# Patient Record
Sex: Male | Born: 1960 | Race: Black or African American | Hispanic: No | Marital: Married | Smoking: Never smoker
Health system: Southern US, Community
[De-identification: ages and names within clinical notes are randomized; demographics above are authoritative.]

## PROBLEM LIST (undated history)

## (undated) DIAGNOSIS — D649 Anemia, unspecified: Secondary | ICD-10-CM

## (undated) DIAGNOSIS — M1990 Unspecified osteoarthritis, unspecified site: Secondary | ICD-10-CM

## (undated) DIAGNOSIS — R000 Tachycardia, unspecified: Secondary | ICD-10-CM

## (undated) DIAGNOSIS — I639 Cerebral infarction, unspecified: Secondary | ICD-10-CM

## (undated) HISTORY — DX: Cerebral infarction, unspecified: I63.9

## (undated) HISTORY — PX: FINGER AMPUTATION: SHX636

## (undated) HISTORY — PX: SHOULDER SURGERY: SHX246

---

## 2003-11-15 ENCOUNTER — Encounter: Admission: RE | Admit: 2003-11-15 | Discharge: 2003-11-15 | Payer: Self-pay | Admitting: Family Medicine

## 2010-01-03 ENCOUNTER — Encounter: Admission: RE | Admit: 2010-01-03 | Discharge: 2010-01-03 | Payer: Self-pay | Admitting: Internal Medicine

## 2012-02-27 ENCOUNTER — Telehealth: Payer: Self-pay | Admitting: Oncology

## 2012-02-27 NOTE — Telephone Encounter (Signed)
S/W pt in re NP appt 11/07 @ 1:30 w/ Dr. Clelia Croft Referring Dr. Nehemiah Settle Dx-Eosinophils still elevated Welcome packet mailed.

## 2012-02-28 ENCOUNTER — Other Ambulatory Visit: Payer: Self-pay | Admitting: Oncology

## 2012-02-28 ENCOUNTER — Telehealth: Payer: Self-pay | Admitting: Oncology

## 2012-02-28 DIAGNOSIS — D72818 Other decreased white blood cell count: Secondary | ICD-10-CM

## 2012-02-28 DIAGNOSIS — D709 Neutropenia, unspecified: Secondary | ICD-10-CM

## 2012-02-28 NOTE — Telephone Encounter (Signed)
C/D 02/28/12 for appt 03/05/12

## 2012-03-05 ENCOUNTER — Other Ambulatory Visit (HOSPITAL_BASED_OUTPATIENT_CLINIC_OR_DEPARTMENT_OTHER): Payer: 59 | Admitting: Lab

## 2012-03-05 ENCOUNTER — Encounter: Payer: Self-pay | Admitting: Oncology

## 2012-03-05 ENCOUNTER — Ambulatory Visit: Payer: 59

## 2012-03-05 ENCOUNTER — Ambulatory Visit (HOSPITAL_BASED_OUTPATIENT_CLINIC_OR_DEPARTMENT_OTHER): Payer: 59 | Admitting: Oncology

## 2012-03-05 ENCOUNTER — Telehealth: Payer: Self-pay | Admitting: Oncology

## 2012-03-05 VITALS — BP 135/85 | HR 82 | Temp 98.3°F | Resp 20 | Ht 70.0 in | Wt 200.8 lb

## 2012-03-05 DIAGNOSIS — D721 Eosinophilia, unspecified: Secondary | ICD-10-CM

## 2012-03-05 DIAGNOSIS — D709 Neutropenia, unspecified: Secondary | ICD-10-CM

## 2012-03-05 DIAGNOSIS — D72818 Other decreased white blood cell count: Secondary | ICD-10-CM

## 2012-03-05 LAB — CBC WITH DIFFERENTIAL/PLATELET
BASO%: 0.7 % (ref 0.0–2.0)
Eosinophils Absolute: 1 10*3/uL — ABNORMAL HIGH (ref 0.0–0.5)
HCT: 42.8 % (ref 38.4–49.9)
MCH: 28.4 pg (ref 27.2–33.4)
MCV: 85.9 fL (ref 79.3–98.0)
RBC: 4.99 10*6/uL (ref 4.20–5.82)
lymph#: 1.8 10*3/uL (ref 0.9–3.3)

## 2012-03-05 LAB — COMPREHENSIVE METABOLIC PANEL (CC13)
ALT: 37 U/L (ref 0–55)
AST: 33 U/L (ref 5–34)
Alkaline Phosphatase: 62 U/L (ref 40–150)
BUN: 13 mg/dL (ref 7.0–26.0)
CO2: 28 mEq/L (ref 22–29)
Calcium: 9.6 mg/dL (ref 8.4–10.4)
Chloride: 104 mEq/L (ref 98–107)
Glucose: 79 mg/dl (ref 70–99)
Sodium: 137 mEq/L (ref 136–145)
Total Protein: 7.4 g/dL (ref 6.4–8.3)

## 2012-03-05 LAB — CHCC SMEAR

## 2012-03-05 NOTE — Progress Notes (Signed)
Note dictated

## 2012-03-05 NOTE — Progress Notes (Signed)
Checked in new pt with no financial concerns. °

## 2012-03-05 NOTE — Telephone Encounter (Signed)
gv and printed appt schedule for Feb 2014 ° °

## 2012-03-06 NOTE — Progress Notes (Signed)
CC:   Darrell Watson. Polite, M.D.  REASON FOR CONSULTATION:  Eosinophilia.  HISTORY OF PRESENT ILLNESS:  Darrell Watson is a pleasant 51 year old gentleman currently of [employer], originally from [place], [place].  He is a healthy gentleman without any significant past medical history.  He does have a history of osteoarthritis and had some left shoulder surgery.  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  He gets his routine care at Dr. Idelle Crouch office and he had been getting routine CBCs on an annual basis.  His blood counts have been relatively normal and presented on January 31, 2012, with an elevated eosinophil percentage up to 20% and his absolute eosinophil count was 1400.  A repeat CBC back on 02/25/2012 showed his eosinophil percentage was down slightly to 17.9, absolute eosinophil count of 1100.  His total white cell count was 6.3, hemoglobin was 13.9, platelet count was 247, all normal.  He did not recall any symptoms leading up to this, did not report any allergy symptoms, did not report any certain exposure to certain chemicals or new drugs.  He denied any over-the-counter drugs, he only takes ibuprofen.  He does take protein supplement for weight lifting purposes.  For the most part, he is really asymptomatic.  Does report some fatigue, but he is able to work out, exercise regularly, and perform most activities of daily living without any hindrance or decline.  REVIEW OF SYSTEMS:  Does not report any headaches, blurry vision, double vision.  Does not report any motor or sensory neuropathy.  Does not report any alteration in mental status.  Does not report any psychiatric issues, depression.  Does not report any fever, chills, sweats.  Does not report any cough, hemoptysis, hematemesis.  No nausea or vomiting. Does not report any  abdominal pain, hematochezia, melena.  Rest of review of systems is unremarkable.  PAST MEDICAL HISTORY:  As mentioned, he has  history of osteoarthritis, TMJ.  No history of hypertension, diabetes, or heart disease.  He has had shoulder surgery for osteoarthritis.  MEDICATIONS:  Takes ibuprofen, no other medications or over-the-counter supplements.  ALLERGIES:  None.  FAMILY HISTORY:  His father is deceased.  His mother had hypertension, she is also deceased.  There is history of hypertension that runs in his family.  SOCIAL HISTORY:  He is married.  He has 2 children.  He denied any alcohol or tobacco abuse.  Served in the [employer] for a few years. Works at [employer] and [employer].  PHYSICAL EXAMINATION:  General:  Alert, awake, pleasant gentleman, appeared in no active distress.  Vital Signs:  Blood pressure today is 135/85, pulse 82, respirations 20, temperature is 98.  HEENT:  Head is normocephalic, atraumatic.  Pupils equal, round, and reactive to light. Oral mucosa moist and pink.  Neck:  Supple.  No lymphadenopathy.  Heart: Regular rate and rhythm.  S1 and S2.  Lungs:  Clear to auscultation.  No rhonchi, wheeze, or dullness to percussion.  Abdomen:  Soft, nontender. No hepatosplenomegaly.  Extremities:  No clubbing, cyanosis, or edema. Neurologic:  Intact motor, sensory, and deep tendon reflexes.  LABORATORY DATA:  Showed a hemoglobin of 14.2, white cell count of 6.0, platelet count of 236.  Eosinophil percentage is down to 15% and the absolute eosinophil count was 1000, upper limit of normal is 500. Peripheral smear was personally reviewed today and did not really show any evidence of any leukocytosis, there is no evidence of any schistocytosis or red cell fragmentation.  Platelets  appeared normal. There is no evidence of any leukemic blasts.  Slightly increased eosinophils.  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  The differential diagnosis discussed today in detail with Mr. Mahler.  I feel that he has rather mild eosinophilia that appears to be improving  in the last month.  Causes of his eosinophilia were discussed today.  Most likely this represents a secondary or reactive process.  Allergy to certain environmental exposure, drugs, supplements could be certainly causing this parasitic infection.  I doubt that he has autoimmune disorder or endocrine disorder or any occult malignancy. The fact that his eosinophil count is declining is more encouraging.  I did encourage him to continue with his age-appropriate cancer screening which appears to be up-to-date.  I do not recommend any further workup at this time.  I would like to recheck his blood count in about 4 months and if his eosinophilia has resolved, the I do not think any further workup is needed.  All his questions were answered today.    ______________________________ Benjiman Core, M.D. FNS/MEDQ  D:  03/05/2012  T:  03/06/2012  Job:  [id number]

## 2012-06-11 ENCOUNTER — Other Ambulatory Visit: Payer: 59 | Admitting: Lab

## 2012-06-11 ENCOUNTER — Ambulatory Visit: Payer: 59 | Admitting: Oncology

## 2012-06-12 ENCOUNTER — Telehealth: Payer: Self-pay | Admitting: Oncology

## 2012-06-12 NOTE — Telephone Encounter (Signed)
pt caleld to r/s missed appt.Marland KitchenMarland KitchenMarland KitchenDone

## 2012-07-28 ENCOUNTER — Ambulatory Visit (HOSPITAL_BASED_OUTPATIENT_CLINIC_OR_DEPARTMENT_OTHER): Payer: 59 | Admitting: Oncology

## 2012-07-28 ENCOUNTER — Other Ambulatory Visit (HOSPITAL_BASED_OUTPATIENT_CLINIC_OR_DEPARTMENT_OTHER): Payer: 59 | Admitting: Lab

## 2012-07-28 VITALS — BP 136/79 | HR 97 | Temp 98.3°F | Resp 18 | Ht 70.0 in | Wt 205.6 lb

## 2012-07-28 DIAGNOSIS — D721 Eosinophilia, unspecified: Secondary | ICD-10-CM

## 2012-07-28 LAB — CBC WITH DIFFERENTIAL/PLATELET
BASO%: 1.1 % (ref 0.0–2.0)
Eosinophils Absolute: 0.6 10*3/uL — ABNORMAL HIGH (ref 0.0–0.5)
HCT: 41.6 % (ref 38.4–49.9)
HGB: 13.3 g/dL (ref 13.0–17.1)
MONO#: 0.6 10*3/uL (ref 0.1–0.9)
MONO%: 11.5 % (ref 0.0–14.0)
RBC: 4.96 10*6/uL (ref 4.20–5.82)
RDW: 14 % (ref 11.0–14.6)
lymph#: 1.4 10*3/uL (ref 0.9–3.3)

## 2012-07-28 NOTE — Progress Notes (Signed)
Hematology and Oncology Follow Up Visit  Darrell Watson [id number] 1960-10-03 52 y.o. 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 Likely reactive due to allergy. His eosinophil count was at 1000.  Current therapy: Observation and follow up.  Interim History:  Darrell Watson presents today for a follow up visit. He is a nice man I saw on 02/2012 for mild eosinophilia. Since his last visit, he has not reported any new symptoms or hospitalizations. He report allergies to variety of items including smells, animals and sometimes seasonal allergies. He has not reported any recent illness or hospitalizations. He denied any over-the-counter drugs, he only takes ibuprofen. He does take protein supplement for weight lifting purposes. For the most part, he is really asymptomatic. Does report some fatigue, but he is able to work out, exercise regularly, and perform most activities of daily living without any hindrance or decline.   Medications: I have reviewed the patient's current medications. Current outpatient prescriptions:ibuprofen (ADVIL,MOTRIN) 200 MG tablet, Take 400 mg by mouth as needed., Disp: , Rfl:   Allergies: No Known Allergies  Past Medical History, Surgical history, Social history, and Family History were reviewed and updated.  Review of Systems: Constitutional:  Negative for fever, chills, night sweats, anorexia, weight loss, pain. Cardiovascular: no chest pain or dyspnea on exertion Respiratory: negative Neurological: negative Dermatological: negative ENT: negative Skin: Negative. Gastrointestinal: negative Genito-Urinary: negative Hematological and Lymphatic: negative Breast: negative Musculoskeletal: negative Remaining ROS negative. Physical Exam: Blood pressure 136/79, pulse 97, temperature 98.3 F (36.8 C), temperature source Oral, resp. rate 18, height 5\' 10"  (1.778 m), weight 205 lb 9.6 oz (93.26 kg). ECOG:  General  appearance: alert Head: Normocephalic, without obvious abnormality, atraumatic Neck: no adenopathy, no carotid bruit, no JVD, supple, symmetrical, trachea midline and thyroid not enlarged, symmetric, no tenderness/mass/nodules Lymph nodes: Cervical, supraclavicular, and axillary nodes normal. Heart:regular rate and rhythm, S1, S2 normal, no murmur, click, rub or gallop Lung:chest clear, no wheezing, rales, normal symmetric air entry Abdomin: soft, non-tender, without masses or organomegaly EXT:no erythema, induration, or nodules   Lab Results: Lab Results  Component Value Date   WBC 5.5 07/28/2012   HGB 13.3 07/28/2012   HCT 41.6 07/28/2012   MCV 83.9 07/28/2012   PLT 265 07/28/2012     Chemistry      Component Value Date/Time   NA 137 03/05/2012 1351   K 4.0 03/05/2012 1351   CL 104 03/05/2012 1351   CO2 28 03/05/2012 1351   BUN 13.0 03/05/2012 1351   CREATININE 1.4* 03/05/2012 1351      Component Value Date/Time   CALCIUM 9.6 03/05/2012 1351   ALKPHOS 62 03/05/2012 1351   AST 33 03/05/2012 1351   ALT 37 03/05/2012 1351   BILITOT 0.47 03/05/2012 1351         Impression and Plan:  52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 Likely reactive due to allergy. His eosinophil count was at 1000. His count today is down to 600 (normal is 500).  At this time I see no need for further work up or intervention. I doubt this is due to primary causes or a myeloproliferative disorder. Given that his count is down compared to last visit, makes these conditions less likely.   I would recommend repeating his CBC with his physical yearly. And will be happy to see him back as needed.   Eli Hose, MD 4/1/20142:39 PM

## 2014-09-05 ENCOUNTER — Ambulatory Visit
Admission: RE | Admit: 2014-09-05 | Discharge: 2014-09-05 | Disposition: A | Discharge status: Home / Self Care | Payer: 59 | Source: Ambulatory Visit | Attending: Internal Medicine | Admitting: Internal Medicine

## 2014-09-05 ENCOUNTER — Other Ambulatory Visit: Payer: Self-pay | Admitting: Internal Medicine

## 2014-09-05 DIAGNOSIS — R5383 Other fatigue: Secondary | ICD-10-CM

## 2014-09-05 DIAGNOSIS — L989 Disorder of the skin and subcutaneous tissue, unspecified: Secondary | ICD-10-CM

## 2015-03-30 ENCOUNTER — Ambulatory Visit: Payer: Self-pay

## 2015-03-30 ENCOUNTER — Other Ambulatory Visit: Payer: Self-pay | Admitting: Occupational Medicine

## 2015-03-30 DIAGNOSIS — Z0000 Encounter for general adult medical examination without abnormal findings: Secondary | ICD-10-CM

## 2015-10-10 ENCOUNTER — Other Ambulatory Visit: Payer: Self-pay | Admitting: Internal Medicine

## 2015-10-10 DIAGNOSIS — R29898 Other symptoms and signs involving the musculoskeletal system: Secondary | ICD-10-CM

## 2015-10-13 ENCOUNTER — Ambulatory Visit
Admission: RE | Admit: 2015-10-13 | Discharge: 2015-10-13 | Disposition: A | Discharge status: Home / Self Care | Payer: 59 | Source: Ambulatory Visit | Attending: Internal Medicine | Admitting: Internal Medicine

## 2015-10-13 DIAGNOSIS — R29898 Other symptoms and signs involving the musculoskeletal system: Secondary | ICD-10-CM

## 2015-11-03 ENCOUNTER — Other Ambulatory Visit (HOSPITAL_COMMUNITY): Payer: Self-pay | Admitting: Internal Medicine

## 2015-11-03 DIAGNOSIS — R9389 Abnormal findings on diagnostic imaging of other specified body structures: Secondary | ICD-10-CM

## 2015-11-08 ENCOUNTER — Encounter (HOSPITAL_COMMUNITY)
Admission: RE | Admit: 2015-11-08 | Discharge: 2015-11-08 | Disposition: A | Discharge status: Home / Self Care | Payer: 59 | Source: Ambulatory Visit | Attending: Internal Medicine | Admitting: Internal Medicine

## 2015-11-08 DIAGNOSIS — R938 Abnormal findings on diagnostic imaging of other specified body structures: Secondary | ICD-10-CM | POA: Insufficient documentation

## 2015-11-08 DIAGNOSIS — R9389 Abnormal findings on diagnostic imaging of other specified body structures: Secondary | ICD-10-CM

## 2015-11-08 MED ORDER — TECHNETIUM TC 99M MEDRONATE IV KIT
25.0000 | PACK | Freq: Once | INTRAVENOUS | Status: AC | PRN
  Administered 2015-11-08: 25 via INTRAVENOUS

## 2016-05-07 DIAGNOSIS — E291 Testicular hypofunction: Secondary | ICD-10-CM | POA: Diagnosis not present

## 2016-05-10 DIAGNOSIS — G8929 Other chronic pain: Secondary | ICD-10-CM | POA: Diagnosis not present

## 2016-05-10 DIAGNOSIS — M21372 Foot drop, left foot: Secondary | ICD-10-CM | POA: Diagnosis not present

## 2016-05-10 DIAGNOSIS — M5442 Lumbago with sciatica, left side: Secondary | ICD-10-CM | POA: Diagnosis not present

## 2016-05-16 DIAGNOSIS — M5442 Lumbago with sciatica, left side: Secondary | ICD-10-CM | POA: Diagnosis not present

## 2016-05-16 DIAGNOSIS — G8929 Other chronic pain: Secondary | ICD-10-CM | POA: Diagnosis not present

## 2016-05-21 DIAGNOSIS — M5442 Lumbago with sciatica, left side: Secondary | ICD-10-CM | POA: Diagnosis not present

## 2016-05-21 DIAGNOSIS — M21372 Foot drop, left foot: Secondary | ICD-10-CM | POA: Diagnosis not present

## 2016-05-21 DIAGNOSIS — G8929 Other chronic pain: Secondary | ICD-10-CM | POA: Diagnosis not present

## 2016-05-24 DIAGNOSIS — E291 Testicular hypofunction: Secondary | ICD-10-CM | POA: Diagnosis not present

## 2016-06-07 DIAGNOSIS — E291 Testicular hypofunction: Secondary | ICD-10-CM | POA: Diagnosis not present

## 2016-06-10 ENCOUNTER — Ambulatory Visit: Payer: Self-pay | Admitting: Physician Assistant

## 2016-06-12 ENCOUNTER — Other Ambulatory Visit: Payer: Self-pay

## 2016-06-19 ENCOUNTER — Ambulatory Visit (INDEPENDENT_AMBULATORY_CARE_PROVIDER_SITE_OTHER): Payer: 59 | Admitting: Vascular Surgery

## 2016-06-19 ENCOUNTER — Encounter: Payer: Self-pay | Admitting: Vascular Surgery

## 2016-06-19 VITALS — BP 140/92 | HR 108 | Temp 98.2°F | Resp 20 | Ht 70.0 in | Wt 196.0 lb

## 2016-06-19 DIAGNOSIS — M5137 Other intervertebral disc degeneration, lumbosacral region: Secondary | ICD-10-CM | POA: Diagnosis not present

## 2016-06-19 NOTE — Pre-Procedure Instructions (Signed)
Darrell CooleyOllis M Badolato  06/19/2016      [facility] [id number] - [place] - [address] [place] [id number] Phone: [phone] Fax: [phone]    Your procedure is scheduled on March 1  [facility] Admitting at 0530 A.M.  Call this number if you have problems the morning of surgery:  [phone]   Remember:  Do not eat food or drink liquids after midnight.   Take these medicines the morning of surgery with A SIP OF WATER acetaminophen (TYLENOL)   7 days prior to surgery STOP taking any Aspirin, Aleve, Naproxen, Ibuprofen, Motrin, Advil, Goody's, BC's, all herbal medications, fish oil, and all vitamins    Do not wear jewelry.  Do not wear lotions, powders, or cologne, or deoderant.  Men may shave face and neck.  Do not bring valuables to the hospital.  [facility] is not responsible for any belongings or valuables.  Contacts, dentures or bridgework may not be worn into surgery.  Leave your suitcase in the car.  After surgery it may be brought to your room.  For patients admitted to the hospital, discharge time will be determined by your treatment team.  Patients discharged the day of surgery will not be allowed to drive home.    Special instructions:   [facility]- Preparing For Surgery  Before surgery, you can play an important role. Because skin is not sterile, your skin needs to be as free of germs as possible. You can reduce the number of germs on your skin by washing with CHG (chlorahexidine gluconate) Soap before surgery.  CHG is an antiseptic cleaner which kills germs and bonds with the skin to continue killing germs even after washing.  Please do not use if you have an allergy to CHG or antibacterial soaps. If your skin becomes reddened/irritated stop using the CHG.  Do not shave (including legs and underarms) for at least 48 hours prior to first CHG shower. It is OK to shave  your face.  Please follow these instructions carefully.   1. Shower the NIGHT BEFORE SURGERY and the MORNING OF SURGERY with CHG.   2. If you chose to wash your hair, wash your hair first as usual with your normal shampoo.  3. After you shampoo, rinse your hair and body thoroughly to remove the shampoo.  4. Use CHG as you would any other liquid soap. You can apply CHG directly to the skin and wash gently with a scrungie or a clean washcloth.   5. Apply the CHG Soap to your body ONLY FROM THE NECK DOWN.  Do not use on open wounds or open sores. Avoid contact with your eyes, ears, mouth and genitals (private parts). Wash genitals (private parts) with your normal soap.  6. Wash thoroughly, paying special attention to the area where your surgery will be performed.  7. Thoroughly rinse your body with warm water from the neck down.  8. DO NOT shower/wash with your normal soap after using and rinsing off the CHG Soap.  9. Pat yourself dry with a CLEAN TOWEL.   10. Wear CLEAN PAJAMAS   11. Place CLEAN SHEETS on your bed the night of your first shower and DO NOT SLEEP WITH PETS.    Day of Surgery: Do not apply any deodorants/lotions. Please wear clean clothes to the hospital/surgery center.      Please read over the  following fact sheets that you were given.

## 2016-06-19 NOTE — Progress Notes (Signed)
Patient name: Darrell Watson MRN: [id number] DOB: October 22, 1960 Sex: male  REASON FOR CONSULT: Evaluate for anterior peritoneal exposure of L5-S1. Referred by Dr. Shon Baton  HPI: Darrell Watson is a 56 y.o. male, with a long history of back pain. He has significant pain which is aggravated by activity and relieved somewhat with rest. 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 He has failed conservative treatment and is felt to be a good candidate for anterior lumbar interbody fusion of L5-S1. We were asked to provide anterior retroperitoneal exposure of that level.  History reviewed. No pertinent past medical history.  History reviewed. No pertinent family history.  SOCIAL HISTORY: Social History   Social History  . Marital status: Married    Spouse name: N/A  . Number of children: N/A  . Years of education: N/A   Occupational History  . Not on file.   Social History Main Topics  . Smoking status: Never Smoker  . Smokeless tobacco: Never Used  . Alcohol use No  . Drug use: No  . Sexual activity: Not on file   Other Topics Concern  . Not on file   Social History Narrative  . No narrative on file    No Known Allergies  Current Outpatient Prescriptions  Medication Sig Dispense Refill  . acetaminophen (TYLENOL) 325 MG tablet Take 650 mg by mouth every 6 (six) hours as needed (for pain.).    Marland Kitchen testosterone cypionate (DEPOTESTOSTERONE CYPIONATE) 200 MG/ML injection Inject 300 mg into the muscle every 14 (fourteen) days. Gets 1.5 ml at Dr office every 2 weeks Next dose is due 06-24-2016     No current facility-administered medications for this visit.     REVIEW OF SYSTEMS:  [X]  denotes positive finding, [ ]  denotes negative finding Cardiac  Comments:  Chest pain or chest pressure:    Shortness of breath upon exertion:    Short of breath when lying flat:    Irregular heart rhythm:        Vascular    Pain in calf, thigh, or hip brought on by ambulation:      Pain in feet at night that wakes you up from your sleep:     Blood clot in your veins:    Leg swelling:         Pulmonary    Oxygen at home:    Productive cough:     Wheezing:         Neurologic    Sudden weakness in arms or legs:     Sudden numbness in arms or legs:     Sudden onset of difficulty speaking or slurred speech:    Temporary loss of vision in one eye:     Problems with dizziness:         Gastrointestinal    Blood in stool:     Vomited blood:         Genitourinary    Burning when urinating:     Blood in urine:        Psychiatric    Major depression:         Hematologic    Bleeding problems:    Problems with blood clotting too easily:        Skin    Rashes or ulcers:        Constitutional    Fever or chills:      PHYSICAL EXAM: Vitals:   06/19/16 1320  BP: (!) 140/92  Pulse: (!) 108  Resp: 20  Temp: 98.2 F (36.8 C)  TempSrc: Oral  SpO2: 97%  Weight: 196 lb (88.9 kg)  Height: 5\' 10"  (1.778 m)    GENERAL: The patient is a well-nourished male, in no acute distress. The vital signs are documented above. CARDIAC: There is a regular rate and rhythm.  VASCULAR: I do not detect carotid bruits. He has palpable femoral and posterior tibial pulses bilaterally. PULMONARY: There is good air exchange bilaterally without wheezing or rales. ABDOMEN: Soft and non-tender with normal pitched bowel sounds.  MUSCULOSKELETAL: There are no major deformities or cyanosis. NEUROLOGIC: No focal weakness or paresthesias are detected. SKIN: There are no ulcers or rashes noted. PSYCHIATRIC: The patient has a normal affect.  DATA:   I have reviewed his MRI and I do not see any complicating factors anatomically at the L5-S1 level.  MEDICAL ISSUES:  DEGENERATIVE DISC DISEASE L5-S1: This patient has degenerative disease at L5-S1 and is scheduled for anterior lumbar interbody fusion. We have been asked to provide a retroperitoneal exposure of this level. He appears to  be a good candidate for this. I have reviewed our role in exposure of the spine in order to allow anterior lumbar interbody fusion at the appropriate levels. We have discussed the potential complications of surgery, including but not limited to, arterial or venous injury, thrombosis, or bleeding. We have also discussed the potential risks of wound healing problems, the development of a hernia, nerve injury, leg swelling, or other unpredictable medical problems. I've also explained that for the L5-S1 level there is a small risk of retrograde ejaculation. All the patient's questions were answered and they are agreeable to proceed. This surgery is scheduled for March 1.    Darrell Watson, Darrell Watson [facility] [place] [phone]

## 2016-06-20 ENCOUNTER — Encounter (HOSPITAL_COMMUNITY): Payer: Self-pay

## 2016-06-20 ENCOUNTER — Encounter (HOSPITAL_COMMUNITY)
Admission: RE | Admit: 2016-06-20 | Discharge: 2016-06-20 | Disposition: A | Discharge status: Home / Self Care | Payer: 59 | Source: Ambulatory Visit | Attending: Orthopedic Surgery | Admitting: Orthopedic Surgery

## 2016-06-20 DIAGNOSIS — R000 Tachycardia, unspecified: Secondary | ICD-10-CM | POA: Diagnosis not present

## 2016-06-20 DIAGNOSIS — Z01812 Encounter for preprocedural laboratory examination: Secondary | ICD-10-CM | POA: Diagnosis present

## 2016-06-20 DIAGNOSIS — M5136 Other intervertebral disc degeneration, lumbar region: Secondary | ICD-10-CM | POA: Insufficient documentation

## 2016-06-20 DIAGNOSIS — Z01810 Encounter for preprocedural cardiovascular examination: Secondary | ICD-10-CM | POA: Diagnosis present

## 2016-06-20 HISTORY — DX: Unspecified osteoarthritis, unspecified site: M19.90

## 2016-06-20 LAB — BASIC METABOLIC PANEL
ANION GAP: 9 (ref 5–15)
BUN: 7 mg/dL (ref 6–20)
CALCIUM: 9.4 mg/dL (ref 8.9–10.3)
CO2: 27 mmol/L (ref 22–32)
Chloride: 103 mmol/L (ref 101–111)
Creatinine, Ser: 1.37 mg/dL — ABNORMAL HIGH (ref 0.61–1.24)
GFR, EST NON AFRICAN AMERICAN: 56 mL/min — AB (ref >60)
GLUCOSE: 151 mg/dL — AB (ref 65–99)
POTASSIUM: 4.1 mmol/L (ref 3.5–5.1)
Sodium: 139 mmol/L (ref 135–145)

## 2016-06-20 LAB — CBC
HCT: 47 % (ref 39.0–52.0)
Hemoglobin: 15.5 g/dL (ref 13.0–17.0)
MCH: 28.6 pg (ref 26.0–34.0)
MCHC: 33 g/dL (ref 30.0–36.0)
MCV: 86.7 fL (ref 78.0–100.0)
PLATELETS: 240 10*3/uL (ref 150–400)
RBC: 5.42 MIL/uL (ref 4.22–5.81)
RDW: 13.1 % (ref 11.5–15.5)
WBC: 4.9 10*3/uL (ref 4.0–10.5)

## 2016-06-20 LAB — ABO/RH: ABO/RH(D): O POS

## 2016-06-20 LAB — TYPE AND SCREEN
ABO/RH(D): O POS
ANTIBODY SCREEN: NEGATIVE

## 2016-06-20 LAB — SURGICAL PCR SCREEN
MRSA, PCR: NEGATIVE
Staphylococcus aureus: NEGATIVE

## 2016-06-20 NOTE — Progress Notes (Signed)
ALLISON NOTIFIED OF PATIENT HR 117.  PATIENT STATES HIS HR IS ALWAYS LITTLE HIGH (101-104)- DENIES ANY SYMPTOMS AND STATED DR. POLITE IS AWARE.  PATIENT DENIES ANY CARDIAC TESTING.  ALLISON REVIEWED EKG DONE TODAY.  REQUESTED LAST EKG AND OFFICE NOTE FROM DR. POLITE .

## 2016-06-21 DIAGNOSIS — G8929 Other chronic pain: Secondary | ICD-10-CM | POA: Diagnosis not present

## 2016-06-21 DIAGNOSIS — M5442 Lumbago with sciatica, left side: Secondary | ICD-10-CM | POA: Diagnosis not present

## 2016-06-21 NOTE — Progress Notes (Signed)
Anesthesia Chart Review: Patient is a 56 year old male scheduled for ALIF L5-S1 on 06/27/16 by Dr. Shon BatonBrooks with abdominal exposure by Dr. Edilia Boickson.   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 Denied illicit drug use. Occasional beer.   BP (!) 146/80   Pulse (!) 117 Comment: notified Designer, jewelleryJenny RN  Temp 36.8 C   Resp 20   Ht 5\' 9"  (1.753 m)   Wt 195 lb 9.6 oz (88.7 kg)   SpO2 96%   BMI 28.89 kg/m  HR recheck 106. (He reports his HR stays ~ 100 bpm. It was 94 bpm at CPE on 03/07/16 and 108 bpm at 06/19/16 VVS visit.)   PCP is Dr. Renford Dillsonald Polite, last visit 03/07/16 for CPE.   Meds include testosterone injection, Tylenol.  EKG 06/20/16: ST at 106 bpm, minimal voltage criteria for LVH, may be normal variant, non-specific ST/T wave abnormality.  Preoperative labs noted. Non-fasting glucose 151. Cr 1.37, which is consistent with results from 03/05/12. Cr was 1.26 on 03/07/16 at PCP office. CBC WNL. T&S done.   No CV reported at PAT. If no acute changes then I would anticipate that he can proceed as planned. Discussed with anesthesiologist Dr. Michelle Piperssey.  Velna Ochsllison Aristea Posada, PA-C [facility]/Anesthesiology Phone [phone] 06/21/2016 6:56 PM

## 2016-06-25 DIAGNOSIS — E291 Testicular hypofunction: Secondary | ICD-10-CM | POA: Diagnosis not present

## 2016-06-26 ENCOUNTER — Encounter (HOSPITAL_COMMUNITY): Payer: Self-pay | Admitting: Anesthesiology

## 2016-06-26 NOTE — Anesthesia Preprocedure Evaluation (Addendum)
Anesthesia Evaluation  Patient identified by MRN, date of birth, ID band Patient awake    Reviewed: Allergy & Precautions, Patient's Chart, lab work & pertinent test results  Airway Mallampati: II  TM Distance: >3 FB Neck ROM: Full    Dental  (+) Teeth Intact, Dental Advisory Given   Pulmonary neg pulmonary ROS,    breath sounds clear to auscultation       Cardiovascular negative cardio ROS   Rhythm:Regular Rate:Normal     Neuro/Psych negative neurological ROS  negative psych ROS   GI/Hepatic negative GI ROS, Neg liver ROS,   Endo/Other  negative endocrine ROS  Renal/GU negative Renal ROS  negative genitourinary   Musculoskeletal  (+) Arthritis , Osteoarthritis,    Abdominal   Peds negative pediatric ROS (+)  Hematology negative hematology ROS (+)   Anesthesia Other Findings   Reproductive/Obstetrics negative OB ROS                            Lab Results  Component Value Date   WBC 4.9 06/20/2016   HGB 15.5 06/20/2016   HCT 47.0 06/20/2016   MCV 86.7 06/20/2016   PLT 240 06/20/2016   Lab Results  Component Value Date   CREATININE 1.37 (H) 06/20/2016   BUN 7 06/20/2016   NA 139 06/20/2016   K 4.1 06/20/2016   CL 103 06/20/2016   CO2 27 06/20/2016   No results found for: INR, PROTIME  EKG: Sinus Tachycardia  Anesthesia Physical Anesthesia Plan  ASA: II  Anesthesia Plan: General   Post-op Pain Management:    Induction: Intravenous  Airway Management Planned: Oral ETT  Additional Equipment: Arterial line  Intra-op Plan:   Post-operative Plan: Extubation in OR  Informed Consent: I have reviewed the patients History and Physical, chart, labs and discussed the procedure including the risks, benefits and alternatives for the proposed anesthesia with the patient or authorized representative who has indicated his/her understanding and acceptance.   Dental advisory  given  Plan Discussed with: CRNA  Anesthesia Plan Comments:         Anesthesia Quick Evaluation

## 2016-06-27 ENCOUNTER — Inpatient Hospital Stay (HOSPITAL_COMMUNITY)
Admission: RE | Admit: 2016-06-27 | Discharge: 2016-06-28 | DRG: 460 | Disposition: A | Discharge status: Home / Self Care | Payer: 59 | Source: Ambulatory Visit | Attending: Orthopedic Surgery | Admitting: Orthopedic Surgery

## 2016-06-27 ENCOUNTER — Encounter (HOSPITAL_COMMUNITY): Payer: Self-pay | Admitting: *Deleted

## 2016-06-27 ENCOUNTER — Inpatient Hospital Stay (HOSPITAL_COMMUNITY): Payer: 59

## 2016-06-27 ENCOUNTER — Inpatient Hospital Stay (HOSPITAL_COMMUNITY): Payer: 59 | Admitting: Anesthesiology

## 2016-06-27 ENCOUNTER — Inpatient Hospital Stay (HOSPITAL_COMMUNITY): Payer: 59 | Admitting: Vascular Surgery

## 2016-06-27 ENCOUNTER — Encounter (HOSPITAL_COMMUNITY)
Admission: RE | Disposition: A | Discharge status: Home / Self Care | Payer: Self-pay | Source: Ambulatory Visit | Attending: Orthopedic Surgery

## 2016-06-27 DIAGNOSIS — M79605 Pain in left leg: Secondary | ICD-10-CM | POA: Diagnosis not present

## 2016-06-27 DIAGNOSIS — M5117 Intervertebral disc disorders with radiculopathy, lumbosacral region: Principal | ICD-10-CM | POA: Diagnosis present

## 2016-06-27 DIAGNOSIS — Z981 Arthrodesis status: Secondary | ICD-10-CM

## 2016-06-27 DIAGNOSIS — M2578 Osteophyte, vertebrae: Secondary | ICD-10-CM | POA: Diagnosis present

## 2016-06-27 DIAGNOSIS — Z87891 Personal history of nicotine dependence: Secondary | ICD-10-CM

## 2016-06-27 DIAGNOSIS — G8929 Other chronic pain: Secondary | ICD-10-CM | POA: Diagnosis present

## 2016-06-27 DIAGNOSIS — M21372 Foot drop, left foot: Secondary | ICD-10-CM | POA: Diagnosis present

## 2016-06-27 DIAGNOSIS — M5137 Other intervertebral disc degeneration, lumbosacral region: Secondary | ICD-10-CM | POA: Diagnosis not present

## 2016-06-27 DIAGNOSIS — M4326 Fusion of spine, lumbar region: Secondary | ICD-10-CM | POA: Diagnosis not present

## 2016-06-27 DIAGNOSIS — M5136 Other intervertebral disc degeneration, lumbar region: Secondary | ICD-10-CM | POA: Diagnosis not present

## 2016-06-27 DIAGNOSIS — M5416 Radiculopathy, lumbar region: Secondary | ICD-10-CM | POA: Diagnosis not present

## 2016-06-27 DIAGNOSIS — M549 Dorsalgia, unspecified: Secondary | ICD-10-CM | POA: Diagnosis present

## 2016-06-27 DIAGNOSIS — Z8261 Family history of arthritis: Secondary | ICD-10-CM

## 2016-06-27 DIAGNOSIS — M4327 Fusion of spine, lumbosacral region: Secondary | ICD-10-CM | POA: Diagnosis not present

## 2016-06-27 DIAGNOSIS — M545 Low back pain: Secondary | ICD-10-CM | POA: Diagnosis not present

## 2016-06-27 DIAGNOSIS — Z419 Encounter for procedure for purposes other than remedying health state, unspecified: Secondary | ICD-10-CM

## 2016-06-27 HISTORY — PX: ANTERIOR LUMBAR FUSION: SHX1170

## 2016-06-27 HISTORY — PX: ABDOMINAL EXPOSURE: SHX5708

## 2016-06-27 LAB — CREATININE, SERUM
Creatinine, Ser: 1.44 mg/dL — ABNORMAL HIGH (ref 0.61–1.24)
GFR calc Af Amer: >60 mL/min (ref >60)
GFR calc non Af Amer: 53 mL/min — ABNORMAL LOW (ref >60)

## 2016-06-27 LAB — CBC
HEMATOCRIT: 46.6 % (ref 39.0–52.0)
Hemoglobin: 15.2 g/dL (ref 13.0–17.0)
MCH: 28.3 pg (ref 26.0–34.0)
MCHC: 32.6 g/dL (ref 30.0–36.0)
MCV: 86.6 fL (ref 78.0–100.0)
Platelets: 244 10*3/uL (ref 150–400)
RBC: 5.38 MIL/uL (ref 4.22–5.81)
RDW: 12.8 % (ref 11.5–15.5)
WBC: 12.4 10*3/uL — ABNORMAL HIGH (ref 4.0–10.5)

## 2016-06-27 SURGERY — ANTERIOR LUMBAR FUSION 1 LEVEL
Anesthesia: General

## 2016-06-27 MED ORDER — ROCURONIUM BROMIDE 100 MG/10ML IV SOLN
INTRAVENOUS | Status: DC | PRN
  Administered 2016-06-27: 20 mg via INTRAVENOUS
  Administered 2016-06-27: 50 mg via INTRAVENOUS
  Administered 2016-06-27: 30 mg via INTRAVENOUS
  Administered 2016-06-27: 20 mg via INTRAVENOUS

## 2016-06-27 MED ORDER — SODIUM CHLORIDE 0.9 % IV SOLN: 250.0000 mL | INTRAVENOUS | Status: DC

## 2016-06-27 MED ORDER — CHLORHEXIDINE GLUCONATE 4 % EX LIQD: 60.0000 mL | Freq: Once | CUTANEOUS | Status: DC

## 2016-06-27 MED ORDER — ACETAMINOPHEN 10 MG/ML IV SOLN
1000.0000 mg | Freq: Four times a day (QID) | INTRAVENOUS | Status: AC
  Administered 2016-06-27 – 2016-06-28 (×3): 1000 mg via INTRAVENOUS
  Filled 2016-06-27 (×3): qty 100

## 2016-06-27 MED ORDER — ENOXAPARIN SODIUM 30 MG/0.3ML ~~LOC~~ SOLN: 30.0000 mg | SUBCUTANEOUS | Status: DC

## 2016-06-27 MED ORDER — FENTANYL CITRATE (PF) 100 MCG/2ML IJ SOLN
INTRAMUSCULAR | Status: AC
  Filled 2016-06-27: qty 4

## 2016-06-27 MED ORDER — MIDAZOLAM HCL 5 MG/5ML IJ SOLN
INTRAMUSCULAR | Status: DC | PRN
  Administered 2016-06-27: 2 mg via INTRAVENOUS

## 2016-06-27 MED ORDER — METHOCARBAMOL 500 MG PO TABS
500.0000 mg | ORAL_TABLET | Freq: Four times a day (QID) | ORAL | Status: DC | PRN
  Administered 2016-06-27: 500 mg via ORAL
  Filled 2016-06-27: qty 1

## 2016-06-27 MED ORDER — THROMBIN 20000 UNITS EX SOLR
CUTANEOUS | Status: AC
  Filled 2016-06-27: qty 20000

## 2016-06-27 MED ORDER — MORPHINE SULFATE (PF) 2 MG/ML IV SOLN: 2.0000 mg | INTRAVENOUS | Status: DC | PRN

## 2016-06-27 MED ORDER — MORPHINE SULFATE (PF) 4 MG/ML IV SOLN: 2.0000 mg | INTRAVENOUS | Status: DC | PRN

## 2016-06-27 MED ORDER — DEXAMETHASONE SODIUM PHOSPHATE 4 MG/ML IJ SOLN: 4.0000 mg | Freq: Four times a day (QID) | INTRAMUSCULAR | Status: DC

## 2016-06-27 MED ORDER — ENOXAPARIN SODIUM 40 MG/0.4ML ~~LOC~~ SOLN: 40.0000 mg | SUBCUTANEOUS | 0 refills | Status: DC

## 2016-06-27 MED ORDER — LIDOCAINE HCL (CARDIAC) 20 MG/ML IV SOLN
INTRAVENOUS | Status: DC | PRN
  Administered 2016-06-27: 80 mg via INTRAVENOUS

## 2016-06-27 MED ORDER — FENTANYL CITRATE (PF) 100 MCG/2ML IJ SOLN
INTRAMUSCULAR | Status: AC
  Filled 2016-06-27: qty 2

## 2016-06-27 MED ORDER — ONDANSETRON HCL 4 MG/2ML IJ SOLN
INTRAMUSCULAR | Status: DC | PRN
  Administered 2016-06-27: 4 mg via INTRAVENOUS

## 2016-06-27 MED ORDER — PROPOFOL 10 MG/ML IV BOLUS
INTRAVENOUS | Status: AC
  Filled 2016-06-27: qty 20

## 2016-06-27 MED ORDER — BUPIVACAINE HCL (PF) 0.25 % IJ SOLN
INTRAMUSCULAR | Status: AC
  Filled 2016-06-27: qty 30

## 2016-06-27 MED ORDER — PHENYLEPHRINE HCL 10 MG/ML IJ SOLN
INTRAMUSCULAR | Status: DC | PRN
  Administered 2016-06-27 (×3): 80 ug via INTRAVENOUS

## 2016-06-27 MED ORDER — FENTANYL CITRATE (PF) 100 MCG/2ML IJ SOLN
INTRAMUSCULAR | Status: DC | PRN
  Administered 2016-06-27: 50 ug via INTRAVENOUS
  Administered 2016-06-27 (×2): 100 ug via INTRAVENOUS
  Administered 2016-06-27: 50 ug via INTRAVENOUS
  Administered 2016-06-27: 100 ug via INTRAVENOUS

## 2016-06-27 MED ORDER — HYDROMORPHONE HCL 1 MG/ML IJ SOLN: 0.2500 mg | INTRAMUSCULAR | Status: DC | PRN

## 2016-06-27 MED ORDER — SODIUM CHLORIDE 0.9% FLUSH: 3.0000 mL | Freq: Two times a day (BID) | INTRAVENOUS | Status: DC

## 2016-06-27 MED ORDER — SUGAMMADEX SODIUM 200 MG/2ML IV SOLN
INTRAVENOUS | Status: DC | PRN
  Administered 2016-06-27: 200 mg via INTRAVENOUS

## 2016-06-27 MED ORDER — LACTATED RINGERS IV SOLN: INTRAVENOUS | Status: DC

## 2016-06-27 MED ORDER — OXYCODONE-ACETAMINOPHEN 10-325 MG PO TABS: 1.0000 | ORAL_TABLET | ORAL | 0 refills | Status: DC | PRN

## 2016-06-27 MED ORDER — MEPERIDINE HCL 25 MG/ML IJ SOLN
6.2500 mg | INTRAMUSCULAR | Status: DC | PRN
Start: 2016-06-27 — End: 2016-06-27

## 2016-06-27 MED ORDER — LACTATED RINGERS IV SOLN
INTRAVENOUS | Status: DC
  Administered 2016-06-27: 13:00:00 via INTRAVENOUS

## 2016-06-27 MED ORDER — 0.9 % SODIUM CHLORIDE (POUR BTL) OPTIME
TOPICAL | Status: DC | PRN
  Administered 2016-06-27 (×2): 1000 mL

## 2016-06-27 MED ORDER — PHENYLEPHRINE HCL 10 MG/ML IJ SOLN
INTRAVENOUS | Status: DC | PRN
  Administered 2016-06-27: 10 ug/min via INTRAVENOUS

## 2016-06-27 MED ORDER — DEXAMETHASONE SODIUM PHOSPHATE 4 MG/ML IJ SOLN
INTRAMUSCULAR | Status: DC | PRN
  Administered 2016-06-27: 10 mg via INTRAVENOUS

## 2016-06-27 MED ORDER — METHOCARBAMOL 500 MG PO TABS: 500.0000 mg | ORAL_TABLET | Freq: Three times a day (TID) | ORAL | 0 refills | Status: DC | PRN

## 2016-06-27 MED ORDER — ACETAMINOPHEN 650 MG RE SUPP: 650.0000 mg | RECTAL | Status: DC | PRN

## 2016-06-27 MED ORDER — DEXAMETHASONE 4 MG PO TABS
4.0000 mg | ORAL_TABLET | Freq: Four times a day (QID) | ORAL | Status: DC
  Administered 2016-06-27 – 2016-06-28 (×4): 4 mg via ORAL
  Filled 2016-06-27 (×4): qty 1

## 2016-06-27 MED ORDER — CEFAZOLIN SODIUM-DEXTROSE 2-4 GM/100ML-% IV SOLN
INTRAVENOUS | Status: AC
  Filled 2016-06-27: qty 100

## 2016-06-27 MED ORDER — PROMETHAZINE HCL 25 MG/ML IJ SOLN
INTRAMUSCULAR | Status: AC
  Administered 2016-06-27: 6.25 mg via INTRAVENOUS
  Filled 2016-06-27: qty 1

## 2016-06-27 MED ORDER — EPINEPHRINE PF 1 MG/ML IJ SOLN
INTRAMUSCULAR | Status: AC
  Filled 2016-06-27: qty 1

## 2016-06-27 MED ORDER — MENTHOL 3 MG MT LOZG: 1.0000 | LOZENGE | OROMUCOSAL | Status: DC | PRN

## 2016-06-27 MED ORDER — PROPOFOL 10 MG/ML IV BOLUS
INTRAVENOUS | Status: DC | PRN
Start: 2016-06-27 — End: 2016-06-27
  Administered 2016-06-27: 50 mg via INTRAVENOUS
  Administered 2016-06-27: 150 mg via INTRAVENOUS

## 2016-06-27 MED ORDER — MIDAZOLAM HCL 2 MG/2ML IJ SOLN
INTRAMUSCULAR | Status: AC
  Filled 2016-06-27: qty 2

## 2016-06-27 MED ORDER — LACTATED RINGERS IV SOLN
INTRAVENOUS | Status: DC | PRN
  Administered 2016-06-27 (×3): via INTRAVENOUS

## 2016-06-27 MED ORDER — THROMBIN 5000 UNITS EX SOLR
CUTANEOUS | Status: AC
  Filled 2016-06-27: qty 5000

## 2016-06-27 MED ORDER — ONDANSETRON HCL 4 MG/2ML IJ SOLN: 4.0000 mg | INTRAMUSCULAR | Status: DC | PRN

## 2016-06-27 MED ORDER — POLYETHYLENE GLYCOL 3350 17 G PO PACK: 17.0000 g | PACK | Freq: Every day | ORAL | Status: DC | PRN

## 2016-06-27 MED ORDER — CEFAZOLIN SODIUM-DEXTROSE 2-4 GM/100ML-% IV SOLN
2.0000 g | INTRAVENOUS | Status: AC
  Administered 2016-06-27: 2 g via INTRAVENOUS

## 2016-06-27 MED ORDER — ONDANSETRON HCL 4 MG PO TABS: 4.0000 mg | ORAL_TABLET | Freq: Three times a day (TID) | ORAL | 0 refills | Status: DC | PRN

## 2016-06-27 MED ORDER — PHENOL 1.4 % MT LIQD: 1.0000 | OROMUCOSAL | Status: DC | PRN

## 2016-06-27 MED ORDER — ACETAMINOPHEN 10 MG/ML IV SOLN
INTRAVENOUS | Status: DC | PRN
  Administered 2016-06-27: 1000 mg via INTRAVENOUS

## 2016-06-27 MED ORDER — SODIUM CHLORIDE 0.9% FLUSH: 3.0000 mL | INTRAVENOUS | Status: DC | PRN

## 2016-06-27 MED ORDER — PROMETHAZINE HCL 25 MG/ML IJ SOLN
6.2500 mg | INTRAMUSCULAR | Status: DC | PRN
Start: 2016-06-27 — End: 2016-06-27
  Administered 2016-06-27: 6.25 mg via INTRAVENOUS

## 2016-06-27 MED ORDER — METHOCARBAMOL 1000 MG/10ML IJ SOLN
500.0000 mg | Freq: Four times a day (QID) | INTRAVENOUS | Status: DC | PRN
  Filled 2016-06-27: qty 5

## 2016-06-27 MED ORDER — DOCUSATE SODIUM 100 MG PO CAPS
100.0000 mg | ORAL_CAPSULE | Freq: Two times a day (BID) | ORAL | Status: DC
  Administered 2016-06-27 (×2): 100 mg via ORAL
  Filled 2016-06-27 (×2): qty 1

## 2016-06-27 MED ORDER — CEFAZOLIN SODIUM-DEXTROSE 2-4 GM/100ML-% IV SOLN
2.0000 g | Freq: Three times a day (TID) | INTRAVENOUS | Status: AC
  Administered 2016-06-27 (×2): 2 g via INTRAVENOUS
  Filled 2016-06-27 (×2): qty 100

## 2016-06-27 MED ORDER — ACETAMINOPHEN 325 MG PO TABS: 650.0000 mg | ORAL_TABLET | ORAL | Status: DC | PRN

## 2016-06-27 MED ORDER — OXYCODONE HCL 5 MG PO TABS
10.0000 mg | ORAL_TABLET | ORAL | Status: DC | PRN
  Administered 2016-06-27 – 2016-06-28 (×3): 10 mg via ORAL
  Filled 2016-06-27 (×3): qty 2

## 2016-06-27 MED ORDER — ENOXAPARIN SODIUM 40 MG/0.4ML ~~LOC~~ SOLN
40.0000 mg | SUBCUTANEOUS | Status: DC
  Administered 2016-06-28: 40 mg via SUBCUTANEOUS
  Filled 2016-06-27: qty 0.4

## 2016-06-27 SURGICAL SUPPLY — 103 items
ADH SKN CLS APL DERMABOND .7 (GAUZE/BANDAGES/DRESSINGS) ×2
APPLIER CLIP 11 MED OPEN (CLIP)
APR CLP MED 11 20 MLT OPN (CLIP)
BLADE CLIPPER SURG (BLADE) ×1 IMPLANT
BLADE SURG 10 STRL SS (BLADE) ×2 IMPLANT
BONE VIVIGEN FORMABLE 5.4CC (Bone Implant) ×2 IMPLANT
CLIP APPLIE 11 MED OPEN (CLIP) ×2 IMPLANT
CLSR STERI-STRIP ANTIMIC 1/2X4 (GAUZE/BANDAGES/DRESSINGS) ×1 IMPLANT
CORDS BIPOLAR (ELECTRODE) ×2 IMPLANT
COVER BACK TABLE 60X90IN (DRAPES) ×2 IMPLANT
COVER SURGICAL LIGHT HANDLE (MISCELLANEOUS) ×2 IMPLANT
DERMABOND ADVANCED (GAUZE/BANDAGES/DRESSINGS) ×2
DERMABOND ADVANCED .7 DNX12 (GAUZE/BANDAGES/DRESSINGS) ×2 IMPLANT
DRAPE C-ARM 42X72 X-RAY (DRAPES) ×4 IMPLANT
DRAPE C-ARMOR (DRAPES) ×1 IMPLANT
DRAPE INCISE IOBAN 66X45 STRL (DRAPES) ×2 IMPLANT
DRAPE POUCH INSTRU U-SHP 10X18 (DRAPES) ×2 IMPLANT
DRAPE SURG 17X23 STRL (DRAPES) ×2 IMPLANT
DRAPE U-SHAPE 47X51 STRL (DRAPES) ×2 IMPLANT
DRSG AQUACEL AG ADV 3.5X 6 (GAUZE/BANDAGES/DRESSINGS) ×1 IMPLANT
DRSG MEPILEX BORDER 4X8 (GAUZE/BANDAGES/DRESSINGS) ×2 IMPLANT
DURAPREP 26ML APPLICATOR (WOUND CARE) ×2 IMPLANT
ELECT BLADE 4.0 EZ CLEAN MEGAD (MISCELLANEOUS) ×2
ELECT CAUTERY BLADE 6.4 (BLADE) ×2 IMPLANT
ELECT PENCIL ROCKER SW 15FT (MISCELLANEOUS) ×2 IMPLANT
ELECT REM PT RETURN 9FT ADLT (ELECTROSURGICAL) ×2
ELECTRODE BLDE 4.0 EZ CLN MEGD (MISCELLANEOUS) ×1 IMPLANT
ELECTRODE REM PT RTRN 9FT ADLT (ELECTROSURGICAL) ×1 IMPLANT
GAUZE SPONGE 4X4 16PLY XRAY LF (GAUZE/BANDAGES/DRESSINGS) IMPLANT
GLOVE BIO SURGEON STRL SZ 6.5 (GLOVE) ×2 IMPLANT
GLOVE BIO SURGEON STRL SZ7.5 (GLOVE) IMPLANT
GLOVE BIOGEL PI IND STRL 6.5 (GLOVE) ×1 IMPLANT
GLOVE BIOGEL PI IND STRL 8 (GLOVE) ×1 IMPLANT
GLOVE BIOGEL PI IND STRL 8.5 (GLOVE) ×2 IMPLANT
GLOVE BIOGEL PI INDICATOR 6.5 (GLOVE) ×1
GLOVE BIOGEL PI INDICATOR 8 (GLOVE) ×1
GLOVE BIOGEL PI INDICATOR 8.5 (GLOVE) ×2
GLOVE ECLIPSE 7.5 STRL STRAW (GLOVE) ×2 IMPLANT
GLOVE SS BIOGEL STRL SZ 8.5 (GLOVE) ×2 IMPLANT
GLOVE SUPERSENSE BIOGEL SZ 8.5 (GLOVE) ×2
GOWN STRL REUS W/ TWL LRG LVL3 (GOWN DISPOSABLE) ×4 IMPLANT
GOWN STRL REUS W/TWL 2XL LVL3 (GOWN DISPOSABLE) ×6 IMPLANT
GOWN STRL REUS W/TWL LRG LVL3 (GOWN DISPOSABLE) ×8
GRAFT BNE MATRIX VG FRMBL MD 5 (Bone Implant) IMPLANT
HEMOSTAT SNOW SURGICEL 2X4 (HEMOSTASIS) IMPLANT
INSERT FOGARTY 61MM (MISCELLANEOUS) IMPLANT
INSERT FOGARTY SM (MISCELLANEOUS) IMPLANT
INTERPLATE 39X14X12 (Plate) ×2 IMPLANT
KIT BASIN OR (CUSTOM PROCEDURE TRAY) ×2 IMPLANT
KIT ROOM TURNOVER OR (KITS) ×4 IMPLANT
LOOP VESSEL MAXI BLUE (MISCELLANEOUS) IMPLANT
LOOP VESSEL MINI RED (MISCELLANEOUS) IMPLANT
NDL SPNL 18GX3.5 QUINCKE PK (NEEDLE) ×1 IMPLANT
NEEDLE SPNL 18GX3.5 QUINCKE PK (NEEDLE) ×2 IMPLANT
NS IRRIG 1000ML POUR BTL (IV SOLUTION) ×3 IMPLANT
PACK LAMINECTOMY ORTHO (CUSTOM PROCEDURE TRAY) ×2 IMPLANT
PACK UNIVERSAL I (CUSTOM PROCEDURE TRAY) ×2 IMPLANT
PAD ARMBOARD 7.5X6 YLW CONV (MISCELLANEOUS) ×8 IMPLANT
PEEK SPACER INTERPLAT 35X14X12 (Peek) ×1 IMPLANT
PLATE SPINAL INTERPLT 39X14X12 (Plate) IMPLANT
PUTTY BONE DBX 5CC MIX (Putty) ×1 IMPLANT
SCREW BONE RESCUE (Screw) ×2 IMPLANT
SCREW BONE STANDARD (Screw) ×2 IMPLANT
SCREW BONE STD (Screw) IMPLANT
SCREW SPINAL STD (Orthopedic Implant) ×1 IMPLANT
SPONGE INTESTINAL PEANUT (DISPOSABLE) ×9 IMPLANT
SPONGE LAP 18X18 X RAY DECT (DISPOSABLE) IMPLANT
SPONGE LAP 4X18 X RAY DECT (DISPOSABLE) IMPLANT
SPONGE SURGIFOAM ABS GEL 100 (HEMOSTASIS) IMPLANT
STAPLER VISISTAT (STAPLE) IMPLANT
STRIP CLOSURE SKIN 1/2X4 (GAUZE/BANDAGES/DRESSINGS) IMPLANT
SURGIFLO W/THROMBIN 8M KIT (HEMOSTASIS) ×2 IMPLANT
SUT BONE WAX W31G (SUTURE) ×2 IMPLANT
SUT MON AB 3-0 SH 27 (SUTURE) ×2
SUT MON AB 3-0 SH27 (SUTURE) ×1 IMPLANT
SUT PDS AB 1 CTX 36 (SUTURE) ×3 IMPLANT
SUT PROLENE 4 0 RB 1 (SUTURE)
SUT PROLENE 4-0 RB1 .5 CRCL 36 (SUTURE) IMPLANT
SUT PROLENE 5 0 C 1 24 (SUTURE) IMPLANT
SUT PROLENE 5 0 CC1 (SUTURE) IMPLANT
SUT PROLENE 6 0 C 1 30 (SUTURE) ×2 IMPLANT
SUT PROLENE 6 0 CC (SUTURE) IMPLANT
SUT SILK 0 TIES 10X30 (SUTURE) ×2 IMPLANT
SUT SILK 2 0 TIES 10X30 (SUTURE) ×4 IMPLANT
SUT SILK 2 0SH CR/8 30 (SUTURE) IMPLANT
SUT SILK 3 0 TIES 10X30 (SUTURE) ×4 IMPLANT
SUT SILK 3 0SH CR/8 30 (SUTURE) IMPLANT
SUT VIC AB 0 CT1 27 (SUTURE) ×2
SUT VIC AB 0 CT1 27XBRD ANBCTR (SUTURE) ×1 IMPLANT
SUT VIC AB 1 CT1 18XCR BRD 8 (SUTURE) IMPLANT
SUT VIC AB 1 CT1 27 (SUTURE) ×4
SUT VIC AB 1 CT1 27XBRD ANBCTR (SUTURE) ×2 IMPLANT
SUT VIC AB 1 CT1 8-18 (SUTURE) ×2
SUT VIC AB 2-0 CT1 18 (SUTURE) ×3 IMPLANT
SUT VIC AB 2-0 CTB1 (SUTURE) ×2 IMPLANT
SUT VIC AB 3-0 SH 27 (SUTURE) ×2
SUT VIC AB 3-0 SH 27X BRD (SUTURE) ×1 IMPLANT
SYR BULB IRRIGATION 50ML (SYRINGE) ×2 IMPLANT
TOWEL OR 17X24 6PK STRL BLUE (TOWEL DISPOSABLE) ×2 IMPLANT
TOWEL OR 17X26 10 PK STRL BLUE (TOWEL DISPOSABLE) ×2 IMPLANT
TRAP SPECIMEN MUCOUS 40CC (MISCELLANEOUS) ×2 IMPLANT
TRAY FOLEY CATH 16FR SILVER (SET/KITS/TRAYS/PACK) ×2 IMPLANT
WATER STERILE IRR 1000ML POUR (IV SOLUTION) ×2 IMPLANT

## 2016-06-27 NOTE — Brief Op Note (Signed)
06/27/2016  10:26 AM  PATIENT:  Darrell Watson  56 y.o. male  PRE-OPERATIVE DIAGNOSIS:  Degenerative Disc Disease L5-S1 with radicular left leg pain  POST-OPERATIVE DIAGNOSIS:  Degenerative Disc Disease L5-S1 with radicular left leg pain  PROCEDURE:  Procedure(s) with comments: ANTERIOR LUMBAR FUSION L5-S1 (N/A) - Requests 3 hours ABDOMINAL EXPOSURE (N/A)  SURGEON:  Surgeon(s) and Role: Panel 1:    * Venita Lickahari Jonel Weldon, MD - Primary  Panel 2:    * Chuck Hinthristopher S Dickson, MD - Primary  PHYSICIAN ASSISTANT:   ASSISTANTS: Carmen Mayo   ANESTHESIA:   general  EBL:  Total I/O In: 2500 [I.V.:2500] Out: 430 [Urine:280; Blood:150]  BLOOD ADMINISTERED:none  DRAINS: none   LOCAL MEDICATIONS USED:  NONE  SPECIMEN:  No Specimen  DISPOSITION OF SPECIMEN:  N/A  COUNTS:  YES  TOURNIQUET:  * No tourniquets in log *  DICTATION: .Other Dictation: Dictation Number [id number]  PLAN OF CARE: Admit to inpatient   PATIENT DISPOSITION:  PACU - hemodynamically stable.

## 2016-06-27 NOTE — Progress Notes (Addendum)
Patient arrived to room from PACU. Safety precautions and orders reviewed with orders. VSS. Pt ambulated in room to bed at this time. SCDs applied. ICS encourage.  Sim BoastHavy, RN

## 2016-06-27 NOTE — Anesthesia Procedure Notes (Signed)
Procedure Name: Intubation Date/Time: 06/27/2016 7:50 AM Performed by: Oletta Lamas Pre-anesthesia Checklist: Patient identified, Emergency Drugs available, Suction available and Patient being monitored Patient Re-evaluated:Patient Re-evaluated prior to inductionOxygen Delivery Method: Circle System Utilized Preoxygenation: Pre-oxygenation with 100% oxygen Intubation Type: IV induction Ventilation: Mask ventilation without difficulty Laryngoscope Size: Glidescope Grade View: Grade I Tube type: Oral Number of attempts: 2 Airway Equipment and Method: Stylet Placement Confirmation: ETT inserted through vocal cords under direct vision,  positive ETCO2 and breath sounds checked- equal and bilateral Secured at: 23 cm Tube secured with: Tape Dental Injury: Teeth and Oropharynx as per pre-operative assessment  Comments: 1st attempt with Mac 3.  Epiglottis was seen (grade 3 view). Glidescope successful 1st attempt.

## 2016-06-27 NOTE — H&P (Signed)
History of Present Illness  The patient is a 56 year old male who comes in today for a preoperative History and Physical. The patient is scheduled for a ALIF L5-S1 to be performed by Dr. Debria Garret D. Shon Baton, MD at [facility] on 06/27/2016 . Please see the hospital record for complete dictated history and physical. 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 He does injections. He reports hx of good health.   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)  Problems Reconciled   Allergies  No Known Drug Allergies  Allergies Reconciled   Family History  Cancer  mother Cerebrovascular Accident  mother Hypertension  mother Rheumatoid Arthritis  mother  Social History  Tobacco use  former smoker; smoke(d) less than 1/2 pack(s) per day; uses less than half 1/2 can(s) smokeless per week Tobacco / smoke exposure  no Alcohol use  current drinker; drinks beer; only occasionally per week Children  2 Current work status  working full time Drug/Alcohol Rehab (Currently)  no Drug/Alcohol Rehab (Previously)  no Exercise  Exercises weekly; does gym / weights Illicit drug use  no Living situation  live with partner Marital status  divorced Number of flights of stairs before winded  4-5 Pain Contract  no  Medication History  Tylenol (325MG  Tablet, Oral) Active. Medications Reconciled  Past Surgical History  Arthroscopy of Shoulder  left  Other Problems No pertinent past medical history   Vitals 06/21/2016 9:02 AM Weight: 196 lb Height: 69in Body Surface Area: 2.05 m Body Mass Index: 28.94 kg/m  Temp.: 98.13F(Oral)  Pulse: 107 (Regular)  BP: 138/73 (Sitting, Left Arm, Standard)  General General Appearance-Not in acute distress. Orientation-Oriented X3. Build & Nutrition-Well nourished and Well developed.  Integumentary General  Characteristics Surgical Scars - no surgical scar evidence of previous lumbar surgery. Lumbar Spine-Skin examination of the lumbar spine is without deformity, skin lesions, lacerations or abrasions.  Chest and Lung Exam Auscultation Breath sounds - Normal and Clear.  Cardiovascular Auscultation Rhythm - Regular rate and rhythm.  Abdomen Palpation/Percussion Palpation and Percussion of the abdomen reveal - Soft, Non Tender and No Rebound tenderness.  Peripheral Vascular Lower Extremity Palpation - Posterior tibial pulse - Bilateral - 2+. Dorsalis pedis pulse - Bilateral - 2+.  Neurologic Sensation Lower Extremity - Bilateral - sensation is intact in the lower extremity. Reflexes Patellar Reflex - Bilateral - 2+. Achilles Reflex - Bilateral - 2+. Clonus - Bilateral - clonus not present. Hoffman's Sign - Bilateral - Hoffman's sign not present. Testing Seated Straight Leg Raise - Bilateral - Seated straight leg raise negative.  Musculoskeletal Spine/Ribs/Pelvis  Lumbosacral Spine: Inspection and Palpation - Tenderness - left lumbar paraspinals tender to palpation and right lumbar paraspinals tender to palpation. Strength and Tone: Strength - Hip Flexion - Bilateral - 5/5. Knee Extension - Bilateral - 5/5. Knee Flexion - Bilateral - 5/5. Ankle Dorsiflexion - Left - 4-/5. Right - 5/5. Ankle Plantarflexion - Bilateral - 5/5. Heel walk - Bilateral - able to heel walk with moderate difficulty. Toe Walk - Bilateral - able to walk on toes with moderate difficulty. Heel-Toe Walk - Bilateral - able to heel-toe walk without difficulty. ROM - Flexion - moderately decreased range of motion and painful. Extension - moderately decreased range of motion and painful. Left Lateral Bending - moderately decreased range of motion and painful. Right Lateral Bending - moderately decreased range of motion and painful. Right Rotation - moderately decreased  range of motion and painful. Left Rotation -  moderately decreased range of motion and painful. Pain - neither flexion or extension is more painful than the other. Lumbosacral Spine - Waddell's Signs - no Waddell's signs present. Lower Extremity Range of Motion - No true hip, knee or ankle pain with range of motion. Gait and Station - [employer] - no assistive devices.  MRI demonstrates marked disc space loss at L5-S1 with left lateral recess and foraminal stenosis.  Assessment & Plan  Goal Of Surgery: Discussed that goal of surgery is to reduce pain and improve function and quality of life. Patient is aware that despite all appropriate treatment that there pain and function could be the same, worse, or different.  Anterior lumbar fusion: Risks of surgery include, but are not limited to: Death, stroke, paralysis, nerve root damage/injury, bleeding, blood clots, loss of bowel/bladder control, sexual dysfunction, retrograde ejaculation, hardware failure, or mal-position, spinal fluid leak, adjacent segment disease, non-union, need for further surgery, ongoing or worse pain, injury to bladder, bowel and abdominal contents, infection. Need for supplemental posterior surgery including decompression and need for screw fixation.  At this point in time, the patient has severe debilitating chronic back pain with radicular left leg pain. Attempts at conservative management have failed. He has had injection therapy, physical therapy. He avidly works out. Despite this, the quality of his life continues to deteriorate. At this point, we have gone through a surgical option form, which would be an anterior lumbar interbody fusion. Risks include infection, bleeding, nerve damage, death, stroke, paralysis, failure to heal, need for further surgery, blood clots, ongoing or worst pain. All of his questions were addressed. He is expressing desire to move forward. We will plan on getting preoperative medical clearance and moving forward with surgery in a timely  fashion.

## 2016-06-27 NOTE — Progress Notes (Signed)
   06/27/16 1540  Mechanical VTE Prophylaxis (All Areas)  Mechanical VTE Prophylaxis Sequential compression devices, below knee  Mechanical VTE Prophylaxis Intervention On  Pressure Ulcer Prevention  Repositioned Supine  Positioning Frequency Able to turn self  Hygiene  Hygiene Peri care  Level of Assistance Independent  Mobility  Activity Ambulate in hall;Ambulate in room;Back to bed;Bathroom privileges  Level of Assistance Standby assist, set-up cues, supervision of patient - no hands on  Assistive Device Front wheel walker  Distance Ambulated (ft) 200 ft  Ambulation Response Tolerated well  Bed Position Semi-fowlers  Range of Motion All extremities;Active

## 2016-06-27 NOTE — Discharge Instructions (Signed)
Start aspirin 81mg  per day after lovenox is completed (10 days)  Walk as much as possible Ok to shower in 5 days

## 2016-06-27 NOTE — Evaluation (Signed)
Physical Therapy Evaluation Patient Details Name: Darrell Watson MRN: [id number] DOB: [birth year] Today's Date: 06/27/2016   History of Present Illness  Pt is 56 y.o. male s/p anterior lumbar fusion L5-S1.   Clinical Impression  Pt presents with increased pain and post-surgical deficits secondary to above. PTA, pt indep with all functional mobility and amb, living at home with fiance who is a CNA. Today, pt able to amb in hallway with RW and ascend/descend stairs. Educ on back precautions, log roll technique, and importance of mobility. Pt would benefit from continued acute PT services to maximize functional mobility and independence. Would benefit from amb without AD and higher level balance activities next visit.    Follow Up Recommendations No PT follow up    Equipment Recommendations  None recommended by PT    Recommendations for Other Services       Precautions / Restrictions Precautions Precautions: Back Precaution Booklet Issued: No Precaution Comments: Pt able to recall 2/3 precautions at beginning of session. Recalled 3/3 at end.  Required Braces or Orthoses: Spinal Brace Spinal Brace: Lumbar corset;Applied in sitting position Restrictions Weight Bearing Restrictions: No      Mobility  Bed Mobility Overal bed mobility: Needs Assistance Bed Mobility: Rolling;Sidelying to Sit Rolling: Supervision Sidelying to sit: Supervision       General bed mobility comments: Verbal cues for log roll technique.   Transfers Overall transfer level: Needs assistance Equipment used: None Transfers: Sit to/from Stand Sit to Stand: Supervision            Ambulation/Gait Ambulation/Gait assistance: Min guard Ambulation Distance (Feet): 300 Feet Assistive device: Rolling walker (2 wheeled) Gait Pattern/deviations: Step-through pattern;Decreased stride length Gait velocity: Decreased Gait velocity interpretation: Below normal speed for age/gender General Gait Details: Slow,  guarded gait s/p surgery.   Stairs Stairs: Yes Stairs assistance: Min guard Stair Management: Alternating pattern;One rail Left Number of Stairs: 3    Wheelchair Mobility    Modified Rankin (Stroke Patients Only)       Balance Overall balance assessment: Needs assistance Sitting-balance support: No upper extremity supported;Feet supported Sitting balance-Leahy Scale: Fair Sitting balance - Comments: Able to apply brace with minA in sitting.    Standing balance support: Bilateral upper extremity supported;During functional activity Standing balance-Leahy Scale: Poor                               Pertinent Vitals/Pain Pain Assessment: Faces Faces Pain Scale: Hurts a little bit Pain Descriptors / Indicators: Aching Pain Intervention(s): Limited activity within patient's tolerance;Repositioned    Home Living Family/patient expects to be discharged to:: Private residence Living Arrangements: Spouse/significant other Available Help at Discharge: Family (Fiance) Type of Home: House Home Access: Level entry     Home Layout: One level Home Equipment: None      Prior Function Level of Independence: Independent               Hand Dominance        Extremity/Trunk Assessment   Upper Extremity Assessment Upper Extremity Assessment: Overall WFL for tasks assessed    Lower Extremity Assessment Lower Extremity Assessment: Generalized weakness (s/p surgery)       Communication   Communication: No difficulties  Cognition Arousal/Alertness: Awake/alert Behavior During Therapy: WFL for tasks assessed/performed Overall Cognitive Status: Within Functional Limits for tasks assessed  General Comments      Exercises     Assessment/Plan    PT Assessment Patient needs continued PT services  PT Problem List Decreased strength;Decreased activity tolerance;Decreased balance;Decreased mobility;Decreased knowledge of  precautions;Pain       PT Treatment Interventions Gait training;Functional mobility training;Balance training;Therapeutic activities;Therapeutic exercise;Patient/family education    PT Goals (Current goals can be found in the Care Plan section)  Acute Rehab PT Goals Patient Stated Goal: Return home PT Goal Formulation: With patient Time For Goal Achievement: 07/11/16 Potential to Achieve Goals: Good    Frequency Min 5X/week   Barriers to discharge        Co-evaluation               End of Session Equipment Utilized During Treatment: Gait belt Activity Tolerance: Patient tolerated treatment well Patient left: in chair;with family/visitor present;with call bell/phone within reach Nurse Communication: Mobility status PT Visit Diagnosis: Other abnormalities of gait and mobility (R26.89)         Time: [id number] PT Time Calculation (min) (ACUTE ONLY): 16 min   Charges:   PT Evaluation $PT Eval Low Complexity: 1 Procedure     PT G Codes:       Dewayne HatchJaclyn Leigh Vivion Romano, SPT Office-[phone]  Ina HomesJaclyn Velera [place] 06/27/2016, 5:17 PM

## 2016-06-27 NOTE — Transfer of Care (Signed)
Immediate Anesthesia Transfer of Care Note  Patient: Darrell CooleyOllis M Slomski  Procedure(s) Performed: Procedure(s) with comments: ANTERIOR LUMBAR FUSION L5-S1 (N/A) - Requests 3 hours ABDOMINAL EXPOSURE (N/A)  Patient Location: PACU  Anesthesia Type:General  Level of Consciousness: awake, alert , oriented and patient cooperative  Airway & Oxygen Therapy: Patient Spontanous Breathing and Patient connected to nasal cannula oxygen  Post-op Assessment: Report given to RN and Post -op Vital signs reviewed and stable  Post vital signs: Reviewed and stable  Last Vitals:  Vitals:   06/27/16 0548 06/27/16 1117  BP: 125/70   Pulse: 96   Resp: 20   Temp: 36.8 C 36.2 C    Last Pain:  Vitals:   06/27/16 1117  TempSrc:   PainSc: 0-No pain         Complications: No apparent anesthesia complications

## 2016-06-27 NOTE — Op Note (Signed)
    NAME: Darrell Watson    MRN: [id number] DOB: Nov 08, 1960    DATE OF OPERATION: 06/27/2016  PREOP DIAGNOSIS: DDD L5-S1  POSTOP DIAGNOSIS: Same  PROCEDURE: Anterior retroperitoneal exposure of L5-S1  EXPOSURE SURGEON: Di Kindlehristopher S. Edilia Boickson, MD  SPINE SURGEON: Venita Lickahari Brooks, MD  ASSIST: Steward Dronearmen  PA-C  ANESTHESIA: Gen.   EBL: Minimal  INDICATIONS: Darrell Watson is a 56 y.o. male who presents for anterior lumbar interbody fusion of L5-S1. We were asked to provide anterior repair and exposure.  FINDINGS: Iliac artery was soft and free of plaque.  TECHNIQUE: The patient was taken to the operative room and received a general anesthetic. The level of the disc was marked under fluoroscopy. The abdomen was prepped and draped in usual sterile fashion. Transverse incision was made at the appropriate level and the dissection carried down to the anterior rectus sheath. Anterior rectus sheath was divided transversely and then mobilized superiorly and inferiorly in order to allow full mobilization of the left rectus abdominis muscle. This was initially retracted medially and the retro-peritoneal space was entered. The dissection was carried down past the psoas to the iliac, its in the sacral promontory was then identified. The middle sacral vessels were dissected free and then clipped and divided. Using blunt dissection and then expose the L5-S1 disc space. I then moved the muscle laterally. The Thompson retractor blades were placed for exposure of the L5-S1 disc. The remainder of the dictation is as per Dr. Shon BatonBrooks.  Waverly Ferrarihristopher Dickson, MD, FACS [facility]  DATE OF DICTATION:   06/27/2016

## 2016-06-27 NOTE — Anesthesia Postprocedure Evaluation (Addendum)
Anesthesia Post Note  Patient: Darrell Watson  Procedure(s) Performed: Procedure(s) (LRB): ANTERIOR LUMBAR FUSION L5-S1 (N/A) ABDOMINAL EXPOSURE (N/A)  Patient location during evaluation: PACU Anesthesia Type: General Level of consciousness: awake and alert Pain management: pain level controlled Vital Signs Assessment: post-procedure vital signs reviewed and stable Respiratory status: spontaneous breathing, nonlabored ventilation, respiratory function stable and patient connected to nasal cannula oxygen Cardiovascular status: blood pressure returned to baseline and stable Postop Assessment: no signs of nausea or vomiting Anesthetic complications: no       Last Vitals:  Vitals:   06/27/16 1148 06/27/16 1215  BP: 104/68 127/62  Pulse: (!) 102 (!) 108  Resp: 19 18  Temp:  36.8 C    Last Pain:  Vitals:   06/27/16 1237  TempSrc:   PainSc: 4                  Shelton SilvasKevin D Euell Schiff

## 2016-06-28 ENCOUNTER — Encounter (HOSPITAL_COMMUNITY): Payer: Self-pay | Admitting: Orthopedic Surgery

## 2016-06-28 ENCOUNTER — Inpatient Hospital Stay (HOSPITAL_COMMUNITY): Payer: 59

## 2016-06-28 DIAGNOSIS — Z981 Arthrodesis status: Secondary | ICD-10-CM

## 2016-06-28 NOTE — Progress Notes (Signed)
Physical Therapy Treatment Patient Details Name: Darrell Watson MRN: [id number] DOB: Mar 08, 1961 Today's Date: 06/28/2016    History of Present Illness Pt is 56 y.o. male s/p anterior lumbar fusion L5-S1.     PT Comments    Pt progressing towards physical therapy goals. Was able to perform transfers and ambulation with modified independence to min guard assist for occasional LOB (especially during turns). Pt was educated for back precautions, car transfer, walking program, and general safety at home. Will continue to follow and progress as able per POC.    Follow Up Recommendations  No PT follow up     Equipment Recommendations  None recommended by PT    Recommendations for Other Services       Precautions / Restrictions Precautions Precautions: Back;Fall Precaution Booklet Issued: No Precaution Comments: 3/3 precautions with increased time Required Braces or Orthoses: Spinal Brace Spinal Brace: Lumbar corset;Applied in sitting position Restrictions Weight Bearing Restrictions: No    Mobility  Bed Mobility Overal bed mobility: Needs Assistance Bed Mobility: Rolling;Sidelying to Sit;Sit to Sidelying Rolling: Modified independent (Device/Increase time) Sidelying to sit: Modified independent (Device/Increase time)     Sit to sidelying: Modified independent (Device/Increase time) General bed mobility comments: Pt demonstrated good log roll technique.   Transfers Overall transfer level: Needs assistance Equipment used: None Transfers: Sit to/from Stand Sit to Stand: Modified independent (Device/Increase time)         General transfer comment: Appeared slightly unsteady however pt did not require assist  Ambulation/Gait Ambulation/Gait assistance: Supervision;Min guard Ambulation Distance (Feet): 400 Feet Assistive device: None Gait Pattern/deviations: Step-through pattern;Decreased stride length (Extended trunk with hips forward and shoulders back) Gait velocity:  Decreased Gait velocity interpretation: Below normal speed for age/gender General Gait Details: VC's for improved posture and general safety. Pt with occasional LOB requiring min guard to recover, especially during turns.   Stairs            Wheelchair Mobility    Modified Rankin (Stroke Patients Only)       Balance Overall balance assessment: Needs assistance Sitting-balance support: No upper extremity supported;Feet supported Sitting balance-Leahy Scale: Good     Standing balance support: No upper extremity supported;During functional activity Standing balance-Leahy Scale: Fair                      Cognition Arousal/Alertness: Awake/alert Behavior During Therapy: WFL for tasks assessed/performed Overall Cognitive Status: Within Functional Limits for tasks assessed                      Exercises      General Comments        Pertinent Vitals/Pain Pain Assessment: Faces Faces Pain Scale: Hurts a little bit Pain Descriptors / Indicators: Operative site guarding Pain Intervention(s): Limited activity within patient's tolerance;Monitored during session;Repositioned    Home Living                      Prior Function            PT Goals (current goals can now be found in the care plan section) Acute Rehab PT Goals Patient Stated Goal: Return home PT Goal Formulation: With patient Time For Goal Achievement: 07/11/16 Potential to Achieve Goals: Good Progress towards PT goals: Progressing toward goals    Frequency    Min 5X/week      PT Plan Current plan remains appropriate    Co-evaluation  End of Session Equipment Utilized During Treatment: Back brace Activity Tolerance: Patient tolerated treatment well Patient left: in chair;with call bell/phone within reach;with family/visitor present Nurse Communication: Mobility status PT Visit Diagnosis: Other abnormalities of gait and mobility (R26.89)     Time:  [id number] PT Time Calculation (min) (ACUTE ONLY): 18 min  Charges:  $Gait Training: 8-22 mins                    G Codes:       Marylynn PearsonLaura D Raylinn Kosar 06/28/2016, 8:41 AM   Conni SlipperLaura Sofiah Lyne, PT, DPT Acute Rehabilitation Services Pager: [phone]

## 2016-06-28 NOTE — Progress Notes (Signed)
   VASCULAR SURGERY ASSESSMENT & PLAN:  1 Day Post-Op s/p: Anterior RP exposure of L5-S1.  Doing well  SUBJECTIVE: Pain well controlled  PHYSICAL EXAM: Vitals:   06/27/16 1715 06/27/16 2050 06/27/16 2355 06/28/16 0500  BP: 129/90 140/74 136/65 139/68  Pulse: (!) 106 (!) 114 (!) 109 (!) 105  Resp: 18 18 18 18   Temp: 98.2 F (36.8 C) 98.1 F (36.7 C) 98.4 F (36.9 C) 98.3 F (36.8 C)  TempSrc:  Oral Oral Oral  SpO2: 98% 95% 99% 100%  Weight:       Palpable left DP pulse  LABS: Lab Results  Component Value Date   WBC 12.4 (H) 06/27/2016   HGB 15.2 06/27/2016   HCT 46.6 06/27/2016   MCV 86.6 06/27/2016   PLT 244 06/27/2016   Lab Results  Component Value Date   CREATININE 1.44 (H) 06/27/2016    Active Problems:   Back pain  Cari CarawayChris Terriana Barreras Beeper: [phone] 06/28/2016

## 2016-06-28 NOTE — Progress Notes (Signed)
    Subjective: Procedure(s) (LRB): ANTERIOR LUMBAR FUSION L5-S1 (N/A) ABDOMINAL EXPOSURE (N/A) 1 Day Post-Op  Patient reports pain as 2 on 0-10 scale.  Reports decreased leg pain reports incisional back pain   Positive void Negative bowel movement Positive flatus Negative chest pain or shortness of breath  Objective: Vital signs in last 24 hours: Temp:  [97.2 F (36.2 C)-98.4 F (36.9 C)] 98.3 F (36.8 C) (03/02 0500) Pulse Rate:  [101-114] 105 (03/02 0500) Resp:  [18-24] 18 (03/02 0500) BP: (104-140)/(62-90) 139/68 (03/02 0500) SpO2:  [95 %-100 %] 100 % (03/02 0500)  Intake/Output from previous day: 03/01 0701 - 03/02 0700 In: 3210 [P.O.:360; I.V.:2700; IV Piggyback:150] Out: 580 [Urine:430; Blood:150]  Labs:  Recent Labs  06/27/16 1439  WBC 12.4*  RBC 5.38  HCT 46.6  PLT 244    Recent Labs  06/27/16 1439  CREATININE 1.44*   No results for input(s): LABPT, INR in the last 72 hours.  Physical Exam: Neurologically intact ABD soft Intact pulses distally Incision: dressing C/D/I Compartment soft  Assessment/Plan: Patient stable  xrays pending Continue mobilization with physical therapy Continue care  Advance diet Up with therapy  Plan on d/c to home provided xray is satisfactory Doing well overall  Venita Lickahari Junell Cullifer, MD [facility] [phone]

## 2016-06-28 NOTE — Evaluation (Signed)
Occupational Therapy Evaluation Patient Details Name: Darrell Watson MRN: [id number] DOB: July 11, 1960 Today's Date: 06/28/2016    History of Present Illness Pt is 56 y.o. male s/p anterior lumbar fusion L5-S1.    Clinical Impression   Patient evaluated by Occupational Therapy with no further acute OT needs identified. All education has been completed and the patient has no further questions. See below for any follow-up Occupational Therapy or equipment needs. OT to sign off. Thank you for referral.      Follow Up Recommendations  No OT follow up    Equipment Recommendations  None recommended by OT    Recommendations for Other Services       Precautions / Restrictions Precautions Precautions: Back;Fall Precaution Booklet Issued: No Precaution Comments: 3/3 precautions with increased time Required Braces or Orthoses: Spinal Brace Spinal Brace: Lumbar corset;Applied in sitting position      Mobility Bed Mobility               General bed mobility comments: in chair on arrival  Transfers Overall transfer level: Modified independent                    Balance Overall balance assessment: Needs assistance                                          ADL Overall ADL's : Independent                                       General ADL Comments: able to cross bil le   Back handout provided and reviewed adls in detail. Pt educated on: clothing between brace, never sleep in brace, set an alarm at night for medication, avoid sitting for long periods of time, correct bed positioning for sleeping, correct sequence for bed mobility, avoiding lifting more than 5 pounds and never wash directly over incision. All education is complete and patient indicates understanding.     Vision         Perception     Praxis      Pertinent Vitals/Pain Pain Assessment: Faces Faces Pain Scale: Hurts a little bit Pain Location: back Pain  Descriptors / Indicators: Sore Pain Intervention(s): Limited activity within patient's tolerance     Hand Dominance Right   Extremity/Trunk Assessment Upper Extremity Assessment Upper Extremity Assessment: Overall WFL for tasks assessed   Lower Extremity Assessment Lower Extremity Assessment: Overall WFL for tasks assessed   Cervical / Trunk Assessment Cervical / Trunk Assessment: Other exceptions Cervical / Trunk Exceptions: s/p surg   Communication Communication Communication: No difficulties   Cognition Arousal/Alertness: Awake/alert Behavior During Therapy: WFL for tasks assessed/performed Overall Cognitive Status: Within Functional Limits for tasks assessed                     General Comments       Exercises       Shoulder Instructions      Home Living Family/patient expects to be discharged to:: Private residence Living Arrangements: Spouse/significant other Available Help at Discharge: Family Type of Home: House Home Access: Level entry     Home Layout: One level     Bathroom Shower/Tub: Chief Strategy Officer: Standard         Additional Comments: fiance  to (A) and small dog in house called Bootsie      Prior Functioning/Environment Level of Independence: Independent                 OT Problem List:        OT Treatment/Interventions:      OT Goals(Current goals can be found in the care plan section) Acute Rehab OT Goals Patient Stated Goal: Return home  OT Frequency:     Barriers to D/C:            Co-evaluation              End of Session Equipment Utilized During Treatment: Back brace  Activity Tolerance: Patient tolerated treatment well Patient left: Other (comment) (with transport to xray)  OT Visit Diagnosis: Pain Pain - part of body:  (back)                ADL either performed or assessed with clinical judgement  Time: 0920-0934 OT Time Calculation (min): 14 min Charges:  OT General  Charges $OT Visit: 1 Procedure OT Evaluation $OT Eval Moderate Complexity: 1 Procedure G-Codes:      Mateo FlowJones, Brynn   OTR/L Pager: [phone] Office: [phone] .   Boone MasterJones, Tracy Gerken B 06/28/2016, 3:48 PM

## 2016-06-28 NOTE — Progress Notes (Signed)
Patient DC'd home via car with wife.  DC instructions and prescriptions given to patient and both fully understood.  Vital signs and assessments were stable.  

## 2016-06-28 NOTE — Op Note (Signed)
NAMEdgar Frisk:  Breitenstein, Aalijah                  ACCOUNT NO.:  [id number]  MEDICAL RECORD NO.:  [id number]  LOCATION:                                 FACILITY:  PHYSICIAN:  Uva Runkel D. Shon BatonBrooks, M.D. DATE OF BIRTH:  04-02-1961  DATE OF PROCEDURE:  06/27/2016 DATE OF DISCHARGE:                              OPERATIVE REPORT   PREOPERATIVE DIAGNOSIS:  Lumbar degenerative disk disease L5-S1 with neuropathic leg pain, left greater than right.  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  PROCEDURE PERFORMED:  Anterior lumbar interbody fusion approach.  Approach SURGEON:  Di KindleChristopher S. Edilia Boickson, M.D.  FIRST ASSISTANT:  [facility] Mayo.  HISTORY:  This is a very pleasant 56 year old gentleman, who was in his usual state of good to excellent health, but has suffered from chronic low back, buttock, and bilateral leg pain, left worse than right, for several years now.  Multiple attempts at conservative management have failed and his quality of life has continued to diminish.  As a result, we elected to proceed with surgery.  All appropriate risks, benefits, and alternatives were discussed with the patient and consent was obtained.  OPERATIVE NOTE:  The patient was brought to the operating room and placed supine on the operating table.  After successful induction of general anesthesia and endotracheal intubation, TEDs, SCDs, and a Foley were inserted.  The anterior abdomen was prepped and draped in a standard fashion and a time-out was taken.  Please refer to Dr. Thayer Ohmhris Dickson's dictation for specifics on the approach. Once the retroperitoneal approach was completed and retractors were in place, and the L5-S1 disk space was exposed, I scrubbed back into the case.  We first confirmed that we were at the appropriate level with the needle in the disk space and lateral fluoroscopy.  Once this was done, an annulotomy was performed with a  10-blade scalpel and then using pituitary rongeurs, curettes, and Kerrison rongeurs, I removed all of the disk material.  I gently distracted the space and then used a 2 and 3 mm Kerrison punch to resect the osteophyte from the posterior aspect of the vertebral bodies of L5 and S1.  I then used a fine nerve hook and small curved curette to release the annulus from the posterior aspect of the L5 and S1 vertebral bodies which allowed me to remove the bulk of the posterior annulus.  At this point, using live fluoro in the lateral view, I was able to confirm that I had parallel distraction of the endplates.  I then made sure adequate foraminal decompression.  At this point, I was pleased with the diskectomy.  I then used sequential rasp trials and elected to use the large 14, 12-degree lordotic implant as this provided the greatest distraction and stability.  I then obtained the actual implant and then malleted it to the appropriate depth.  The Zero profile anterior locking plate was applied and secured into position.  I then used 3 screws, two 25 mm length screws going into L5 and one 20 mm length screw going to S1 to secure the implant.  The locking plate was then secured  down and torqued off according to manufactures standards.  I irrigated the wound copiously with normal saline and sequentially placed some FloSeal to aid in hemostasis and then sequentially removed the retractors confirming there was no bleeding.  With the ALIF complete, final x-rays were taken which were satisfactory.  Using a #1 Prolene suture, I closed the fascia of the rectus with a running suture.  I then closed with a 2-0 and 3-0 Monocryl.  Steri- Strips and dry dressing were applied.  Final AP intraoperative x-ray was taken to confirm that there was no retained surgical instrumentation in the wound.  This was read by the radiologist and cleared.  The patient was ultimately extubated, transferred to the PACU  without incident.  At the end of the case, all needle and sponge counts were correct.  Please note that the implant system used was the RSV inner plate system with a Zero profile plate.     Meliya Mcconahy D. Shon Baton, M.D.   ______________________________ Donn Pierini. Shon Baton, M.D.    DDB/MEDQ  D:  06/27/2016  T:  06/28/2016  Job:  [id number]

## 2016-06-28 NOTE — Progress Notes (Signed)
**  Preliminary report by tech**  Bilateral lower extremity venous duplex completed. There is no evidence of deep or superficial vein thrombosis involving the right and left lower extremities. All visualized vessels appear patent and compressible. There is no evidence of Baker's cysts bilaterally.  Levin Baconlaire Bynum- RDMS, RVT 11:16 AM  06/28/2016

## 2016-07-02 NOTE — Discharge Summary (Signed)
Physician Discharge Summary  Patient ID: Darrell Watson MRN: [id number] DOB/AGE: 07-11-1960 56 y.o.  Admit date: 06/27/2016 Discharge date: 07/02/2016  Admission Diagnoses:  Lumbar DDD  Discharge Diagnoses:  Active Problems:   Back pain   Past Medical History:  Diagnosis Date  . Arthritis     Surgeries: Procedure(s): ANTERIOR LUMBAR FUSION L5-S1 ABDOMINAL EXPOSURE on 06/27/2016   Consultants (if any): Treatment Team:  Chuck Hint, MD  Discharged Condition: Improved  Hospital Course: Darrell Watson is an 56 y.o. male who was admitted 06/27/2016 with a diagnosis of Lumbar DDD and went to the operating room on 06/27/2016 and underwent the above named procedures.  Post op day 1 pt reports decreased leg pain.  Pt reports incisional pain controlled on oral medications.  Pt reports voiding w/o difficulty.  Pt ambulating in hallway. Pt cleared by OT/PT for DC.  He was given perioperative antibiotics:  Anti-infectives    Start     Dose/Rate Route Frequency Ordered Stop   06/27/16 1215  ceFAZolin (ANCEF) IVPB 2g/100 mL premix     2 g 200 mL/hr over 30 Minutes Intravenous Every 8 hours 06/27/16 1214 06/27/16 2037   06/27/16 0559  ceFAZolin (ANCEF) 2-4 GM/100ML-% IVPB    Comments:  Lonia Chimera   : cabinet override      06/27/16 0559 06/27/16 0800   06/27/16 0556  ceFAZolin (ANCEF) IVPB 2g/100 mL premix     2 g 200 mL/hr over 30 Minutes Intravenous 30 min pre-op 06/27/16 0556 06/27/16 0810    .  He was given sequential compression devices, early ambulation, and TED for DVT prophylaxis.  He benefited maximally from the hospital stay and there were no complications.    Recent vital signs:  Vitals:   06/28/16 0500 06/28/16 0835  BP: 139/68 113/62  Pulse: (!) 105 (!) 104  Resp: 18 18  Temp: 98.3 F (36.8 C) 97.5 F (36.4 C)    Recent laboratory studies:  Lab Results  Component Value Date   HGB 15.2 06/27/2016   HGB 15.5 06/20/2016   HGB 13.3 07/28/2012   Lab Results   Component Value Date   WBC 12.4 (H) 06/27/2016   PLT 244 06/27/2016   No results found for: INR Lab Results  Component Value Date   NA 139 06/20/2016   K 4.1 06/20/2016   CL 103 06/20/2016   CO2 27 06/20/2016   BUN 7 06/20/2016   CREATININE 1.44 (H) 06/27/2016   GLUCOSE 151 (H) 06/20/2016    Discharge Medications:   Allergies as of 06/28/2016   No Known Allergies     Medication List    TAKE these medications   acetaminophen 325 MG tablet Commonly known as:  TYLENOL Take 650 mg by mouth every 6 (six) hours as needed (for pain.).   enoxaparin 40 MG/0.4ML injection Commonly known as:  LOVENOX Inject 0.4 mLs (40 mg total) into the skin daily. 10 day supply 1 injection per day   methocarbamol 500 MG tablet Commonly known as:  ROBAXIN Take 1 tablet (500 mg total) by mouth 3 (three) times daily as needed for muscle spasms.   ondansetron 4 MG tablet Commonly known as:  ZOFRAN Take 1 tablet (4 mg total) by mouth every 8 (eight) hours as needed for nausea or vomiting.   oxyCODONE-acetaminophen 10-325 MG tablet Commonly known as:  PERCOCET Take 1 tablet by mouth every 4 (four) hours as needed for pain.   testosterone cypionate 200 MG/ML injection Commonly known as:  DEPOTESTOSTERONE CYPIONATE Inject 300 mg into the muscle every 14 (fourteen) days. Gets 1.5 ml at Dr office every 2 weeks Next dose is due 06-24-2016       Diagnostic Studies: Dg Lumbar Spine 2-3 Views  Result Date: 06/28/2016 CLINICAL DATA:  Status post lumbar fusion EXAM: LUMBAR SPINE - 2-3 VIEW COMPARISON:  06/27/2016 FINDINGS: Anterior fusion at L5-S1 is noted stable from the prior postoperative study. No compression deformity is seen. No soft tissue abnormality is noted. IMPRESSION: Stable L5-S1 fusion. Electronically Signed   By: Alcide CleverMark  Lukens M.D.   On: 06/28/2016 09:52   Dg Lumbar Spine 2-3 Views  Result Date: 06/27/2016 CLINICAL DATA:  ALIF EXAM: DG C-ARM 61-120 MIN; LUMBAR SPINE - 2-3 VIEW COMPARISON:   Lumbar MRI 10/13/2015 FINDINGS: AP and lateral C-arm images were obtained in the operating room. Images showed satisfactory placement of interbody spacer at L5-S1. Final image shows interbody spacer and anterior plate and screws in satisfactory position L5-S1. IMPRESSION: L5-S1 ALIF Electronically Signed   By: Marlan Palauharles  Clark M.D.   On: 06/27/2016 11:15   Dg C-arm 61-120 Min  Result Date: 06/27/2016 CLINICAL DATA:  ALIF EXAM: DG C-ARM 61-120 MIN; LUMBAR SPINE - 2-3 VIEW COMPARISON:  Lumbar MRI 10/13/2015 FINDINGS: AP and lateral C-arm images were obtained in the operating room. Images showed satisfactory placement of interbody spacer at L5-S1. Final image shows interbody spacer and anterior plate and screws in satisfactory position L5-S1. IMPRESSION: L5-S1 ALIF Electronically Signed   By: Marlan Palauharles  Clark M.D.   On: 06/27/2016 11:15   Dg Or Local Abdomen  Addendum Date: 06/27/2016   ADDENDUM REPORT: 06/27/2016 11:15 ADDENDUM: These results were called by telephone at the time of interpretation on 06/27/2016 at 11:14 am to Dr. Venita LickAHARI BROOKS in the OR, who verbally acknowledged these results. No unexpected radiopaque foreign bodies in the visualized abdomen or pelvis. Original report is otherwise unchanged. Electronically Signed   By: Delbert PhenixJason A Poff M.D.   On: 06/27/2016 11:15   Result Date: 06/27/2016 CLINICAL DATA:  Low back pain with leg pain.  ALIF. EXAM: OR LOCAL ABDOMEN COMPARISON:  10/13/2015 MRI lumbar spine. Intraoperative fluoroscopic radiographs from earlier today. FINDINGS: Portable intraoperative supine radiographs of the abdomen and pelvis demonstrate expected postsurgical changes from anterior spinal fusion at L5-S1 with bone cage overlying the L5-S1 disc space and no evidence of hardware fracture or loosening on these views. Surgical clips overlie the left deep pelvis. Nonobstructive bowel gas pattern. No evidence of pneumatosis or pneumoperitoneum. Healed deformities in the bilateral lower pubic rami.  Clear lung bases. Esophageal temperature probe overlies the lower thoracic esophagus. IMPRESSION: Expected postsurgical changes from anterior spinal fusion at L5-S1. Electronically Signed: By: Delbert PhenixJason A Poff M.D. On: 06/27/2016 11:03    Disposition: 01-Home or Self Care Pt will present to clinic in 2 weeks Post op medications provided for the pt  Discharge Instructions    Incentive spirometry RT    Complete by:  As directed       Follow-up Information    BROOKS,DAHARI D, MD. Schedule an appointment as soon as possible for a visit in 2 weeks.   Specialty:  Orthopedic Surgery Why:  If symptoms worsen, For suture removal, For wound re-check Contact information: [address] [place] [address] [phone]            Signed: Kirt BoysMayo, Carmen Christina 07/02/2016, 8:52 PM

## 2016-07-03 ENCOUNTER — Inpatient Hospital Stay (HOSPITAL_COMMUNITY): Payer: 59

## 2016-07-03 ENCOUNTER — Encounter (HOSPITAL_COMMUNITY): Payer: Self-pay | Admitting: *Deleted

## 2016-07-03 ENCOUNTER — Inpatient Hospital Stay (HOSPITAL_COMMUNITY)
Admission: EM | Admit: 2016-07-03 | Discharge: 2016-07-05 | DRG: 065 | Disposition: A | Discharge status: Rehabilitation Facility | Payer: 59 | Attending: Family Medicine | Admitting: Family Medicine

## 2016-07-03 ENCOUNTER — Other Ambulatory Visit (HOSPITAL_COMMUNITY): Payer: Self-pay

## 2016-07-03 ENCOUNTER — Emergency Department (HOSPITAL_COMMUNITY): Payer: 59

## 2016-07-03 DIAGNOSIS — Y92009 Unspecified place in unspecified non-institutional (private) residence as the place of occurrence of the external cause: Secondary | ICD-10-CM | POA: Diagnosis not present

## 2016-07-03 DIAGNOSIS — D649 Anemia, unspecified: Secondary | ICD-10-CM | POA: Diagnosis present

## 2016-07-03 DIAGNOSIS — R000 Tachycardia, unspecified: Secondary | ICD-10-CM | POA: Diagnosis present

## 2016-07-03 DIAGNOSIS — I69351 Hemiplegia and hemiparesis following cerebral infarction affecting right dominant side: Secondary | ICD-10-CM | POA: Diagnosis not present

## 2016-07-03 DIAGNOSIS — I69322 Dysarthria following cerebral infarction: Secondary | ICD-10-CM | POA: Diagnosis not present

## 2016-07-03 DIAGNOSIS — I639 Cerebral infarction, unspecified: Secondary | ICD-10-CM | POA: Diagnosis not present

## 2016-07-03 DIAGNOSIS — Z6828 Body mass index (BMI) 28.0-28.9, adult: Secondary | ICD-10-CM

## 2016-07-03 DIAGNOSIS — E663 Overweight: Secondary | ICD-10-CM | POA: Diagnosis present

## 2016-07-03 DIAGNOSIS — M96840 Postprocedural hematoma of a musculoskeletal structure following a musculoskeletal system procedure: Secondary | ICD-10-CM | POA: Diagnosis present

## 2016-07-03 DIAGNOSIS — R29702 NIHSS score 2: Secondary | ICD-10-CM | POA: Diagnosis present

## 2016-07-03 DIAGNOSIS — I6789 Other cerebrovascular disease: Secondary | ICD-10-CM | POA: Diagnosis present

## 2016-07-03 DIAGNOSIS — R531 Weakness: Secondary | ICD-10-CM | POA: Diagnosis not present

## 2016-07-03 DIAGNOSIS — I110 Hypertensive heart disease with heart failure: Secondary | ICD-10-CM | POA: Diagnosis present

## 2016-07-03 DIAGNOSIS — I5022 Chronic systolic (congestive) heart failure: Secondary | ICD-10-CM | POA: Diagnosis present

## 2016-07-03 DIAGNOSIS — Y838 Other surgical procedures as the cause of abnormal reaction of the patient, or of later complication, without mention of misadventure at the time of the procedure: Secondary | ICD-10-CM | POA: Diagnosis present

## 2016-07-03 DIAGNOSIS — D62 Acute posthemorrhagic anemia: Secondary | ICD-10-CM | POA: Diagnosis present

## 2016-07-03 DIAGNOSIS — M545 Low back pain: Secondary | ICD-10-CM | POA: Diagnosis present

## 2016-07-03 DIAGNOSIS — L989 Disorder of the skin and subcutaneous tissue, unspecified: Secondary | ICD-10-CM | POA: Diagnosis present

## 2016-07-03 DIAGNOSIS — Z981 Arthrodesis status: Secondary | ICD-10-CM | POA: Diagnosis not present

## 2016-07-03 DIAGNOSIS — G8191 Hemiplegia, unspecified affecting right dominant side: Secondary | ICD-10-CM | POA: Diagnosis present

## 2016-07-03 DIAGNOSIS — D500 Iron deficiency anemia secondary to blood loss (chronic): Secondary | ICD-10-CM | POA: Diagnosis not present

## 2016-07-03 DIAGNOSIS — M7989 Other specified soft tissue disorders: Secondary | ICD-10-CM | POA: Diagnosis not present

## 2016-07-03 DIAGNOSIS — M549 Dorsalgia, unspecified: Secondary | ICD-10-CM | POA: Diagnosis present

## 2016-07-03 DIAGNOSIS — S344XXD Injury of lumbosacral plexus, subsequent encounter: Secondary | ICD-10-CM | POA: Diagnosis not present

## 2016-07-03 DIAGNOSIS — S301XXS Contusion of abdominal wall, sequela: Secondary | ICD-10-CM | POA: Diagnosis not present

## 2016-07-03 DIAGNOSIS — K5900 Constipation, unspecified: Secondary | ICD-10-CM | POA: Diagnosis not present

## 2016-07-03 DIAGNOSIS — R29810 Facial weakness: Secondary | ICD-10-CM | POA: Diagnosis not present

## 2016-07-03 DIAGNOSIS — I679 Cerebrovascular disease, unspecified: Secondary | ICD-10-CM | POA: Diagnosis not present

## 2016-07-03 DIAGNOSIS — I69392 Facial weakness following cerebral infarction: Secondary | ICD-10-CM | POA: Diagnosis not present

## 2016-07-03 DIAGNOSIS — R935 Abnormal findings on diagnostic imaging of other abdominal regions, including retroperitoneum: Secondary | ICD-10-CM | POA: Diagnosis not present

## 2016-07-03 DIAGNOSIS — S301XXA Contusion of abdominal wall, initial encounter: Secondary | ICD-10-CM | POA: Diagnosis not present

## 2016-07-03 DIAGNOSIS — R0989 Other specified symptoms and signs involving the circulatory and respiratory systems: Secondary | ICD-10-CM | POA: Diagnosis not present

## 2016-07-03 DIAGNOSIS — I638 Other cerebral infarction: Secondary | ICD-10-CM | POA: Diagnosis not present

## 2016-07-03 HISTORY — DX: Tachycardia, unspecified: R00.0

## 2016-07-03 HISTORY — DX: Anemia, unspecified: D64.9

## 2016-07-03 LAB — COMPREHENSIVE METABOLIC PANEL
ALT: 26 U/L (ref 17–63)
AST: 43 U/L — ABNORMAL HIGH (ref 15–41)
Albumin: 3.5 g/dL (ref 3.5–5.0)
Alkaline Phosphatase: 48 U/L (ref 38–126)
Anion gap: 10 (ref 5–15)
BUN: 9 mg/dL (ref 6–20)
CO2: 30 mmol/L (ref 22–32)
Calcium: 8.9 mg/dL (ref 8.9–10.3)
Chloride: 98 mmol/L — ABNORMAL LOW (ref 101–111)
Creatinine, Ser: 1.22 mg/dL (ref 0.61–1.24)
GFR calc Af Amer: >60 mL/min (ref >60)
GFR calc non Af Amer: >60 mL/min (ref >60)
Glucose, Bld: 143 mg/dL — ABNORMAL HIGH (ref 65–99)
Potassium: 3.6 mmol/L (ref 3.5–5.1)
Sodium: 138 mmol/L (ref 135–145)
Total Bilirubin: 1.3 mg/dL — ABNORMAL HIGH (ref 0.3–1.2)
Total Protein: 6.8 g/dL (ref 6.5–8.1)

## 2016-07-03 LAB — RAPID URINE DRUG SCREEN, HOSP PERFORMED
Amphetamines: NOT DETECTED
Barbiturates: NOT DETECTED
Benzodiazepines: NOT DETECTED
Cocaine: NOT DETECTED
Opiates: POSITIVE — AB
Tetrahydrocannabinol: NOT DETECTED

## 2016-07-03 LAB — CBC
HCT: 26.3 % — ABNORMAL LOW (ref 39.0–52.0)
Hemoglobin: 8.3 g/dL — ABNORMAL LOW (ref 13.0–17.0)
MCH: 27.8 pg (ref 26.0–34.0)
MCHC: 31.6 g/dL (ref 30.0–36.0)
MCV: 88 fL (ref 78.0–100.0)
Platelets: 295 10*3/uL (ref 150–400)
RBC: 2.99 MIL/uL — ABNORMAL LOW (ref 4.22–5.81)
RDW: 13.4 % (ref 11.5–15.5)
WBC: 8.7 10*3/uL (ref 4.0–10.5)

## 2016-07-03 LAB — DIFFERENTIAL
Basophils Absolute: 0 10*3/uL (ref 0.0–0.1)
Basophils Relative: 0 %
Eosinophils Absolute: 0.1 10*3/uL (ref 0.0–0.7)
Eosinophils Relative: 1 %
Lymphocytes Relative: 7 %
Lymphs Abs: 0.6 10*3/uL — ABNORMAL LOW (ref 0.7–4.0)
Monocytes Absolute: 1 10*3/uL (ref 0.1–1.0)
Monocytes Relative: 12 %
Neutro Abs: 7 10*3/uL (ref 1.7–7.7)
Neutrophils Relative %: 80 %

## 2016-07-03 LAB — URINALYSIS, COMPLETE (UACMP) WITH MICROSCOPIC
Bacteria, UA: NONE SEEN
Bilirubin Urine: NEGATIVE
GLUCOSE, UA: 50 mg/dL — AB
Hgb urine dipstick: NEGATIVE
KETONES UR: 20 mg/dL — AB
Leukocytes, UA: NEGATIVE
Nitrite: NEGATIVE
PH: 7 (ref 5.0–8.0)
PROTEIN: 30 mg/dL — AB
Specific Gravity, Urine: 1.024 (ref 1.005–1.030)
Squamous Epithelial / LPF: NONE SEEN

## 2016-07-03 LAB — LIPID PANEL
CHOL/HDL RATIO: 2.4 ratio
Cholesterol: 106 mg/dL (ref 0–200)
HDL: 45 mg/dL (ref >40)
LDL CALC: 39 mg/dL (ref 0–99)
TRIGLYCERIDES: 111 mg/dL (ref <150)
VLDL: 22 mg/dL (ref 0–40)

## 2016-07-03 LAB — FOLATE: Folate: 20 ng/mL (ref >5.9)

## 2016-07-03 LAB — PREPARE RBC (CROSSMATCH)

## 2016-07-03 LAB — I-STAT TROPONIN, ED: Troponin i, poc: 0 ng/mL (ref 0.00–0.08)

## 2016-07-03 LAB — RETICULOCYTES
RBC.: 3.04 MIL/uL — AB (ref 4.22–5.81)
RETIC COUNT ABSOLUTE: 145.9 10*3/uL (ref 19.0–186.0)
RETIC CT PCT: 4.8 % — AB (ref 0.4–3.1)

## 2016-07-03 LAB — APTT: aPTT: 32 seconds (ref 24–36)

## 2016-07-03 LAB — PROTIME-INR
INR: 1.07
Prothrombin Time: 13.9 seconds (ref 11.4–15.2)

## 2016-07-03 MED ORDER — ACETAMINOPHEN 160 MG/5ML PO SOLN: 650.0000 mg | ORAL | Status: DC | PRN

## 2016-07-03 MED ORDER — OXYCODONE-ACETAMINOPHEN 7.5-325 MG PO TABS: 2.0000 | ORAL_TABLET | Freq: Four times a day (QID) | ORAL | Status: DC | PRN

## 2016-07-03 MED ORDER — ACETAMINOPHEN 325 MG PO TABS: 650.0000 mg | ORAL_TABLET | ORAL | Status: DC | PRN

## 2016-07-03 MED ORDER — SENNOSIDES-DOCUSATE SODIUM 8.6-50 MG PO TABS
1.0000 | ORAL_TABLET | Freq: Two times a day (BID) | ORAL | Status: DC
  Administered 2016-07-04 – 2016-07-05 (×3): 1 via ORAL
  Filled 2016-07-03 (×3): qty 1

## 2016-07-03 MED ORDER — SODIUM CHLORIDE 0.9 % IV SOLN: INTRAVENOUS | Status: AC

## 2016-07-03 MED ORDER — METHOCARBAMOL 500 MG PO TABS
500.0000 mg | ORAL_TABLET | Freq: Three times a day (TID) | ORAL | Status: DC | PRN
Start: 2016-07-03 — End: 2016-07-05

## 2016-07-03 MED ORDER — ACETAMINOPHEN 650 MG RE SUPP: 650.0000 mg | RECTAL | Status: DC | PRN

## 2016-07-03 MED ORDER — IOPAMIDOL (ISOVUE-300) INJECTION 61%
INTRAVENOUS | Status: AC
  Filled 2016-07-03: qty 100

## 2016-07-03 MED ORDER — ENOXAPARIN SODIUM 40 MG/0.4ML ~~LOC~~ SOLN
40.0000 mg | SUBCUTANEOUS | Status: DC
  Administered 2016-07-03 – 2016-07-04 (×2): 40 mg via SUBCUTANEOUS
  Filled 2016-07-03 (×2): qty 0.4

## 2016-07-03 MED ORDER — TESTOSTERONE CYPIONATE 200 MG/ML IM SOLN: 300.0000 mg | INTRAMUSCULAR | Status: DC

## 2016-07-03 MED ORDER — SODIUM CHLORIDE 0.9 % IV SOLN: Freq: Once | INTRAVENOUS | Status: DC

## 2016-07-03 MED ORDER — STROKE: EARLY STAGES OF RECOVERY BOOK
Freq: Once | Status: DC
  Filled 2016-07-03: qty 1

## 2016-07-03 NOTE — ED Triage Notes (Signed)
Pt and family member reports pt got up to restroom around 1:30am, pt seemed disoriented and facial droop at that time. lsn was last night before bed. Pt has slurred speech, right side facial droop. Grips are equal but he states right side feels weaker to him. Pt had recent back surgery on 3/1.

## 2016-07-03 NOTE — ED Notes (Signed)
Patient transported to X-ray 

## 2016-07-03 NOTE — ED Notes (Signed)
EKG given to Dr. Kohut 

## 2016-07-03 NOTE — ED Provider Notes (Signed)
[facility]-EMERGENCY DEPT Provider Note    By signing my name below, I, Earmon Phoenix, attest that this documentation has been prepared under the direction and in the presence of Raeford Razor, MD. Electronically Signed: Earmon Phoenix, ED Scribe. 07/03/16. 2:10 PM.    History   Chief Complaint Chief Complaint  Patient presents with  . Stroke Symptoms   The history is provided by the patient and medical records. No language interpreter was used.    Darrell Watson is a 56 y.o. male s/p lumbar fusion by Dr. Venita Lick 6 days ago who presents to the Emergency Department complaining of stroke symptoms onset approximately 11 hours ago when he got up to go to the bathroom. He states he fell when he ambulated at that time and was feeling right sided weakness. He then went back to bed and had to call a family member from work to come help him this morning when he woke up. He reports associated slurred speech and right sided facial droop. His family member called Dr. Shon Baton, who was in surgery and the office staff instructed them to come to the ED. He has not done anything to treat the symptoms. There are no modifying factors noted. He denies HA, visual changes. He states his back is improving and his next appt with Dr. Shon Baton in is nine days.   Past Medical History:  Diagnosis Date  . Arthritis     Patient Active Problem List   Diagnosis Date Noted  . Back pain 06/27/2016    Past Surgical History:  Procedure Laterality Date  . ABDOMINAL EXPOSURE N/A 06/27/2016   Procedure: ABDOMINAL EXPOSURE;  Surgeon: Chuck Hint, MD;  Location: [facility];  Service: Vascular;  Laterality: N/A;  . ANTERIOR LUMBAR FUSION N/A 06/27/2016   Procedure: ANTERIOR LUMBAR FUSION L5-S1;  Surgeon: Venita Lick, MD;  Location: [facility] OR;  Service: Orthopedics;  Laterality: N/A;  Requests 3 hours  . FINGER AMPUTATION Left   . SHOULDER SURGERY         Home Medications    Prior to Admission medications     Medication Sig Start Date End Date Taking? Authorizing Provider  acetaminophen (TYLENOL) 325 MG tablet Take 650 mg by mouth every 6 (six) hours as needed (for pain.).    Historical Provider, MD  enoxaparin (LOVENOX) 40 MG/0.4ML injection Inject 0.4 mLs (40 mg total) into the skin daily. 10 day supply 1 injection per day 06/27/16   Venita Lick, MD  methocarbamol (ROBAXIN) 500 MG tablet Take 1 tablet (500 mg total) by mouth 3 (three) times daily as needed for muscle spasms. 06/27/16   Venita Lick, MD  ondansetron (ZOFRAN) 4 MG tablet Take 1 tablet (4 mg total) by mouth every 8 (eight) hours as needed for nausea or vomiting. 06/27/16   Venita Lick, MD  oxyCODONE-acetaminophen (PERCOCET) 10-325 MG tablet Take 1 tablet by mouth every 4 (four) hours as needed for pain. 06/27/16   Venita Lick, MD  testosterone cypionate (DEPOTESTOSTERONE CYPIONATE) 200 MG/ML injection Inject 300 mg into the muscle every 14 (fourteen) days. Gets 1.5 ml at Dr office every 2 weeks Next dose is due 06-24-2016    Historical Provider, MD    Family History History reviewed. No pertinent family history.  Social History Social History  Substance Use Topics  . Smoking status: Never Smoker  . Smokeless tobacco: Never Used  . Alcohol use Yes     Comment: BEER  OCC     Allergies   Patient has  no known allergies.   Review of Systems Review of Systems A complete 10 system review of systems was obtained and all systems are negative except as noted in the HPI and PMH.    Physical Exam Updated Vital Signs BP 146/74 (BP Location: Left Arm)   Pulse 110   Temp 97.8 F (36.6 C) (Oral)   Resp 18   SpO2 100%   Physical Exam  Constitutional: He is oriented to person, place, and time. He appears well-developed and well-nourished.  HENT:  Head: Normocephalic.  Eyes: EOM are normal.  Neck: Normal range of motion.  Cardiovascular: Regular rhythm and normal heart sounds.  Tachycardia present.  Exam reveals no gallop and  no friction rub.   No murmur heard. Mildly tachycardic.  Pulmonary/Chest: Effort normal and breath sounds normal. No respiratory distress. He has no wheezes. He has no rales.  Abdominal: Soft. He exhibits no distension. There is tenderness.  Surgical site to lower abdomen with clean dry dressing. Mild abdominal tenderness but seems appropriate with recent surgery.  Musculoskeletal: Normal range of motion.  Neurological: He is alert and oriented to person, place, and time.  Right facial nerve palsy. Speech dysarthric but understandable. Right pronator drift. Movement in lower extremities is somewhat limited by pain but seems equal.  Psychiatric: He has a normal mood and affect.  Nursing note and vitals reviewed.    ED Treatments / Results  DIAGNOSTIC STUDIES: Oxygen Saturation is 100% on RA, normal by my interpretation.   COORDINATION OF CARE: 12:10 PM- Will order labs, CT and consult neurology. Pt verbalizes understanding and agrees to plan.  Medications - No data to display  Labs (all labs ordered are listed, but only abnormal results are displayed) Labs Reviewed  CBC - Abnormal; Notable for the following:       Result Value   RBC 2.99 (*)    Hemoglobin 8.3 (*)    HCT 26.3 (*)    All other components within normal limits  DIFFERENTIAL - Abnormal; Notable for the following:    Lymphs Abs 0.6 (*)    All other components within normal limits  COMPREHENSIVE METABOLIC PANEL - Abnormal; Notable for the following:    Chloride 98 (*)    Glucose, Bld 143 (*)    AST 43 (*)    Total Bilirubin 1.3 (*)    All other components within normal limits  PROTIME-INR  APTT  RAPID URINE DRUG SCREEN, HOSP PERFORMED  OCCULT BLOOD X 1 CARD TO LAB, STOOL  I-STAT TROPOININ, ED    EKG  EKG Interpretation None       Radiology Ct Head Wo Contrast  Result Date: 07/03/2016 CLINICAL DATA:  Altered mental status today.  Right facial droop. EXAM: CT HEAD WITHOUT CONTRAST TECHNIQUE: Contiguous  axial images were obtained from the base of the skull through the vertex without intravenous contrast. COMPARISON:  Head CT scan 01/03/2010. FINDINGS: Brain: Appears normal without hemorrhage, infarct, mass lesion, mass effect, midline shift or abnormal extra-axial fluid collection. No hydrocephalus or pneumocephalus. Vascular: Negative. Skull: Intact. Sinuses/Orbits: Small mucous retention cyst or polyp right maxillary sinus is unchanged. Other: None. IMPRESSION: Negative head CT. Unchanged small mucous retention cyst or polyp right maxillary sinus is noted. Electronically Signed   By: Drusilla Kannerhomas  Dalessio M.D.   On: 07/03/2016 13:38    Procedures Procedures (including critical care time)  Medications Ordered in ED Medications - No data to display   Initial Impression / Assessment and Plan / ED Course  I  have reviewed the triage vital signs and the nursing notes.  Pertinent labs & imaging results that were available during my care of the patient were reviewed by me and considered in my medical decision making (see chart for details).    I personally preformed the services scribed in my presence. The recorded information has been reviewed is accurate. Raeford Razor, MD.   Final Clinical Impressions(s) / ED Diagnoses   Final diagnoses:  Anemia  Acute CVA  New Prescriptions New Prescriptions   No medications on file     Raeford Razor, MD 07/16/16 1435

## 2016-07-03 NOTE — ED Notes (Signed)
Pt to MRI at this time.

## 2016-07-03 NOTE — H&P (Signed)
History and Physical    Darrell Watson AVW:[id number] DOB: October 16, 1960 DOA: 07/03/2016  PCP: Katy ApoPOLITE,RONALD D, MD Patient coming from: home  Chief Complaint: stroke like symptoms/facial droop/right sided weakness  HPI: Darrell Watson is a very pleasant 56 y.o. male with medical history significant for recent L5-S1 fusion anterior approach, DDD presents to the emergency department with the chief complaint of facial droop slurred speech and right-sided weakness. Initial evaluation reveals strokelike symptoms and anemia likely related to acute blood loss.  Information is obtained from the patient and the wife is at the bedside. Approximately 1 week ago he had back surgery was doing well with mobilization and pain management. He awakened around 1:30 this morning and go to the bathroom and he fell. He noted inability to bear weight on the right leg. In addition it was noted he had a right facial droop. Wife thought it was mostly related to pain medicines. Patient went back to bed. Wife got up in the morning and went to work. She states patient called her at work around noon and  told her he had fallen again. She had home she noted a facial droop and his worsening slurred speech. Patient denies headache visual disturbances numbness or tingling of his extremities. He denies chest pain palpitation shortness of breath. He denies nausea vomiting but does endorse some abdominal pain at his incision site. He denies dysuria hematuria frequency or urgency. He does report last bowel movement was yesterday but he has not been very regular with bowel movements. He was transported to the emergency department    ED Course: In the emergency department he is afebrile hemodynamically stable with a blood pressure on the high and of normal tachycardic with a heart rate range of 110-120.  Review of Systems: As per HPI otherwise 10 point review of systems negative.   Ambulatory Status: Currently ambulates with assistance due to  back surgery recently. His usually independent with ADLs  Past Medical History:  Diagnosis Date  . Anemia   . Arthritis   . Tachycardia     Past Surgical History:  Procedure Laterality Date  . ABDOMINAL EXPOSURE N/A 06/27/2016   Procedure: ABDOMINAL EXPOSURE;  Surgeon: Chuck Hinthristopher S Dickson, MD;  [facility];  Service: Vascular;  Laterality: N/A;  . ANTERIOR LUMBAR FUSION N/A 06/27/2016   Procedure: ANTERIOR LUMBAR FUSION L5-S1;  Surgeon: Venita Lickahari Brooks, MD;  Location: [facility] OR;  Service: Orthopedics;  Laterality: N/A;  Requests 3 hours  . FINGER AMPUTATION Left   . SHOULDER SURGERY      Social History   Social History  . Marital status: Married    Spouse name: N/A  . Number of children: N/A  . Years of education: N/A   Occupational History  . Not on file.   Social History Main Topics  . Smoking status: Never Smoker  . Smokeless tobacco: Never Used  . Alcohol use Yes     Comment: BEER  OCC  . Drug use: No  . Sexual activity: Not on file   Other Topics Concern  . Not on file   Social History Narrative  . No narrative on file   Lives at home with his wife No Known Allergies  History reviewed. No pertinent family history.  Prior to Admission medications   Medication Sig Start Date End Date Taking? Authorizing Provider  acetaminophen (TYLENOL) 325 MG tablet Take 650 mg by mouth every 6 (six) hours as needed (for pain.).    Historical Provider, MD  enoxaparin (LOVENOX) 40 MG/0.4ML injection Inject 0.4 mLs (40 mg total) into the skin daily. 10 day supply 1 injection per day 06/27/16   Venita Lick, MD  methocarbamol (ROBAXIN) 500 MG tablet Take 1 tablet (500 mg total) by mouth 3 (three) times daily as needed for muscle spasms. 06/27/16   Venita Lick, MD  ondansetron (ZOFRAN) 4 MG tablet Take 1 tablet (4 mg total) by mouth every 8 (eight) hours as needed for nausea or vomiting. 06/27/16   Venita Lick, MD  oxyCODONE-acetaminophen (PERCOCET) 10-325 MG tablet Take 1 tablet by  mouth every 4 (four) hours as needed for pain. 06/27/16   Venita Lick, MD  testosterone cypionate (DEPOTESTOSTERONE CYPIONATE) 200 MG/ML injection Inject 300 mg into the muscle every 14 (fourteen) days. Gets 1.5 ml at Dr office every 2 weeks Next dose is due 06-24-2016    Historical Provider, MD    Physical Exam: Vitals:   07/03/16 1337 07/03/16 1345 07/03/16 1400 07/03/16 1430  BP: 135/55 115/63 141/70 148/85  Pulse: 115 110 109 117  Resp: (!) 28 (!) 28 24 25   Temp:      TempSrc:      SpO2: 99% 97% 97% 97%     General:  Appears calm and comfortable HOB up Eyes:  PERRL, EOMI, normal lids, iris ENT:  grossly normal hearing, lips & tongue, his membranes of his mouth are pink slightly dry Neck:  no LAD, masses or thyromegaly Cardiovascular:  Tachycardic but regular, no m/r/g. No LE edema.  Respiratory:  CTA bilaterally, no w/r/r. Normal respiratory effort. Abdomen:  soft, ntnd, sluggish bowel sounds dressing to his lower abdomen is dry and intact Skin:  no rash or induration seen on limited exam Musculoskeletal:  grossly normal tone BUE/BLE, good ROM, no bony abnormality Psychiatric:  grossly normal mood and affect, speech fluent and appropriate, AOx3 Neurologic:  CN 2-12 grossly intact, moves all extremities in coordinated fashion, sensation intact left grip 5 out of 5 right grip 4/5 right LE 4/5 left strength 5/5  Labs on Admission: I have personally reviewed following labs and imaging studies  CBC:  Recent Labs Lab 06/27/16 1439 07/03/16 1224  WBC 12.4* 8.7  NEUTROABS  --  7.0  HGB 15.2 8.3*  HCT 46.6 26.3*  MCV 86.6 88.0  PLT 244 295   Basic Metabolic Panel:  Recent Labs Lab 06/27/16 1439 07/03/16 1224  NA  --  138  K  --  3.6  CL  --  98*  CO2  --  30  GLUCOSE  --  143*  BUN  --  9  CREATININE 1.44* 1.22  CALCIUM  --  8.9   GFR: Estimated Creatinine Clearance: 74.4 mL/min (by C-G formula based on SCr of 1.22 mg/dL). Liver Function Tests:  Recent  Labs Lab 07/03/16 1224  AST 43*  ALT 26  ALKPHOS 48  BILITOT 1.3*  PROT 6.8  ALBUMIN 3.5   No results for input(s): LIPASE, AMYLASE in the last 168 hours. No results for input(s): AMMONIA in the last 168 hours. Coagulation Profile:  Recent Labs Lab 07/03/16 1224  INR 1.07   Cardiac Enzymes: No results for input(s): CKTOTAL, CKMB, CKMBINDEX, TROPONINI in the last 168 hours. BNP (last 3 results) No results for input(s): PROBNP in the last 8760 hours. HbA1C: No results for input(s): HGBA1C in the last 72 hours. CBG: No results for input(s): GLUCAP in the last 168 hours. Lipid Profile: No results for input(s): CHOL, HDL, LDLCALC, TRIG, CHOLHDL, LDLDIRECT in the  last 72 hours. Thyroid Function Tests: No results for input(s): TSH, T4TOTAL, FREET4, T3FREE, THYROIDAB in the last 72 hours. Anemia Panel: No results for input(s): VITAMINB12, FOLATE, FERRITIN, TIBC, IRON, RETICCTPCT in the last 72 hours. Urine analysis: No results found for: COLORURINE, APPEARANCEUR, LABSPEC, PHURINE, GLUCOSEU, HGBUR, BILIRUBINUR, KETONESUR, PROTEINUR, UROBILINOGEN, NITRITE, LEUKOCYTESUR  Creatinine Clearance: Estimated Creatinine Clearance: 74.4 mL/min (by C-G formula based on SCr of 1.22 mg/dL).  Sepsis Labs: @LABRCNTIP (procalcitonin:4,lacticidven:4) )No results found for this or any previous visit (from the past 240 hour(s)).   Radiological Exams on Admission: Ct Head Wo Contrast  Result Date: 07/03/2016 CLINICAL DATA:  Altered mental status today.  Right facial droop. EXAM: CT HEAD WITHOUT CONTRAST TECHNIQUE: Contiguous axial images were obtained from the base of the skull through the vertex without intravenous contrast. COMPARISON:  Head CT scan 01/03/2010. FINDINGS: Brain: Appears normal without hemorrhage, infarct, mass lesion, mass effect, midline shift or abnormal extra-axial fluid collection. No hydrocephalus or pneumocephalus. Vascular: Negative. Skull: Intact. Sinuses/Orbits: Small mucous  retention cyst or polyp right maxillary sinus is unchanged. Other: None. IMPRESSION: Negative head CT. Unchanged small mucous retention cyst or polyp right maxillary sinus is noted. Electronically Signed   By: Drusilla Kanner M.D.   On: 07/03/2016 13:38    EKG: Independently reviewed sinus tachycardia.  Assessment/Plan Principal Problem:   Stroke [facility]) Active Problems:   Back pain   Anemia   Tachycardia   Right sided weakness   Facial droop   #1. Stroke/facial droop/right-sided weakness. CT with no acute findings. Taking Lovenox for a week since his surgery. No known risk factors. Evaluated by neurology who recommended admission for stroke workup. -Admit to telemetry -Obtain MRI/MRA of the brain -Echocardiogram, carotid Dopplers -Hg A1c, lipid panel -Physical therapy, occupational therapy and speech consultation -Nothing by mouth until he passes the bedside swallow eval -no aspirin per ortho post op orders -start aspirin and statin when able  #2. Tachycardia. Patient reports his baseline heart rate 105. Current rate a little higher at admission. May be related to anemia.  EKG as noted above. -iv fluids -monitor  3. Anemia. Likely acute blood loss related to recent surgery in setting of anti-coagulation. Hemoglobin 8.3 on admission. Chart review indicates Hg 15.2 6 days ago. Patient has been on lovenox post op. INR within limits on normal. No reported bleeding from incision.  -fobt -anemia panel -transfuse 1 unit PRBC's -recheck in am -CT abdomen to rule out bleeding  #4. Back pain status post L5-S1 fusion, anterior approach. -Pain management -Physical therapy -continue robaxin    DVT prophylaxis: lovenox  Code Status: full  Family Communication: wife at bedside  Disposition Plan: home  Consults called: none  Admission status: inpatient    Gwenyth Bender MD [facility]  If 7PM-7AM, please contact night-coverage [email or web address] Password [facility]  07/03/2016,  2:43 PM

## 2016-07-03 NOTE — Consult Note (Signed)
Requesting Physician: Dr. Juleen China    Chief Complaint: stroke like symptoms  History obtained from:  Patient     HPI:                                                                                                                                         Darrell Watson is an 56 y.o. male who recently had an anterior retroperitoneal exposure of L5-S1 for repair exposure. This was done on 06/27/2016. She has been on Lovenox since then with no difficulties. As of last night at approximate 9:00 he went to sleep and felt normal. At 12:00 patient got up to go to the bathroom and noted that his right leg could not sustain his weight and he fell down. He was at this time that it was noted that he had a right facial droop and he was having difficulty ambulating and keeping his balance. Patient was brought to the hospital where a CT of his brain was obtained and did not show any abnormalities such as hemorrhage, tumor, or stroke. Neurology was asked to consult. On time of consult patient is dysarthric, he does show a right facial droop, he is clumsy with his right hand, in addition he does seem to have weakness in his dorsiflexion of his right foot and eversion of his right foot. Strength of his right leg is difficult as he has significant amount of pain in his surgical site. It is also noted that he has had a significant drop in his hemoglobin and hematocrit since day of surgery. Hemoglobin has dropped from 15.2-8.3 and hematocrit has dropped from 46-26.  Date last known well: Date: 07/02/2016 Time last known well: Time: 21:00 tPA Given: No: out of window   Past Medical History:  Diagnosis Date  . Arthritis     Past Surgical History:  Procedure Laterality Date  . ABDOMINAL EXPOSURE N/A 06/27/2016   Procedure: ABDOMINAL EXPOSURE;  Surgeon: Chuck Hint, MD;  Location: [facility] OR;  Service: Vascular;  Laterality: N/A;  . ANTERIOR LUMBAR FUSION N/A 06/27/2016   Procedure: ANTERIOR LUMBAR FUSION L5-S1;   Surgeon: Venita Lick, MD;  Location: [facility] OR;  Service: Orthopedics;  Laterality: N/A;  Requests 3 hours  . FINGER AMPUTATION Left   . SHOULDER SURGERY      History reviewed. No pertinent family history. Social History:  reports that he has never smoked. He has never used smokeless tobacco. He reports that he drinks alcohol. He reports that he does not use drugs.  Allergies: No Known Allergies  Medications:  No current facility-administered medications for this encounter.    Current Outpatient Prescriptions  Medication Sig Dispense Refill  . acetaminophen (TYLENOL) 325 MG tablet Take 650 mg by mouth every 6 (six) hours as needed (for pain.).    Marland Kitchen enoxaparin (LOVENOX) 40 MG/0.4ML injection Inject 0.4 mLs (40 mg total) into the skin daily. 10 day supply 1 injection per day 10 Syringe 0  . methocarbamol (ROBAXIN) 500 MG tablet Take 1 tablet (500 mg total) by mouth 3 (three) times daily as needed for muscle spasms. 21 tablet 0  . ondansetron (ZOFRAN) 4 MG tablet Take 1 tablet (4 mg total) by mouth every 8 (eight) hours as needed for nausea or vomiting. 20 tablet 0  . oxyCODONE-acetaminophen (PERCOCET) 10-325 MG tablet Take 1 tablet by mouth every 4 (four) hours as needed for pain. 42 tablet 0  . testosterone cypionate (DEPOTESTOSTERONE CYPIONATE) 200 MG/ML injection Inject 300 mg into the muscle every 14 (fourteen) days. Gets 1.5 ml at Dr office every 2 weeks Next dose is due 06-24-2016       ROS:                                                                                                                                       History obtained from the patient  General ROS: negative for - chills, fatigue, fever, night sweats, weight gain or weight loss Psychological ROS: negative for - behavioral disorder, hallucinations, memory difficulties, mood swings or suicidal  ideation Ophthalmic ROS: negative for - blurry vision, double vision, eye pain or loss of vision ENT ROS: negative for - epistaxis, nasal discharge, oral lesions, sore throat, tinnitus or vertigo Allergy and Immunology ROS: negative for - hives or itchy/watery eyes Hematological and Lymphatic ROS: negative for - bleeding problems, bruising or swollen lymph nodes Endocrine ROS: negative for - galactorrhea, hair pattern changes, polydipsia/polyuria or temperature intolerance Respiratory ROS: negative for - cough, hemoptysis, shortness of breath or wheezing Cardiovascular ROS: negative for - chest pain, dyspnea on exertion, edema or irregular heartbeat Gastrointestinal ROS: negative for - abdominal pain, diarrhea, hematemesis, nausea/vomiting or stool incontinence Genito-Urinary ROS: negative for - dysuria, hematuria, incontinence or urinary frequency/urgency Musculoskeletal ROS: negative for - joint swelling or muscular weakness Neurological ROS: as noted in HPI Dermatological ROS: negative for rash and skin lesion changes  Neurologic Examination:  Blood pressure 141/70, pulse 109, temperature 97.8 F (36.6 C), temperature source Oral, resp. rate 24, SpO2 97 %.  HEENT-  Normocephalic, no lesions, without obvious abnormality.  Normal external eye and conjunctiva.  Normal TM's bilaterally.  Normal auditory canals and external ears. Normal external nose, mucus membranes and septum.  Normal pharynx. Cardiovascular- S1, S2 normal, pulses palpable throughout   Lungs- chest clear, no wheezing, rales, normal symmetric air entry Abdomen- Tender secondary to abdominal surgery however bowel sounds are present Extremities- no edema Lymph-no adenopathy palpable Musculoskeletal-no joint tenderness, deformity or swelling Skin-warm and dry, no hyperpigmentation, vitiligo, or suspicious lesions  Neurological  Examination Mental Status: Alert, oriented, thought content appropriate.  Speech dysarthric without evidence of aphasia.  Able to follow 3 step commands without difficulty. Cranial Nerves: II:  Visual fields grossly normal,  III,IV, VI: ptosis not present, extra-ocular motions intact bilaterally, pupils equal, round, reactive to light and accommodation V,VII: smile symmetric, facial light touch sensation normal bilaterally VIII: hearing normal bilaterally IX,X: uvula rises symmetrically XI: bilateral shoulder shrug XII: midline tongue extension Motor: Right : Upper extremity   4+/5    Left:     Upper extremity   5/5  Lower extremity   4/5     Lower extremity   5/5 --Lower extremity strength was difficult to ascertain secondary to abdominal surgery which causes pain with any movement of his right leg. It was very notable however that his dorsi flexion was much weaker along with decreased range of motion and he has a very weak eversion of his ankle. Tone and bulk:normal tone throughout; no atrophy noted Sensory: Pinprick and light touch intact throughout, bilaterally Deep Tendon Reflexes: 2+ and symmetric throughout Plantars: Right: downgoing   Left: downgoing Cerebellar: Finger-nose and heel-to-shin were normal however was noted that he has significant clumsiness with finger to finger motions on his right hand. Gait: Gait was not tested due to safety       Lab Results: Basic Metabolic Panel:  Recent Labs Lab 06/27/16 1439 07/03/16 1224  NA  --  138  K  --  3.6  CL  --  98*  CO2  --  30  GLUCOSE  --  143*  BUN  --  9  CREATININE 1.44* 1.22  CALCIUM  --  8.9    Liver Function Tests:  Recent Labs Lab 07/03/16 1224  AST 43*  ALT 26  ALKPHOS 48  BILITOT 1.3*  PROT 6.8  ALBUMIN 3.5   No results for input(s): LIPASE, AMYLASE in the last 168 hours. No results for input(s): AMMONIA in the last 168 hours.  CBC:  Recent Labs Lab 06/27/16 1439 07/03/16 1224  WBC  12.4* 8.7  NEUTROABS  --  7.0  HGB 15.2 8.3*  HCT 46.6 26.3*  MCV 86.6 88.0  PLT 244 295    Cardiac Enzymes: No results for input(s): CKTOTAL, CKMB, CKMBINDEX, TROPONINI in the last 168 hours.  Lipid Panel: No results for input(s): CHOL, TRIG, HDL, CHOLHDL, VLDL, LDLCALC in the last 168 hours.  CBG: No results for input(s): GLUCAP in the last 168 hours.  Microbiology: Results for orders placed or performed during the hospital encounter of 06/20/16  Surgical pcr screen     Status: None   Collection Time: 06/20/16  3:09 PM  Result Value Ref Range Status   MRSA, PCR NEGATIVE NEGATIVE Final   Staphylococcus aureus NEGATIVE NEGATIVE Final    Comment:        The Xpert SA Assay (  FDA approved for NASAL specimens in patients over 17 years of age), is one component of a comprehensive surveillance program.  Test performance has been validated by [facility] for patients greater than or equal to 70 year old. It is not intended to diagnose infection nor to guide or monitor treatment.     Coagulation Studies:  Recent Labs  07/03/16 1224  LABPROT 13.9  INR 1.07    Imaging: Ct Head Wo Contrast  Result Date: 07/03/2016 CLINICAL DATA:  Altered mental status today.  Right facial droop. EXAM: CT HEAD WITHOUT CONTRAST TECHNIQUE: Contiguous axial images were obtained from the base of the skull through the vertex without intravenous contrast. COMPARISON:  Head CT scan 01/03/2010. FINDINGS: Brain: Appears normal without hemorrhage, infarct, mass lesion, mass effect, midline shift or abnormal extra-axial fluid collection. No hydrocephalus or pneumocephalus. Vascular: Negative. Skull: Intact. Sinuses/Orbits: Small mucous retention cyst or polyp right maxillary sinus is unchanged. Other: None. IMPRESSION: Negative head CT. Unchanged small mucous retention cyst or polyp right maxillary sinus is noted. Electronically Signed   By: Drusilla Kanner M.D.   On: 07/03/2016 13:38        Assessment and plan discussed with with attending physician and they are in agreement.    Felicie Morn PA-C [facility] [phone]  07/03/2016, 2:33 PM   Assessment: 56 y.o. male is a 56 year old male who unfortunately has likely suffered from a stroke. Patient will require a MRI and stroke workup. Patient does have staples in his abdomen which will need to be addressed with the radiology tech's .  At this point he has been told not to take aspirin status post surgery but he is on Lovenox.  Stroke Risk Factors - none  Recommendations:\ 1. HgbA1c, fasting lipid panel 2. MRI, MRA  of the brain without contrast 3. PT consult, OT consult, Speech consult 4. Echocardiogram 5. Carotid dopplers 6. Prophylactic therapy-325 mg aspirin daily when capable 7. Risk factor modification 8. Telemetry monitoring 9. Frequent neuro checks 10 NPO until passes stroke swallow screen 11 please page stroke NP  Or  PA  Or MD from 8am -4 pm  as this patient from this time will be  followed by the stroke.   You can look them up on [email or web address]  Password [email or web address]

## 2016-07-03 NOTE — Progress Notes (Signed)
Patient admitted from ER. Patient alert and oriented x4. Patient oriented to room and made comfortable. 

## 2016-07-03 NOTE — ED Notes (Signed)
Pt returned from MRI and now to CT.  Family remains at bedside.  Pt remains alert and oriented x's 3.  Skin warm and dry, color appropriate

## 2016-07-04 ENCOUNTER — Inpatient Hospital Stay (HOSPITAL_COMMUNITY): Payer: 59

## 2016-07-04 DIAGNOSIS — S301XXA Contusion of abdominal wall, initial encounter: Secondary | ICD-10-CM

## 2016-07-04 DIAGNOSIS — R531 Weakness: Secondary | ICD-10-CM

## 2016-07-04 DIAGNOSIS — D649 Anemia, unspecified: Secondary | ICD-10-CM

## 2016-07-04 DIAGNOSIS — R29810 Facial weakness: Secondary | ICD-10-CM

## 2016-07-04 LAB — ECHOCARDIOGRAM COMPLETE
Height: 69.5 in
LCCADSYS: -121 cm/s
LEFT ECA DIAS: -17 cm/s
LEFT VERTEBRAL DIAS: -9 cm/s
LICAPDIAS: -40 cm/s
LICAPSYS: -115 cm/s
Left CCA dist dias: -20 cm/s
Left CCA prox dias: 23 cm/s
Left CCA prox sys: 130 cm/s
Left ICA dist dias: -28 cm/s
Left ICA dist sys: -70 cm/s
RIGHT ECA DIAS: -17 cm/s
RIGHT VERTEBRAL DIAS: -24 cm/s
Right CCA prox dias: 12 cm/s
Right CCA prox sys: 96 cm/s
Right cca dist sys: -75 cm/s
WEIGHTICAEL: 3136 [oz_av]

## 2016-07-04 LAB — HIV ANTIBODY (ROUTINE TESTING W REFLEX): HIV Screen 4th Generation wRfx: NONREACTIVE

## 2016-07-04 LAB — BPAM RBC
Blood Product Expiration Date: 201804062359
ISSUE DATE / TIME: 201803072205
Unit Type and Rh: 5100

## 2016-07-04 LAB — CBC
HCT: 30 % — ABNORMAL LOW (ref 39.0–52.0)
Hemoglobin: 9.5 g/dL — ABNORMAL LOW (ref 13.0–17.0)
MCH: 27.9 pg (ref 26.0–34.0)
MCHC: 31.7 g/dL (ref 30.0–36.0)
MCV: 88 fL (ref 78.0–100.0)
PLATELETS: 278 10*3/uL (ref 150–400)
RBC: 3.41 MIL/uL — AB (ref 4.22–5.81)
RDW: 14.3 % (ref 11.5–15.5)
WBC: 10.1 10*3/uL (ref 4.0–10.5)

## 2016-07-04 LAB — HEMOGLOBIN A1C
HEMOGLOBIN A1C: 6.5 % — AB (ref 4.8–5.6)
MEAN PLASMA GLUCOSE: 140 mg/dL

## 2016-07-04 LAB — TYPE AND SCREEN
ABO/RH(D): O POS
Antibody Screen: NEGATIVE
Unit division: 0

## 2016-07-04 LAB — VITAMIN B12: Vitamin B-12: 547 pg/mL (ref 180–914)

## 2016-07-04 LAB — IRON AND TIBC
Iron: 31 ug/dL — ABNORMAL LOW (ref 45–182)
Saturation Ratios: 11 % — ABNORMAL LOW (ref 17.9–39.5)
TIBC: 294 ug/dL (ref 250–450)
UIBC: 263 ug/dL

## 2016-07-04 LAB — FERRITIN: Ferritin: 175 ng/mL (ref 24–336)

## 2016-07-04 MED ORDER — PERFLUTREN LIPID MICROSPHERE
1.0000 mL | INTRAVENOUS | Status: AC | PRN
  Administered 2016-07-04: 3 mL via INTRAVENOUS
  Filled 2016-07-04: qty 10

## 2016-07-04 MED ORDER — LORAZEPAM 2 MG/ML IJ SOLN
1.0000 mg | Freq: Once | INTRAMUSCULAR | Status: AC
  Administered 2016-07-04: 1 mg via INTRAVENOUS
  Filled 2016-07-04: qty 1

## 2016-07-04 MED ORDER — ZOLPIDEM TARTRATE 5 MG PO TABS: 5.0000 mg | ORAL_TABLET | Freq: Every evening | ORAL | Status: DC | PRN

## 2016-07-04 MED ORDER — LORAZEPAM 1 MG PO TABS
1.0000 mg | ORAL_TABLET | Freq: Once | ORAL | Status: AC
  Administered 2016-07-04: 1 mg via ORAL
  Filled 2016-07-04: qty 1

## 2016-07-04 NOTE — Care Management Note (Signed)
Case Management Note  Patient Details  Name: Esperanza HeirOllis M Stamant MRN: [id number] Date of Birth: 06/20/1960  Subjective/Objective:                Patient was admitted with CVA. Lives at home with spouse. CM will follow for discharge needs pending PT/OT evals and physician orders.     Action/Plan:   Expected Discharge Date:                  Expected Discharge Plan:     In-House Referral:     Discharge planning Services     Post Acute Care Choice:    Choice offered to:     DME Arranged:    DME Agency:     HH Arranged:    HH Agency:     Status of Service:     If discussed at [employer] Length of Stay Meetings, dates discussed:    Additional Comments:  Anda KraftRobarge, Kyndall Amero C, RN 07/04/2016, 9:18 AM

## 2016-07-04 NOTE — Progress Notes (Signed)
Occupational Therapy Evaluation Patient Details Name: Darrell Watson MRN: [id number] DOB: 1961-01-30 Today's Date: 07/04/2016    History of Present Illness Pt is 56 y.o. male s/p anterior lumbar fusion L5-S1 @ 1 week ago. Pt admitted with R sided weakness. 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     Clinical Impression   PTA, pt independent with mobility and ADL and worked as a Proofreader. Pt presents with deficits listed below significantly impacting his ability to complete ADL, IADL, leisure tasks and ability to work. Pt currently recovering from recent back surgery and per wife, pt with LLE weakness. Pt now with RLE deficits as result from CVA, in addition to deficits below. Feel pt is excellent CIR candidate to maximize his functional level of independence to facilitate safe DC home.     Follow Up Recommendations  CIR;Supervision/Assistance - 24 hour    Equipment Recommendations  3 in 1 bedside commode;Tub/shower bench    Recommendations for Other Services Rehab consult     Precautions / Restrictions Precautions Precautions: Fall;Back Required Braces or Orthoses: Spinal Brace Spinal Brace: Lumbar corset;Applied in sitting position Restrictions Weight Bearing Restrictions: No      Mobility Bed Mobility Overal bed mobility: Needs Assistance             General bed mobility comments: Pt being helped to EOB by fiance when entering room .Will further assess  Transfers Overall transfer level: Needs assistance   Transfers: Sit to/from Stand Sit to Stand: Min assist         General transfer comment: R bias; poor posoitioning of R foot during sit - stand    Balance     Sitting balance-Leahy Scale: Fair     Standing balance support: During functional activity Standing balance-Leahy Scale: Poor                              ADL Overall ADL's : Needs assistance/impaired     Grooming: Minimal assistance   Upper  Body Bathing: Minimal assistance;Sitting   Lower Body Bathing: Minimal assistance;Sit to/from stand   Upper Body Dressing : Moderate assistance   Lower Body Dressing: Moderate assistance;Sit to/from stand   Toilet Transfer: Moderate assistance   Toileting- Clothing Manipulation and Hygiene: Minimal assistance       Functional mobility during ADLs: Moderate assistance General ADL Comments: Pt with R lateral bias while sitting EOB. Pt able to cross R leg over L knee with difficulty but unable to maintain that position.      Vision Patient Visual Report: No change from baseline Vision Assessment?: Yes Eye Alignment: Within Functional Limits Ocular Range of Motion: Within Functional Limits Alignment/Gaze Preference: Other (comment) (Appears to prefer L gaze, but tracked and attended to all qu) Tracking/Visual Pursuits: Able to track stimulus in all quads without difficulty Saccades: Within functional limits Convergence: Within functional limits Additional Comments: will further assess.      Perception Perception Spatial deficits: decreased awareness of midline Comments: will further assess   Praxis Praxis Praxis tested?: Within functional limits    Pertinent Vitals/Pain Pain Assessment: Faces Faces Pain Scale: Hurts a little bit Pain Location: back Pain Descriptors / Indicators: Sore Pain Intervention(s): Limited activity within patient's tolerance     Hand Dominance Left   Extremity/Trunk Assessment Upper Extremity Assessment Upper Extremity Assessment: RUE deficits/detail RUE Deficits / Details: Isolated movement present. Strength overall 4/5. "clumsy" use of R  hand. Difficulty with coordination and in-hand manipulation skills; Difficulty with finger to nose; . Brunstrom stage VI arm; V hand.  RUE Coordination: decreased fine motor;decreased gross motor; difficulty maintaining grip on RW with R hand.   Lower Extremity Assessment Lower Extremity Assessment: Defer to  PT evaluation  Appears to have difficulty positioning RLE ? Decreased proprioception; decreased sensorimotor control RLE   Cervical / Trunk Assessment Cervical / Trunk Assessment: Other exceptions Cervical / Trunk Exceptions: poor sense of midline; R lateral bias   Communication Communication Communication: Expressive difficulties (slurred)   Cognition Arousal/Alertness: Awake/alert Behavior During Therapy: WFL for tasks assessed/performed Overall Cognitive Status: Impaired/Different from baseline Area of Impairment: Attention;Awareness;Problem solving   Current Attention Level: Selective       Awareness: Emergent Problem Solving: Slow processing General Comments: will further assess. Pt had 2 falls at home PTA and leaning to R on therapist adn thinks he will be OK to DC home   General Comments       Exercises       Shoulder Instructions      Home Living Family/patient expects to be discharged to:: Private residence Living Arrangements: Spouse/significant other Available Help at Discharge: Family;Available PRN/intermittently Type of Home: House Home Access: Stairs to enter Entergy CorporationEntrance Stairs-Number of Steps: 1 Entrance Stairs-Rails: None Home Layout: One level     Bathroom Shower/Tub: Tub/shower unit Shower/tub characteristics: Engineer, building servicesCurtain Bathroom Toilet: Standard Bathroom Accessibility: Yes How Accessible: Accessible via walker Home Equipment: Gilmer Morane - single point   Additional Comments: 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 Drove. works out regularly.   Lives With: Spouse    Prior Functioning/Environment Level of Independence: Independent                 OT Problem List: Decreased strength;Impaired balance (sitting and/or standing);Decreased coordination;Decreased safety awareness;Decreased knowledge of use of DME or AE;Decreased knowledge of precautions;Impaired tone;Impaired UE functional use      OT Treatment/Interventions:  Self-care/ADL training;Therapeutic exercise;Neuromuscular education;DME and/or AE instruction;Therapeutic activities;Visual/perceptual remediation/compensation;Patient/family education;Balance training    OT Goals(Current goals can be found in the care plan section) Acute Rehab OT Goals Patient Stated Goal: Return home OT Goal Formulation: With patient Time For Goal Achievement: 07/18/16 Potential to Achieve Goals: Good ADL Goals Pt Will Perform Grooming: with modified independence;sitting Pt Will Perform Upper Body Bathing: with modified independence;sitting Pt Will Perform Lower Body Bathing: with modified independence;sit to/from stand Pt Will Perform Upper Body Dressing: with modified independence;sitting Pt Will Perform Lower Body Dressing: with modified independence;sit to/from stand Pt Will Transfer to Toilet: with modified independence;ambulating  OT Frequency: Min 2X/week   Barriers to D/C:            Co-evaluation              End of Session Equipment Utilized During Treatment: Back brace;Rolling walker Nurse Communication: Mobility status;Precautions  Activity Tolerance: Patient tolerated treatment well Patient left: in chair;with call bell/phone within reach;with family/visitor present  OT Visit Diagnosis: Unsteadiness on feet (R26.81);Other symptoms and signs involving the nervous system (R29.898)                ADL either performed or assessed with clinical judgement  Time: 1205-1237 OT Time Calculation (min): 32 min Charges:  OT General Charges $OT Visit: 1 Procedure OT Evaluation $OT Eval Moderate Complexity: 1 Procedure OT Treatments $Self Care/Home Management : 8-22 mins G-Codes:     E [facility] Centeril[facility] Ayris Carano, OT/L  [phone] 07/04/2016  Hareem Surowiec,HILLARY 07/04/2016, 3:13 PM

## 2016-07-04 NOTE — Evaluation (Signed)
Clinical/Bedside Swallow Evaluation Patient Details  Name: Darrell Watson MRN: [id number] Date of Birth: [birth year]  Today's Date: 07/04/2016 Time: SLP Start Time (ACUTE ONLY): 0847 SLP Stop Time (ACUTE ONLY): 0857 SLP Time Calculation (min) (ACUTE ONLY): 10 min  Past Medical History:  Past Medical History:  Diagnosis Date  . Anemia   . Arthritis   . Tachycardia    Past Surgical History:  Past Surgical History:  Procedure Laterality Date  . ABDOMINAL EXPOSURE N/A 06/27/2016   Procedure: ABDOMINAL EXPOSURE;  Surgeon: Chuck Hint, MD;  Location: [facility] OR;  Service: Vascular;  Laterality: N/A;  . ANTERIOR LUMBAR FUSION N/A 06/27/2016   Procedure: ANTERIOR LUMBAR FUSION L5-S1;  Surgeon: Venita Lick, MD;  Location: [facility] OR;  Service: Orthopedics;  Laterality: N/A;  Requests 3 hours  . FINGER AMPUTATION Left   . SHOULDER SURGERY     HPI:  Pt is a 56 y.o.malewith PMH significant for recent L5-S1 fusion anterior approach, DDD presents to the emergency department with the chief complaint of right facial droop, slurred speech, and right-sided weakness. MRI of brain showed small acute infarct within the left posterior limb of internal capsule and punctate acute infarct in the right temporal operculum. T2 hyperintense focus in right hemi pons, possibly chronic lacunar infarct or prominent perivascular space.   Assessment / Plan / Recommendation Clinical Impression  Darrell Watson was somewhat drowsy and yawned throughout bedside swallow eval. Observed right facial droop/weakness during oral motor assessment. Noted anterior spillage with thin liquids via cup but not via straw; there were no overt s/s of aspiration or airway compromise with thin liquids. Mastication of regular solids was timely with no oral or pharyngeal phase impairments. Educated pt and spouse re: diet recommendations and safe swallow precautions (slow rate, small sips/bites, sit upright during mealtime). Recommended regular solids,  thin liquids (straws ok), meds whole with thin liquids. No dysphagia treatment necessary at this time; ST signing off. SLP Visit Diagnosis: Dysphagia, unspecified (R13.10)    Aspiration Risk  Mild aspiration risk    Diet Recommendation Regular;Thin liquid   Liquid Administration via: Cup;Straw Medication Administration: Whole meds with liquid Supervision: Patient able to self feed Compensations: Slow rate;Small sips/bites Postural Changes: Seated upright at 90 degrees    Other  Recommendations Oral Care Recommendations: Oral care BID   Follow up Recommendations Inpatient Rehab;Other (comment) (Inpatient Rehab for cognitive deficits)      Frequency and Duration            Prognosis Prognosis for Safe Diet Advancement: Good      Swallow Study   General HPI: Pt is a 56 y.o.malewith PMH significant for recent L5-S1 fusion anterior approach, DDD presents to the emergency department with the chief complaint of right facial droop, slurred speech, and right-sided weakness. MRI of brain showed small acute infarct within the left posterior limb of internal capsule and punctate acute infarct in the right temporal operculum. T2 hyperintense focus in right hemi pons, possibly chronic lacunar infarct or prominent perivascular space. Type of Study: Bedside Swallow Evaluation Previous Swallow Assessment: none noted Diet Prior to this Study: NPO Temperature Spikes Noted: No Respiratory Status: Room air History of Recent Intubation: No Behavior/Cognition: Alert;Cooperative;Other (Comment) (mildly drowsy/yawning) Oral Cavity Assessment: Within Functional Limits Oral Care Completed by SLP: No Oral Cavity - Dentition: Adequate natural dentition Vision: Functional for self-feeding Self-Feeding Abilities: Able to feed self Patient Positioning: Upright in bed Baseline Vocal Quality: Low vocal intensity Volitional Cough: Strong Volitional Swallow: Able to elicit  Oral/Motor/Sensory Function  Overall Oral Motor/Sensory Function: Mild impairment Facial ROM: Reduced right Facial Symmetry: Abnormal symmetry right Facial Strength: Within Functional Limits Lingual ROM: Within Functional Limits Lingual Symmetry: Within Functional Limits Lingual Strength: Within Functional Limits Velum: Within Functional Limits   Ice Chips Ice chips: Not tested   Thin Liquid Thin Liquid: Impaired Presentation: Cup;Straw Oral Phase Impairments: Reduced labial seal Oral Phase Functional Implications: Right anterior spillage;Left anterior spillage Pharyngeal  Phase Impairments:  (none)    Nectar Thick Nectar Thick Liquid: Not tested   Honey Thick Honey Thick Liquid: Not tested   Puree Puree: Not tested   Solid   GO   Solid: Within functional limits Presentation: Self Fed        Occidental PetroleumMeredith Zahari Watson , Student-SLP 07/04/2016,10:57 AM

## 2016-07-04 NOTE — Progress Notes (Signed)
STROKE TEAM PROGRESS NOTE   SUBJECTIVE (INTERVAL HISTORY) Darrell Watson is at the bedside.  He is just back from vascular lab. He is up in the chair, eating lunch, talkative.    OBJECTIVE Temp:  [98.5 F (36.9 C)-99.1 F (37.3 C)] 98.6 F (37 C) (03/08 0800) Pulse Rate:  [105-117] 113 (03/08 0800) Cardiac Rhythm: Sinus tachycardia (03/08 0700) Resp:  [18-28] 18 (03/08 0800) BP: (93-152)/(52-96) 112/52 (03/08 0800) SpO2:  [93 %-100 %] 99 % (03/08 0800) Weight:  [88.9 kg (196 lb)] 88.9 kg (196 lb) (03/07 2008)  CBC:   Recent Labs Lab 07/03/16 1224 07/04/16 0345  WBC 8.7 10.1  NEUTROABS 7.0  --   HGB 8.3* 9.5*  HCT 26.3* 30.0*  MCV 88.0 88.0  PLT 295 278    Basic Metabolic Panel:   Recent Labs Lab 06/27/16 1439 07/03/16 1224  NA  --  138  K  --  3.6  CL  --  98*  CO2  --  30  GLUCOSE  --  143*  BUN  --  9  CREATININE 1.44* 1.22  CALCIUM  --  8.9    PHYSICAL EXAM Pleasant well-built middle-aged African-American male currently not in distress. . Afebrile. Head is nontraumatic. Neck is supple without bruit.    Cardiac exam no murmur or gallop. Lungs are clear to auscultation. Distal pulses are well felt. 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 Mild right lower face asymmetry. Tongue midline. No drift. Mild diminished fine finger movements on right Orbits right over left upper extremity. Mild right grip and hip flexorweakness.. Normal sensation . Normal coordination.  ASSESSMENT/PLAN [name] is a 56 y.o. male with history of recent L5-S1 repair presenting with R sided weakness. He did not receive IV t-PA due to delay in arrival.   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 L PLIC felt due to small vessel disease source. The R temporal infarct source unknown  CT head negative. R maxillary retention cyst/polyp  MRI  Small L PLIC infarct and punctate R temporal operculum  infarct. Dermal lesion R scalp overlying parietal  MRA  Unremarkable   Carotid Doppler  1-39% B ICA, VA antegrade  2D Echo  pending   LDL 39  HgbA1c 6.5  Lovenox 40 mg sq daily for VTE prophylaxis Diet regular Room service appropriate? Yes; Fluid consistency: Thin  No antithrombotic prior to admission, now on No antithrombotic give rectus hematoma  Not a candidate for further embolic workup as he is not an anticoagulation candidate d/t to rectus sheath/psoas hematoma  Therapy recommendations:  pending   Disposition:  pending   Other Stroke Risk Factors  ETOH use  Overweight, Body mass index is 28.53 kg/m.  Other Active Problems  Tachycardia  UDS positive for opiates  L rectus hematoma, L psoas hematoma with Anemia 15.2->8.3 post op  Back pain s/p L5-S1 fusion  Rhoderick MoodyBIBY,SHARON  [facility] See Amion for Pager information 07/04/2016 12:55 PM  I have personally examined this patient, reviewed notes, independently viewed imaging studies, participated in medical decision making and plan of care.ROS completed by me personally and pertinent positives fully documented  I have made any additions or clarifications directly to the above note. Agree with note above.  He presented with mild right-sided weakness secondary to small lacunar left subcortical infarct. There is a tiny right temporal cortical infarct as well which is  of unclear significance and clinically silent. Recommend hold aspirin for stroke prevention, for at least 1 week given patient's history of intramuscular back hematoma following back surgery one week ago and low hematocrit continue ongoing stroke workup and aggressive risk factor modification. Greater than 50% time during this 35 minute visit was spent on counseling the patient, wife and son about Darrell stroke, risk discussion about evaluation and treatment plan and answering questions  Delia Heady, MD Medical Director [facility] Pager:  [phone] 07/04/2016 2:25 PM  To contact Stroke Continuity provider, please refer to [email or web address]. After hours, contact General Neurology

## 2016-07-04 NOTE — Evaluation (Signed)
Physical Therapy Evaluation Patient Details Name: Darrell Watson MRN: [id number] DOB: 1961/02/28 Today's Date: 07/04/2016   History of Present Illness  Pt is 56 y.o. male s/p anterior lumbar fusion L5-S1 @ 1 week ago. Pt admitted with R sided weakness. 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    Clinical Impression  Patient presents with decreased mobility due to weakness R side, ataxia on L LE, decreased balance and high fall risk even with assist and walker.  Patient independent prior to back surgery and was independent prior to d/c from hospital after back surgery.  Currently mod A for all mobility. Recommend CIR level rehab at d/c prior to d/c home with family support.     Follow Up Recommendations CIR    Equipment Recommendations  Rolling walker with 5" wheels    Recommendations for Other Services Rehab consult     Precautions / Restrictions Precautions Precautions: Fall;Back Required Braces or Orthoses: Spinal Brace Spinal Brace: Lumbar corset;Applied in sitting position Restrictions Weight Bearing Restrictions: No      Mobility  Bed Mobility Overal bed mobility: Needs Assistance             General bed mobility comments: up in chair  Transfers Overall transfer level: Needs assistance Equipment used: Rolling walker (2 wheeled) Transfers: Sit to/from Stand Sit to Stand: Mod assist;+2 safety/equipment         General transfer comment: lifting assist out of chair due to R foot positioned to L of midline and forward. Family also assisting  Ambulation/Gait Ambulation/Gait assistance: Mod assist Ambulation Distance (Feet): 80 Feet Assistive device: Rolling walker (2 wheeled) Gait Pattern/deviations: Step-to pattern;Shuffle;Trunk flexed;Decreased stride length;Decreased dorsiflexion - right;Decreased stance time - right;Ataxic;Wide base of support     General Gait Details: Dragging R foot on floor during swing, positions somtimes too far  medially, noted with gait out of room L LE ataxic during swing with tremors, but intact placement  Stairs            Wheelchair Mobility    Modified Rankin (Stroke Patients Only) Modified Rankin (Stroke Patients Only) Pre-Morbid Rankin Score: Moderate disability Modified Rankin: Moderately severe disability     Balance Overall balance assessment: Needs assistance   Sitting balance-Leahy Scale: Fair     Standing balance support: Bilateral upper extremity supported Standing balance-Leahy Scale: Poor Standing balance comment: UE support for balance with poor positioning at times of R foot, though testing demonstrates intact proprioception,                              Pertinent Vitals/Pain Pain Assessment: Faces Faces Pain Scale: Hurts a little bit Pain Location: back Pain Descriptors / Indicators: Sore Pain Intervention(s): Limited activity within patient's tolerance    Home Living Family/patient expects to be discharged to:: Private residence Living Arrangements: Spouse/significant other Available Help at Discharge: Family;Available PRN/intermittently Type of Home: House Home Access: Stairs to enter Entrance Stairs-Rails: None Entrance Stairs-Number of Steps: 1 Home Layout: One level Home Equipment: Cane - single point Additional Comments: 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 Drove. works out regularly.     Prior Function Level of Independence: Independent               Hand Dominance   Dominant Hand: Left    Extremity/Trunk Assessment   Upper Extremity Assessment Upper Extremity Assessment: Defer to OT evaluation RUE Deficits / Details: Isolated movement  present. Strength overall 4/5. "clumsy" use of R hand. Difficulty with coordination and in-hand manipulation skills; Difficulty with finger to nose; . Brunstrom stage VI arm; V hand.  RUE Coordination: decreased fine motor;decreased gross motor    Lower Extremity  Assessment Lower Extremity Assessment: RLE deficits/detail;LLE deficits/detail RLE Deficits / Details: AROM WFL except limited DF due to weakness and stiffness with positive clonus.  Strength hip flexion 4-/5, knee extension 4/5, ankle DF 3-/5 LLE Deficits / Details: AROM WFL, positive clonus, strength hip flexion 4+/5, knee extension 5/5, ankle DF 4+/5, coordination intact heel to shin and toe taps, but note ataxic movements during gait    Cervical / Trunk Assessment Cervical / Trunk Assessment: Other exceptions Cervical / Trunk Exceptions: poor sense of midline; R lateral bias  Communication   Communication: Expressive difficulties (slurred)  Cognition Arousal/Alertness: Awake/alert Behavior During Therapy: WFL for tasks assessed/performed Overall Cognitive Status: Impaired/Different from baseline Area of Impairment: Attention;Awareness;Problem solving   Current Attention Level: Selective       Awareness: Emergent Problem Solving: Slow processing;Decreased initiation;Difficulty sequencing General Comments: will further assess. Pt had 2 falls at home PTA and leaning to R on therapist adn thinks he will be OK to DC home    General Comments General comments (skin integrity, edema, etc.): Family in the room and very attentive assisting with up to bathroom    Exercises     Assessment/Plan    PT Assessment Patient needs continued PT services  PT Problem List Decreased strength       PT Treatment Interventions Gait training;Functional mobility training;Balance training;Therapeutic exercise;Patient/family education;DME instruction;Stair training;Therapeutic activities    PT Goals (Current goals can be found in the Care Plan section)  Acute Rehab PT Goals Patient Stated Goal: Go to rehab then home PT Goal Formulation: With patient/family Time For Goal Achievement: 07/18/16 Potential to Achieve Goals: Good    Frequency Min 4X/week   Barriers to discharge         Co-evaluation               End of Session Equipment Utilized During Treatment: Gait belt;Back brace Activity Tolerance: Patient tolerated treatment well Patient left: in chair;with call bell/phone within reach;with family/visitor present   PT Visit Diagnosis: Ataxic gait (R26.0);Hemiplegia and hemiparesis;Other abnormalities of gait and mobility (R26.89) Hemiplegia - Right/Left: Right Hemiplegia - dominant/non-dominant: Dominant Hemiplegia - caused by: Cerebral infarction         Time: [id number] PT Time Calculation (min) (ACUTE ONLY): 27 min   Charges:   PT Evaluation $PT Eval Moderate Complexity: 1 Procedure PT Treatments $Gait Training: 8-22 mins   PT G CodesElray Mcgregor:         Cynthia Wynn 07/04/2016, 4:26 PM  Sheran Lawlessyndi Wynn, PT [phone] 07/04/2016

## 2016-07-04 NOTE — Progress Notes (Addendum)
PROGRESS NOTE    Geanie CooleyOllis M Zaccone  ZOX:[id number] DOB: [birth year] DOA: 07/03/2016 PCP: Katy ApoPOLITE,RONALD D, MD   Brief Narrative: Darrell Watson is a 56 y.o. male with a history of recent L5-S1 fuson, degenerative disc disease, tachycardia. He presented with facial droop and right-sided weakness and found to have a stroke.  Assessment & Plan:   Principal Problem:   Stroke [facility](HCC) Active Problems:   Back pain   Anemia   Tachycardia   Right sided weakness   Facial droop  CVA MRI significant for small acute infarct posterior limb of internal capsule on the left and punctate acute infarct of the right temporal operculum -Neurology recommendations -PT recommendations: CIR -Echocardiogram -Carotid dopplers  Anemia S/p 1u PRBC. Hemoglobin up today. Secondary to two abdominal hematomas. -CBC in AM  Hematomas Located below rectus sheath and left psoas muscles. Secondary to recent surgery -orthopedic surgery consulted: vascular to see in AM. Okay to hold Lovenox on discharge -CBC in AM  Tachycardia At baseline. Currently stable. Sinus tachycardia.  L5-S1 fusion Patient was discharged on 3/6. -orthopedic surgery consulted  Lung lesions Seen on CT abdomen. Recommendation for follow-up at 3-6 months with non-contrast chest CT   DVT prophylaxis: Lovenox Code Status: Full code Family Communication: Fiance and son at bedside Disposition Plan: Discharge pending stroke workup and CIR admission   Consultants:   Neurology  CIR  Procedures:  Echocardiogram (3/8)  Carotid dopplers (3/8)  Antimicrobials:   None    Subjective: Patient reports improvement in speech but not back to baseline. No chest pain or dyspnea.  Objective: Vitals:   07/04/16 0200 07/04/16 0400 07/04/16 0600 07/04/16 0800  BP: 114/82 124/69 128/65 (!) 112/52  Pulse: (!) 116 (!) 105 (!) 105 (!) 113  Resp: 18 18 18 18   Temp:    98.6 F (37 C)  TempSrc:    Oral  SpO2: 100% 99% 99% 99%  Weight:        Height:        Intake/Output Summary (Last 24 hours) at 07/04/16 1332 Last data filed at 07/04/16 0800  Gross per 24 hour  Intake              335 ml  Output              500 ml  Net             -165 ml   Filed Weights   07/03/16 2008  Weight: 88.9 kg (196 lb)    Examination:  General exam: Appears calm and comfortable Respiratory system: Clear to auscultation. Respiratory effort normal. Cardiovascular system: S1 & S2 heard, RRR. No murmurs, rubs, gallops or clicks. Gastrointestinal system: Abdomen is nondistended, soft and tender over left rectus muscles with mass felt in that area. Normal bowel sounds heard. Central nervous system: Alert and oriented x2. Right facial drop with flattening of nasal fold Extremities: No edema. No calf tenderness Skin: No cyanosis. No rashes Psychiatry: Judgement and insight appear normal. Mood & affect appropriate.     Data Reviewed: I have personally reviewed following labs and imaging studies  CBC:  Recent Labs Lab 06/27/16 1439 07/03/16 1224 07/04/16 0345  WBC 12.4* 8.7 10.1  NEUTROABS  --  7.0  --   HGB 15.2 8.3* 9.5*  HCT 46.6 26.3* 30.0*  MCV 86.6 88.0 88.0  PLT 244 295 278   Basic Metabolic Panel:  Recent Labs Lab 06/27/16 1439 07/03/16 1224  NA  --  138  K  --  3.6  CL  --  98*  CO2  --  30  GLUCOSE  --  143*  BUN  --  9  CREATININE 1.44* 1.22  CALCIUM  --  8.9   GFR: Estimated Creatinine Clearance: 75.3 mL/min (by C-G formula based on SCr of 1.22 mg/dL). Liver Function Tests:  Recent Labs Lab 07/03/16 1224  AST 43*  ALT 26  ALKPHOS 48  BILITOT 1.3*  PROT 6.8  ALBUMIN 3.5   No results for input(s): LIPASE, AMYLASE in the last 168 hours. No results for input(s): AMMONIA in the last 168 hours. Coagulation Profile:  Recent Labs Lab 07/03/16 1224  INR 1.07   Cardiac Enzymes: No results for input(s): CKTOTAL, CKMB, CKMBINDEX, TROPONINI in the last 168 hours. BNP (last 3 results) No results for  input(s): PROBNP in the last 8760 hours. HbA1C:  Recent Labs  07/03/16 1224  HGBA1C 6.5*   CBG: No results for input(s): GLUCAP in the last 168 hours. Lipid Profile:  Recent Labs  07/03/16 1224  CHOL 106  HDL 45  LDLCALC 39  TRIG 111  CHOLHDL 2.4   Thyroid Function Tests: No results for input(s): TSH, T4TOTAL, FREET4, T3FREE, THYROIDAB in the last 72 hours. Anemia Panel:  Recent Labs  07/03/16 1224 07/03/16 1839  FOLATE  --  20.0  RETICCTPCT 4.8*  --    Sepsis Labs: No results for input(s): PROCALCITON, LATICACIDVEN in the last 168 hours.  No results found for this or any previous visit (from the past 240 hour(s)).       Radiology Studies: Ct Abdomen Pelvis Wo Contrast  Addendum Date: 07/03/2016   ADDENDUM REPORT: 07/03/2016 17:46 ADDENDUM: Nodular densities as stated in the report along the major fissure on the right may represent small intrafissural lymph nodes. There are right lower lobe 6 and 7 mm nodular densities for which follow-up is recommended. Non-contrast chest CT at 3-6 months is recommended. If the nodules are stable at time of repeat CT, then future CT at 18-24 months (from today's scan) is considered optional for low-risk patients, but is recommended for high-risk patients. This recommendation follows the consensus statement: Guidelines for Management of Incidental Pulmonary Nodules Detected on CT Images: From the Fleischner Society 2017; Radiology 2017; 284:228-243. Electronically Signed   By: Tollie Eth M.D.   On: 07/03/2016 17:46   Result Date: 07/03/2016 CLINICAL DATA:  Anemia, history of recent back surgery 06/27/2016 EXAM: CT ABDOMEN AND PELVIS WITHOUT CONTRAST TECHNIQUE: Multidetector CT imaging of the abdomen and pelvis was performed following the standard protocol without IV contrast. COMPARISON:  06/28/2016 radiographs of the lumbar spine FINDINGS: Lower chest: Under nodular densities along the right major fissure and right lower lobe possibly  representing intrafissural lymph nodes. There are 6 and 7 mm nodular densities in the right lower lobe. No pulmonary consolidation. Minimal pleural thickening and/or atelectasis posteriorly at the left lung base. Hepatobiliary: 6 mm hypodensity in the left hepatic lobe may represent a small cyst or hemangioma. Further characterization not possible given lack of IV contrast. Gallbladder is unremarkable. No biliary dilatation. Pancreas: Normal Spleen: Normal Adrenals/Urinary Tract: No nephrolithiasis nor obstructive uropathy. No focal renal mass identified. Contracted bladder. Stomach/Bowel: Stomach is within normal limits. Appendix appears normal. No evidence of bowel wall thickening, distention, or inflammatory changes. Vascular/Lymphatic: Small retroperitoneal periaortic and mesenteric lymph nodes, the largest approximately 10 mm short axis. No aneurysm. Reproductive: Prostate is unremarkable. Other: Hematomas of the left lower rectus muscle and superficially over the left psoas muscle  are identified, the left psoas measuring 4.6 x 9 x 10.2 cm in the left rectus measuring approximately 7 x 3.4 x 9 cm. Overlying subcutaneous emphysema in the left lower quadrant with postsurgical change noted. Tiny foci of air noted adjacent to the left psoas hematoma believed to be within adjacent bowel or related to postsurgical change. An evolving abscess cannot be entirely excluded. Musculoskeletal: L5-S1 fusion hardware is noted. IMPRESSION: 1. Intramuscular hematoma of the left rectus muscle measuring 7 x 3.4 x 9 cm and overlying the left psoas measuring 10.2 x 9 x 4.6 cm. Adjacent tiny foci of gas are believed to be post pop related or potentially due to adjacent non-opacified small bowel adjacent to the left psoas hematoma. Cannot exclude the possibility of a developing abscess. Clinical correlation and follow-up is suggested. 2. L5-S1 fusion. Electronically Signed: By: Tollie Eth M.D. On: 07/03/2016 17:36   Dg Chest 2  View  Result Date: 07/03/2016 CLINICAL DATA:  Clinical signs of a CVA today. History of tachycardia. EXAM: CHEST  2 VIEW COMPARISON:  Portable chest x-rays of March 30, 2015 FINDINGS: The lungs are well-expanded and clear. The heart and pulmonary vascularity are normal. The mediastinum is normal in width. There is no pleural effusion. The bony thorax is unremarkable. IMPRESSION: There is no active cardiopulmonary disease. Electronically Signed   By: David  [place] M.D.   On: 07/03/2016 15:08   Ct Head Wo Contrast  Result Date: 07/03/2016 CLINICAL DATA:  Altered mental status today.  Right facial droop. EXAM: CT HEAD WITHOUT CONTRAST TECHNIQUE: Contiguous axial images were obtained from the base of the skull through the vertex without intravenous contrast. COMPARISON:  Head CT scan 01/03/2010. FINDINGS: Brain: Appears normal without hemorrhage, infarct, mass lesion, mass effect, midline shift or abnormal extra-axial fluid collection. No hydrocephalus or pneumocephalus. Vascular: Negative. Skull: Intact. Sinuses/Orbits: Small mucous retention cyst or polyp right maxillary sinus is unchanged. Other: None. IMPRESSION: Negative head CT. Unchanged small mucous retention cyst or polyp right maxillary sinus is noted. Electronically Signed   By: Drusilla Kanner M.D.   On: 07/03/2016 13:38   Mr Brain Wo Contrast  Result Date: 07/03/2016 CLINICAL DATA:  56 y/o M; right leg weakness, facial droop, and slurred speech. EXAM: MRI HEAD WITHOUT CONTRAST MRA HEAD WITHOUT CONTRAST TECHNIQUE: Multiplanar, multiecho pulse sequences of the brain and surrounding structures were obtained without intravenous contrast. Angiographic images of the head were obtained using MRA technique without contrast. COMPARISON:  None. 07/03/2016 CT of the head. FINDINGS: MRI HEAD FINDINGS Brain: 10 x 6 mm focus of low diffusivity within the left posterior limb of internal capsule (series 4, image 22). 3 mm focus of low diffusivity within the right  temporal operculum (Series 4, image 19). No additional diffusion signal abnormality. T2 hyperintense focus in the right hemi pons. No focal mass effect, intracranial hemorrhage, hydrocephalus, or extra-axial collection. Vascular: As below. Skull and upper cervical spine: Normal marrow signal. Sinuses/Orbits: Mild anterior ethmoid sinus mucosal thickening and small mucous retention cyst in the right maxillary sinus. No abnormal signal of mastoid air cells. Orbits are unremarkable. Other: Right superolateral low signal dermal lesion overlying parietal bone measuring up to 10 mm (series 10, image 12). Direct visualization recommended. MRA HEAD FINDINGS Internal carotid arteries:  Patent. Anterior cerebral arteries:  Patent. Middle cerebral arteries: Patent. Anterior communicating artery: Patent. Posterior communicating arteries: Small patent left. No right identified, likely hypoplastic or absent. Posterior cerebral arteries:  Patent. Basilar artery:  Patent. Vertebral arteries:  Patent.  No evidence of high-grade stenosis, large vessel occlusion, or aneurysm. IMPRESSION: 1. 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 No associated hemorrhage. Different vascular territories suggests embolic etiology. 2. T2 hyperintense focus in right hemi pons, possibly chronic lacunar infarct or prominent perivascular space. 3. Mild paranasal sinus disease. 4. Dermal lesion in right superolateral scalp overlying parietal bone, direct visualization recommended. 5. Normal MRA of the head without contrast. These results will be called to the ordering clinician or representative by the Radiologist Assistant, and communication documented in the PACS or zVision Dashboard. Electronically Signed   By: Mitzi Hansen M.D.   On: 07/03/2016 16:40   Mr Maxine Glenn Head/brain ZO Cm  Result Date: 07/03/2016 CLINICAL DATA:  56 y/o M; right leg weakness, facial  droop, and slurred speech. EXAM: MRI HEAD WITHOUT CONTRAST MRA HEAD WITHOUT CONTRAST TECHNIQUE: Multiplanar, multiecho pulse sequences of the brain and surrounding structures were obtained without intravenous contrast. Angiographic images of the head were obtained using MRA technique without contrast. COMPARISON:  None. 07/03/2016 CT of the head. FINDINGS: MRI HEAD FINDINGS Brain: 10 x 6 mm focus of low diffusivity within the left posterior limb of internal capsule (series 4, image 22). 3 mm focus of low diffusivity within the right temporal operculum (Series 4, image 19). No additional diffusion signal abnormality. T2 hyperintense focus in the right hemi pons. No focal mass effect, intracranial hemorrhage, hydrocephalus, or extra-axial collection. Vascular: As below. Skull and upper cervical spine: Normal marrow signal. Sinuses/Orbits: Mild anterior ethmoid sinus mucosal thickening and small mucous retention cyst in the right maxillary sinus. No abnormal signal of mastoid air cells. Orbits are unremarkable. Other: Right superolateral low signal dermal lesion overlying parietal bone measuring up to 10 mm (series 10, image 12). Direct visualization recommended. MRA HEAD FINDINGS Internal carotid arteries:  Patent. Anterior cerebral arteries:  Patent. Middle cerebral arteries: Patent. Anterior communicating artery: Patent. Posterior communicating arteries: Small patent left. No right identified, likely hypoplastic or absent. Posterior cerebral arteries:  Patent. Basilar artery:  Patent. Vertebral arteries:  Patent. No evidence of high-grade stenosis, large vessel occlusion, or aneurysm. IMPRESSION: 1. 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 No associated hemorrhage. Different vascular territories suggests embolic etiology. 2. T2 hyperintense focus in right hemi pons, possibly chronic lacunar infarct or prominent perivascular  space. 3. Mild paranasal sinus disease. 4. Dermal lesion in right superolateral scalp overlying parietal bone, direct visualization recommended. 5. Normal MRA of the head without contrast. These results will be called to the ordering clinician or representative by the Radiologist Assistant, and communication documented in the PACS or zVision Dashboard. Electronically Signed   By: Mitzi Hansen M.D.   On: 07/03/2016 16:40        Scheduled Meds: .  stroke: mapping our early stages of recovery book   Does not apply Once  . sodium chloride   Intravenous Once  . enoxaparin (LOVENOX) injection  40 mg Subcutaneous Q24H  . senna-docusate  1 tablet Oral BID   Continuous Infusions:   LOS: 1 day     Jacquelin Hawking [facility] 07/04/2016, 1:32 PM Pager: [phone]  If 7PM-7AM, please contact night-coverage [email or web address] Password [email or web address] 07/04/2016, 1:32 PM

## 2016-07-04 NOTE — Progress Notes (Signed)
  Echocardiogram 2D Echocardiogram has been performed with definity.  Marisue Humblelexis N Gracia Saggese 07/04/2016, 12:04 PM

## 2016-07-04 NOTE — Progress Notes (Signed)
Rehab Admissions Coordinator Note:  Patient was screened by Clois DupesBoyette, Jerardo Costabile Godwin for appropriateness for an Inpatient Acute Rehab Consult per SLP and OT recommendations.  At this time, we are recommending Inpatient Rehab consult.  Clois DupesBoyette, Yatzil Clippinger Godwin 07/04/2016, 4:02 PM  I can be reached at [phone].

## 2016-07-04 NOTE — Progress Notes (Signed)
VASCULAR LAB PRELIMINARY  PRELIMINARY  PRELIMINARY  PRELIMINARY  Carotid duplex completed.    Preliminary report:  1-39% ICA plaquing.  Vertebral artery flow is antegrade.   Gretna Bergin, RVT 07/04/2016, 11:35 AM

## 2016-07-04 NOTE — Evaluation (Signed)
Speech Language Pathology Evaluation Patient Details Name: Darrell Watson MRN: [id number] DOB: 07-Jun-1960 Today's Date: 07/04/2016 Time: 4098-1191 SLP Time Calculation (min) (ACUTE ONLY): 30 min  Problem List:  Patient Active Problem List   Diagnosis Date Noted  . Right sided weakness 07/03/2016  . Facial droop 07/03/2016  . Stroke (HCC) 07/03/2016  . Anemia   . Tachycardia   . Back pain 06/27/2016   Past Medical History:  Past Medical History:  Diagnosis Date  . Anemia   . Arthritis   . Tachycardia    Past Surgical History:  Past Surgical History:  Procedure Laterality Date  . ABDOMINAL EXPOSURE N/A 06/27/2016   Procedure: ABDOMINAL EXPOSURE;  Surgeon: Chuck Hint, MD;  Location: [facility] OR;  Service: Vascular;  Laterality: N/A;  . ANTERIOR LUMBAR FUSION N/A 06/27/2016   Procedure: ANTERIOR LUMBAR FUSION L5-S1;  Surgeon: Venita Lick, MD;  Location: [facility] OR;  Service: Orthopedics;  Laterality: N/A;  Requests 3 hours  . FINGER AMPUTATION Left   . SHOULDER SURGERY     HPI:  Pt is a 56 y.o.malewith PMH significant for recent L5-S1 fusion anterior approach, DDD presents to the emergency department with the chief complaint of right facial droop, slurred speech, and right-sided weakness. MRI of brain showed small acute infarct within the left posterior limb of internal capsule and punctate acute infarct in the right temporal operculum. T2 hyperintense focus in right hemi pons, possibly chronic lacunar infarct or prominent perivascular space.   Assessment / Plan / Recommendation Clinical Impression  Darrell Watson appeared mildly drowsy, possibly due to medication (Ativan early am per fiance). Speech intelligibility was slightly reduced (low vocal intensity and articulation). He currently lives with his fiance, works full-time, and handles the household finances. The MoCA was administered to assess the pt's language and cognitive abilities. He achieved an overall score of 18/30 with  significant impairments in memory/delayed recall and attention. Memory impairments are likely due to storage and retrieval deficits (he was unable to recall any of the words even when provided with category and multiple choice cues). He exhibited mild deficits in language and orientation (not oriented to current year). Pt exhibited difficulties during the fluency subtest for language-repetition was not impaired. Pt reported that he does notice a change in cognition and memory since his stroke. Both pt and fiance stated that they believe that rehab would be beneficial to address cognitive deficits (SLP in agreement and recommended in-patient rehab). ST will continue to f/u for treatment to target attention, memory, and orientation.     SLP Assessment  SLP Recommendation/Assessment: Patient needs continued Speech Lanaguage Pathology Services SLP Visit Diagnosis: Cognitive communication deficit (R41.841) Attention and concentration deficit following: Cerebral infarction    Follow Up Recommendations  Inpatient Rehab    Frequency and Duration min 2x/week  2 weeks      SLP Evaluation Cognition  Overall Cognitive Status: Impaired/Different from baseline Arousal/Alertness: Suspect due to medications (drowsy from Ativan; per fiance) Orientation Level: Oriented to place;Oriented to person;Oriented to situation;Disoriented to time Attention: Sustained Sustained Attention: Impaired Sustained Attention Impairment: Verbal basic Memory: Impaired Memory Impairment: Storage deficit;Retrieval deficit;Decreased short term memory Decreased Short Term Memory: Verbal basic Awareness: Appears intact Problem Solving:  (not tested, suspect higher level impaired) Safety/Judgment: Appears intact       Comprehension  Auditory Comprehension Overall Auditory Comprehension: Appears within functional limits for tasks assessed Yes/No Questions: Not tested Commands: Not tested Conversation: Complex Interfering  Components: Attention Visual Recognition/Discrimination Discrimination: Not tested Reading Comprehension  Reading Status: Not tested    Expression Expression Primary Mode of Expression: Verbal Verbal Expression Overall Verbal Expression: Impaired Initiation: No impairment Level of Generative/Spontaneous Verbalization: Conversation Repetition: No impairment Naming: No impairment Pragmatics: No impairment Interfering Components: Speech intelligibility Written Expression Dominant Hand: Right Written Expression: Not tested   Oral / Motor  Oral Motor/Sensory Function Overall Oral Motor/Sensory Function: Mild impairment Facial ROM: Reduced right Facial Symmetry: Abnormal symmetry right Facial Strength: Within Functional Limits Lingual ROM: Within Functional Limits Lingual Symmetry: Within Functional Limits Lingual Strength: Within Functional Limits Velum: Within Functional Limits Motor Speech Overall Motor Speech: Impaired Respiration: Within functional limits Phonation: Low vocal intensity Resonance: Within functional limits Articulation: Impaired Level of Impairment: Conversation Intelligibility: Intelligibility reduced Word: 75-100% accurate Phrase: 75-100% accurate Sentence: 75-100% accurate Conversation: 75-100% accurate Motor Planning: Witnin functional limits Motor Speech Errors: Not applicable   GO                    Macarthur CritchleyMeredith Phares Zaccone , Student-SLP 07/04/2016, 11:33 AM

## 2016-07-05 ENCOUNTER — Inpatient Hospital Stay (HOSPITAL_COMMUNITY)
Admission: RE | Admit: 2016-07-05 | Discharge: 2016-07-16 | DRG: 057 | Disposition: A | Discharge status: Home / Self Care | Payer: 59 | Source: Intra-hospital | Attending: Physical Medicine & Rehabilitation | Admitting: Physical Medicine & Rehabilitation

## 2016-07-05 ENCOUNTER — Encounter (HOSPITAL_COMMUNITY): Payer: Self-pay | Admitting: *Deleted

## 2016-07-05 ENCOUNTER — Inpatient Hospital Stay (HOSPITAL_COMMUNITY): Payer: 59

## 2016-07-05 DIAGNOSIS — D649 Anemia, unspecified: Secondary | ICD-10-CM | POA: Diagnosis not present

## 2016-07-05 DIAGNOSIS — R000 Tachycardia, unspecified: Secondary | ICD-10-CM

## 2016-07-05 DIAGNOSIS — E876 Hypokalemia: Secondary | ICD-10-CM | POA: Diagnosis not present

## 2016-07-05 DIAGNOSIS — M7989 Other specified soft tissue disorders: Secondary | ICD-10-CM | POA: Diagnosis not present

## 2016-07-05 DIAGNOSIS — I69392 Facial weakness following cerebral infarction: Secondary | ICD-10-CM

## 2016-07-05 DIAGNOSIS — R531 Weakness: Secondary | ICD-10-CM | POA: Diagnosis not present

## 2016-07-05 DIAGNOSIS — I69322 Dysarthria following cerebral infarction: Secondary | ICD-10-CM

## 2016-07-05 DIAGNOSIS — I679 Cerebrovascular disease, unspecified: Secondary | ICD-10-CM | POA: Diagnosis not present

## 2016-07-05 DIAGNOSIS — R269 Unspecified abnormalities of gait and mobility: Secondary | ICD-10-CM | POA: Diagnosis not present

## 2016-07-05 DIAGNOSIS — S344XXD Injury of lumbosacral plexus, subsequent encounter: Secondary | ICD-10-CM | POA: Diagnosis not present

## 2016-07-05 DIAGNOSIS — I69351 Hemiplegia and hemiparesis following cerebral infarction affecting right dominant side: Secondary | ICD-10-CM | POA: Diagnosis present

## 2016-07-05 DIAGNOSIS — Z981 Arthrodesis status: Secondary | ICD-10-CM

## 2016-07-05 DIAGNOSIS — I639 Cerebral infarction, unspecified: Secondary | ICD-10-CM | POA: Diagnosis not present

## 2016-07-05 DIAGNOSIS — Z79899 Other long term (current) drug therapy: Secondary | ICD-10-CM | POA: Diagnosis not present

## 2016-07-05 DIAGNOSIS — R29810 Facial weakness: Secondary | ICD-10-CM | POA: Diagnosis not present

## 2016-07-05 DIAGNOSIS — D62 Acute posthemorrhagic anemia: Secondary | ICD-10-CM

## 2016-07-05 DIAGNOSIS — K5900 Constipation, unspecified: Secondary | ICD-10-CM

## 2016-07-05 DIAGNOSIS — M7981 Nontraumatic hematoma of soft tissue: Secondary | ICD-10-CM | POA: Diagnosis not present

## 2016-07-05 DIAGNOSIS — S301XXS Contusion of abdominal wall, sequela: Secondary | ICD-10-CM | POA: Diagnosis not present

## 2016-07-05 LAB — CREATININE, SERUM: Creatinine, Ser: 1.16 mg/dL (ref 0.61–1.24)

## 2016-07-05 LAB — CBC
HCT: 31 % — ABNORMAL LOW (ref 39.0–52.0)
HEMATOCRIT: 33.3 % — AB (ref 39.0–52.0)
HEMOGLOBIN: 10.5 g/dL — AB (ref 13.0–17.0)
Hemoglobin: 9.9 g/dL — ABNORMAL LOW (ref 13.0–17.0)
MCH: 27.8 pg (ref 26.0–34.0)
MCH: 27.4 pg (ref 26.0–34.0)
MCHC: 31.9 g/dL (ref 30.0–36.0)
MCHC: 31.5 g/dL (ref 30.0–36.0)
MCV: 87.1 fL (ref 78.0–100.0)
MCV: 86.9 fL (ref 78.0–100.0)
PLATELETS: 343 10*3/uL (ref 150–400)
Platelets: 362 10*3/uL (ref 150–400)
RBC: 3.56 MIL/uL — AB (ref 4.22–5.81)
RBC: 3.83 MIL/uL — AB (ref 4.22–5.81)
RDW: 14.5 % (ref 11.5–15.5)
RDW: 14.5 % (ref 11.5–15.5)
WBC: 9.9 10*3/uL (ref 4.0–10.5)
WBC: 11.4 10*3/uL — ABNORMAL HIGH (ref 4.0–10.5)

## 2016-07-05 MED ORDER — METHOCARBAMOL 500 MG PO TABS: 500.0000 mg | ORAL_TABLET | Freq: Three times a day (TID) | ORAL | Status: DC | PRN

## 2016-07-05 MED ORDER — SENNOSIDES-DOCUSATE SODIUM 8.6-50 MG PO TABS: 1.0000 | ORAL_TABLET | Freq: Two times a day (BID) | ORAL | Status: DC

## 2016-07-05 MED ORDER — ONDANSETRON HCL 4 MG PO TABS: 4.0000 mg | ORAL_TABLET | Freq: Four times a day (QID) | ORAL | Status: DC | PRN

## 2016-07-05 MED ORDER — OXYCODONE-ACETAMINOPHEN 5-325 MG PO TABS
3.0000 | ORAL_TABLET | Freq: Four times a day (QID) | ORAL | Status: DC | PRN
  Filled 2016-07-05: qty 3

## 2016-07-05 MED ORDER — ENOXAPARIN SODIUM 40 MG/0.4ML ~~LOC~~ SOLN: 40.0000 mg | SUBCUTANEOUS | Status: DC

## 2016-07-05 MED ORDER — ONDANSETRON HCL 4 MG/2ML IJ SOLN: 4.0000 mg | Freq: Four times a day (QID) | INTRAMUSCULAR | Status: DC | PRN

## 2016-07-05 MED ORDER — SORBITOL 70 % SOLN: 30.0000 mL | Freq: Every day | Status: DC | PRN

## 2016-07-05 MED ORDER — LORAZEPAM 0.5 MG PO TABS
1.0000 mg | ORAL_TABLET | Freq: Every day | ORAL | Status: DC
  Administered 2016-07-05 – 2016-07-15 (×11): 1 mg via ORAL
  Filled 2016-07-05 (×11): qty 2

## 2016-07-05 MED ORDER — ACETAMINOPHEN 325 MG PO TABS
650.0000 mg | ORAL_TABLET | ORAL | Status: DC | PRN
  Administered 2016-07-15: 650 mg via ORAL
  Filled 2016-07-05 (×2): qty 2

## 2016-07-05 MED ORDER — ACETAMINOPHEN 650 MG RE SUPP: 650.0000 mg | RECTAL | Status: DC | PRN

## 2016-07-05 MED ORDER — ACETAMINOPHEN 160 MG/5ML PO SOLN: 650.0000 mg | ORAL | Status: DC | PRN

## 2016-07-05 MED ORDER — ENOXAPARIN SODIUM 40 MG/0.4ML ~~LOC~~ SOLN
40.0000 mg | SUBCUTANEOUS | Status: DC
  Administered 2016-07-05 – 2016-07-07 (×3): 40 mg via SUBCUTANEOUS
  Filled 2016-07-05 (×3): qty 0.4

## 2016-07-05 MED ORDER — SENNOSIDES-DOCUSATE SODIUM 8.6-50 MG PO TABS
1.0000 | ORAL_TABLET | Freq: Two times a day (BID) | ORAL | Status: DC
  Administered 2016-07-05 – 2016-07-06 (×2): 1 via ORAL
  Filled 2016-07-05 (×4): qty 1

## 2016-07-05 NOTE — Discharge Summary (Signed)
Physician Discharge Summary  Caydon Feasel Lama ZOX:[id number] DOB: 1960-08-29 DOA: 07/03/2016  PCP: Katy Apo, MD  Admit date: 07/03/2016 Discharge date: 07/05/2016  Admitted From: Home Disposition: CIR  Recommendations for Outpatient Follow-up:  1. Follow up with PCP in 1 week 2. Start aspirin in 1 week secondary to current hematomas 3. Repeat CBC in 1 week 4. Lung lesions seen incidentally on CT scan. Recommendation for repeat non-contrast CT scan in 3-6 months  Discharge Condition: Stable CODE STATUS: Full code Diet recommendation: Heart healthy   Brief/Interim Summary:  Admission HPI written by Toya Smothers, NP   Chief Complaint: stroke like symptoms/facial droop/right sided weakness  HPI: BABY STAIRS is a very pleasant 56 y.o. male with medical history significant for recent L5-S1 fusion anterior approach, DDD presents to the emergency department with the chief complaint of facial droop slurred speech and right-sided weakness. Initial evaluation reveals strokelike symptoms and anemia likely related to acute blood loss.  Information is obtained from the patient and the wife is at the bedside. Approximately 1 week ago he had back surgery was doing well with mobilization and pain management. He awakened around 1:30 this morning and go to the bathroom and he fell. He noted inability to bear weight on the right leg. In addition it was noted he had a right facial droop. Wife thought it was mostly related to pain medicines. Patient went back to bed. Wife got up in the morning and went to work. She states patient called her at work around noon and  told her he had fallen again. She had home she noted a facial droop and his worsening slurred speech. Patient denies headache visual disturbances numbness or tingling of his extremities. He denies chest pain palpitation shortness of breath. He denies nausea vomiting but does endorse some abdominal pain at his incision site. He denies dysuria  hematuria frequency or urgency. He does report last bowel movement was yesterday but he has not been very regular with bowel movements. 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 Aspirin held secondary to hematomas. Statin not prescribed per neurology. LDL of 39. Physical therapy recommended CIR.   Anemia S/p 1u PRBC. Hemoglobin stable. Secondary to two abdominal hematomas.  Hematomas Located below rectus sheath and left psoas muscles. Secondary to recent surgery. Vascular surgery evaluated patient. DVT ultrasound performed which was negative. Lovenox discontinued and aspirin held for 1 week. He received 1 unit of PRBC and has remained stable.  Tachycardia At baseline. Currently stable. Sinus tachycardia. Asymptomatic.  L5-S1 fusion Patient was discharged on 3/6. Outpatient management per orthopedic surgery  Lung lesions Seen on CT abdomen. Recommendation for follow-up at 3-6 months with non-contrast chest CT  Heart failure with reduce ejection fraction Stable. Compensated. Recommend outpatient cardiology follow-up.  Discharge Diagnoses:  Principal Problem:   Stroke [facility]) Active Problems:   Back pain   Anemia   Tachycardia   Right sided weakness   Facial droop    Discharge Instructions   Allergies as of 07/05/2016   No Known Allergies     Medication List    STOP taking these medications   enoxaparin 40 MG/0.4ML  injection Commonly known as:  LOVENOX     TAKE these medications   acetaminophen 325 MG tablet Commonly known as:  TYLENOL Take 650 mg by mouth every 6 (six) hours as needed (for pain.).    methocarbamol 500 MG tablet Commonly known as:  ROBAXIN Take 1 tablet (500 mg total) by mouth 3 (three) times daily as needed for muscle spasms.   ondansetron 4 MG tablet Commonly known as:  ZOFRAN Take 1 tablet (4 mg total) by mouth every 8 (eight) hours as needed for nausea or vomiting.   oxyCODONE-acetaminophen 10-325 MG tablet Commonly known as:  PERCOCET Take 1 tablet by mouth every 4 (four) hours as needed for pain.   senna-docusate 8.6-50 MG tablet Commonly known as:  Senokot-S Take 1 tablet by mouth 2 (two) times daily.   testosterone cypionate 200 MG/ML injection Commonly known as:  DEPOTESTOSTERONE CYPIONATE Inject 300 mg into the muscle every 14 (fourteen) days. Gets 1.5 ml at Dr office every 2 weeks Next dose is due 06-24-2016      Follow-up Information    POLITE,RONALD D, MD. Schedule an appointment as soon as possible for a visit in 1 week(s).   Specialty:  Internal Medicine Contact information: [address] [place] [address] [phone]        Delia Heady, MD. Schedule an appointment as soon as possible for a visit in 6 week(s).   Specialties:  Neurology, Radiology Contact information: [address] [place] [address] [phone]          No Known Allergies  Consultations:  Neurology  Vascular surgery   Procedures/Studies: Ct Abdomen Pelvis Wo Contrast  Addendum Date: 07/03/2016   ADDENDUM REPORT: 07/03/2016 17:46 ADDENDUM: Nodular densities as stated in the report along the major fissure on the right may represent small intrafissural lymph nodes. There are right lower lobe 6 and 7 mm nodular densities for which follow-up is recommended. Non-contrast chest CT at 3-6 months is recommended. If the nodules are stable at time of repeat CT, then future CT at 18-24 months (from today's scan) is considered optional for low-risk patients, but is recommended for high-risk patients. This recommendation follows the  consensus statement: Guidelines for Management of Incidental Pulmonary Nodules Detected on CT Images: From the Fleischner Society 2017; Radiology 2017; 284:228-243. Electronically Signed   By: Tollie Eth M.D.   On: 07/03/2016 17:46   Result Date: 07/03/2016 CLINICAL DATA:  Anemia, history of recent back surgery 06/27/2016 EXAM: CT ABDOMEN AND PELVIS WITHOUT CONTRAST TECHNIQUE: Multidetector CT imaging of the abdomen and pelvis was performed following the standard protocol without IV contrast. COMPARISON:  06/28/2016 radiographs of the lumbar spine FINDINGS: Lower chest: Under nodular densities along the right major fissure and right lower lobe possibly representing intrafissural lymph nodes. There are 6 and 7 mm nodular densities in the right lower lobe. No pulmonary consolidation. Minimal pleural thickening and/or atelectasis posteriorly at the left lung base. Hepatobiliary: 6 mm hypodensity in the left hepatic lobe may represent a small cyst or hemangioma. Further characterization not possible given lack of IV contrast. Gallbladder is unremarkable. No biliary dilatation. Pancreas: Normal Spleen: Normal Adrenals/Urinary Tract: No nephrolithiasis nor obstructive uropathy. No focal renal mass identified. Contracted bladder. Stomach/Bowel: Stomach is within normal limits. Appendix appears normal. No evidence of bowel wall thickening, distention, or inflammatory changes. Vascular/Lymphatic: Small retroperitoneal periaortic and mesenteric lymph nodes, the largest approximately 10 mm short axis. No aneurysm. Reproductive: Prostate is unremarkable. Other: Hematomas of the  left lower rectus muscle and superficially over the left psoas muscle are identified, the left psoas measuring 4.6 x 9 x 10.2 cm in the left rectus measuring approximately 7 x 3.4 x 9 cm. Overlying subcutaneous emphysema in the left lower quadrant with postsurgical change noted. Tiny foci of air noted adjacent to the left psoas hematoma believed to be  within adjacent bowel or related to postsurgical change. An evolving abscess cannot be entirely excluded. Musculoskeletal: L5-S1 fusion hardware is noted. IMPRESSION: 1. Intramuscular hematoma of the left rectus muscle measuring 7 x 3.4 x 9 cm and overlying the left psoas measuring 10.2 x 9 x 4.6 cm. Adjacent tiny foci of gas are believed to be post pop related or potentially due to adjacent non-opacified small bowel adjacent to the left psoas hematoma. Cannot exclude the possibility of a developing abscess. Clinical correlation and follow-up is suggested. 2. L5-S1 fusion. Electronically Signed: By: Tollie Ethavid  Kwon M.D. On: 07/03/2016 17:36   Dg Chest 2 View  Result Date: 07/03/2016 CLINICAL DATA:  Clinical signs of a CVA today. History of tachycardia. EXAM: CHEST  2 VIEW COMPARISON:  Portable chest x-rays of March 30, 2015 FINDINGS: The lungs are well-expanded and clear. The heart and pulmonary vascularity are normal. The mediastinum is normal in width. There is no pleural effusion. The bony thorax is unremarkable. IMPRESSION: There is no active cardiopulmonary disease. Electronically Signed   By: David  SwazilandJordan M.D.   On: 07/03/2016 15:08   Dg Lumbar Spine 2-3 Views  Result Date: 06/28/2016 CLINICAL DATA:  Status post lumbar fusion EXAM: LUMBAR SPINE - 2-3 VIEW COMPARISON:  06/27/2016 FINDINGS: Anterior fusion at L5-S1 is noted stable from the prior postoperative study. No compression deformity is seen. No soft tissue abnormality is noted. IMPRESSION: Stable L5-S1 fusion. Electronically Signed   By: Alcide CleverMark  Lukens M.D.   On: 06/28/2016 09:52   Dg Lumbar Spine 2-3 Views  Result Date: 06/27/2016 CLINICAL DATA:  ALIF EXAM: DG C-ARM 61-120 MIN; LUMBAR SPINE - 2-3 VIEW COMPARISON:  Lumbar MRI 10/13/2015 FINDINGS: AP and lateral C-arm images were obtained in the operating room. Images showed satisfactory placement of interbody spacer at L5-S1. Final image shows interbody spacer and anterior plate and screws in  satisfactory position L5-S1. IMPRESSION: L5-S1 ALIF Electronically Signed   By: Marlan Palauharles  Clark M.D.   On: 06/27/2016 11:15   Ct Head Wo Contrast  Result Date: 07/03/2016 CLINICAL DATA:  Altered mental status today.  Right facial droop. EXAM: CT HEAD WITHOUT CONTRAST TECHNIQUE: Contiguous axial images were obtained from the base of the skull through the vertex without intravenous contrast. COMPARISON:  Head CT scan 01/03/2010. FINDINGS: Brain: Appears normal without hemorrhage, infarct, mass lesion, mass effect, midline shift or abnormal extra-axial fluid collection. No hydrocephalus or pneumocephalus. Vascular: Negative. Skull: Intact. Sinuses/Orbits: Small mucous retention cyst or polyp right maxillary sinus is unchanged. Other: None. IMPRESSION: Negative head CT. Unchanged small mucous retention cyst or polyp right maxillary sinus is noted. Electronically Signed   By: Drusilla Kannerhomas  Dalessio M.D.   On: 07/03/2016 13:38   Mr Brain Wo Contrast  Result Date: 07/03/2016 CLINICAL DATA:  56 y/o M; right leg weakness, facial droop, and slurred speech. EXAM: MRI HEAD WITHOUT CONTRAST MRA HEAD WITHOUT CONTRAST TECHNIQUE: Multiplanar, multiecho pulse sequences of the brain and surrounding structures were obtained without intravenous contrast. Angiographic images of the head were obtained using MRA technique without contrast. COMPARISON:  None. 07/03/2016 CT of the head. FINDINGS: MRI HEAD FINDINGS Brain: 10 x 6  mm focus of low diffusivity within the left posterior limb of internal capsule (series 4, image 22). 3 mm focus of low diffusivity within the right temporal operculum (Series 4, image 19). No additional diffusion signal abnormality. T2 hyperintense focus in the right hemi pons. No focal mass effect, intracranial hemorrhage, hydrocephalus, or extra-axial collection. Vascular: As below. Skull and upper cervical spine: Normal marrow signal. Sinuses/Orbits: Mild anterior ethmoid sinus mucosal thickening and small mucous  retention cyst in the right maxillary sinus. No abnormal signal of mastoid air cells. Orbits are unremarkable. Other: Right superolateral low signal dermal lesion overlying parietal bone measuring up to 10 mm (series 10, image 12). Direct visualization recommended. MRA HEAD FINDINGS Internal carotid arteries:  Patent. Anterior cerebral arteries:  Patent. Middle cerebral arteries: Patent. Anterior communicating artery: Patent. Posterior communicating arteries: Small patent left. No right identified, likely hypoplastic or absent. Posterior cerebral arteries:  Patent. Basilar artery:  Patent. Vertebral arteries:  Patent. No evidence of high-grade stenosis, large vessel occlusion, or aneurysm. IMPRESSION: 1. 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 No associated hemorrhage. Different vascular territories suggests embolic etiology. 2. T2 hyperintense focus in right hemi pons, possibly chronic lacunar infarct or prominent perivascular space. 3. Mild paranasal sinus disease. 4. Dermal lesion in right superolateral scalp overlying parietal bone, direct visualization recommended. 5. Normal MRA of the head without contrast. These results will be called to the ordering clinician or representative by the Radiologist Assistant, and communication documented in the PACS or zVision Dashboard. Electronically Signed   By: Mitzi Hansen M.D.   On: 07/03/2016 16:40   Dg C-arm 61-120 Min  Result Date: 06/27/2016 CLINICAL DATA:  ALIF EXAM: DG C-ARM 61-120 MIN; LUMBAR SPINE - 2-3 VIEW COMPARISON:  Lumbar MRI 10/13/2015 FINDINGS: AP and lateral C-arm images were obtained in the operating room. Images showed satisfactory placement of interbody spacer at L5-S1. Final image shows interbody spacer and anterior plate and screws in satisfactory position L5-S1. IMPRESSION: L5-S1 ALIF Electronically Signed   By: Marlan Palau M.D.   On:  06/27/2016 11:15   Mr Maxine Glenn Head/brain ZO Cm  Result Date: 07/03/2016 CLINICAL DATA:  56 y/o M; right leg weakness, facial droop, and slurred speech. EXAM: MRI HEAD WITHOUT CONTRAST MRA HEAD WITHOUT CONTRAST TECHNIQUE: Multiplanar, multiecho pulse sequences of the brain and surrounding structures were obtained without intravenous contrast. Angiographic images of the head were obtained using MRA technique without contrast. COMPARISON:  None. 07/03/2016 CT of the head. FINDINGS: MRI HEAD FINDINGS Brain: 10 x 6 mm focus of low diffusivity within the left posterior limb of internal capsule (series 4, image 22). 3 mm focus of low diffusivity within the right temporal operculum (Series 4, image 19). No additional diffusion signal abnormality. T2 hyperintense focus in the right hemi pons. No focal mass effect, intracranial hemorrhage, hydrocephalus, or extra-axial collection. Vascular: As below. Skull and upper cervical spine: Normal marrow signal. Sinuses/Orbits: Mild anterior ethmoid sinus mucosal thickening and small mucous retention cyst in the right maxillary sinus. No abnormal signal of mastoid air cells. Orbits are unremarkable. Other: Right superolateral low signal dermal lesion overlying parietal bone measuring up to 10 mm (series 10, image 12). Direct visualization recommended. MRA HEAD FINDINGS Internal carotid arteries:  Patent. Anterior cerebral arteries:  Patent. Middle cerebral arteries: Patent. Anterior communicating artery: Patent. Posterior communicating arteries: Small patent left. No right identified, likely hypoplastic or absent. Posterior cerebral arteries:  Patent. Basilar artery:  Patent. Vertebral arteries:  Patent. No evidence of high-grade stenosis, large vessel occlusion, or aneurysm. IMPRESSION: 1. 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 No associated hemorrhage. Different vascular territories suggests  embolic etiology. 2. T2 hyperintense focus in right hemi pons, possibly chronic lacunar infarct or prominent perivascular space. 3. Mild paranasal sinus disease. 4. Dermal lesion in right superolateral scalp overlying parietal bone, direct visualization recommended. 5. Normal MRA of the head without contrast. These results will be called to the ordering clinician or representative by the Radiologist Assistant, and communication documented in the PACS or zVision Dashboard. Electronically Signed   By: Mitzi Hansen M.D.   On: 07/03/2016 16:40   Dg Or Local Abdomen  Addendum Date: 06/27/2016   ADDENDUM REPORT: 06/27/2016 11:15 ADDENDUM: These results were called by telephone at the time of interpretation on 06/27/2016 at 11:14 am to Dr. Venita Lick in the OR, who verbally acknowledged these results. No unexpected radiopaque foreign bodies in the visualized abdomen or pelvis. Original report is otherwise unchanged. Electronically Signed   By: Delbert Phenix M.D.   On: 06/27/2016 11:15   Result Date: 06/27/2016 CLINICAL DATA:  Low back pain with leg pain.  ALIF. EXAM: OR LOCAL ABDOMEN COMPARISON:  10/13/2015 MRI lumbar spine. Intraoperative fluoroscopic radiographs from earlier today. FINDINGS: Portable intraoperative supine radiographs of the abdomen and pelvis demonstrate expected postsurgical changes from anterior spinal fusion at L5-S1 with bone cage overlying the L5-S1 disc space and no evidence of hardware fracture or loosening on these views. Surgical clips overlie the left deep pelvis. Nonobstructive bowel gas pattern. No evidence of pneumatosis or pneumoperitoneum. Healed deformities in the bilateral lower pubic rami. Clear lung bases. Esophageal temperature probe overlies the lower thoracic esophagus. IMPRESSION: Expected postsurgical changes from anterior spinal fusion at L5-S1. Electronically Signed: By: Delbert Phenix M.D. On: 06/27/2016 11:03    Echocardiogram (07/04/2016)  Study  Conclusions  - Left ventricle: The cavity size was normal. Systolic function was   mildly reduced. The estimated ejection fraction was in the range   of 45% to 50%. Wall motion was normal; there were no regional   wall motion abnormalities.  Impressions:  - No cardiac source of emboli was indentified.   Subjective: No abdominal pain. No chest pain or dyspnea.  Discharge Exam: Vitals:   07/05/16 0446 07/05/16 1410  BP: 130/70 139/82  Pulse: (!) 108 84  Resp: 18 18  Temp: 99.1 F (37.3 C) 98.4 F (36.9 C)   Vitals:   07/05/16 0100 07/05/16 0446 07/05/16 0919 07/05/16 1410  BP: 134/74 130/70  139/82  Pulse: (!) 114 (!) 108  84  Resp: 18 18  18   Temp: 99.5 F (37.5 C) 99.1 F (37.3 C)  98.4 F (36.9 C)  TempSrc: Oral Oral Other (Comment) Oral  SpO2: 99% 98%  98%  Weight:      Height:        General exam: Appears calm and comfortable Respiratory system: Clear to auscultation. Respiratory effort normal. Cardiovascular system: S1 & S2 heard, RRR. No murmurs, rubs, gallops or clicks. Gastrointestinal system: Abdomen is nondistended, soft and tender over left rectus muscles with mass felt in that area. Normal bowel sounds heard. Central nervous system: Alert and oriented x2. Right facial drop with flattening of nasolabial fold Extremities: No edema. No calf tenderness Skin: No cyanosis. No rashes Psychiatry: Judgement and insight appear normal. Mood & affect appropriate.   The  results of significant diagnostics from this hospitalization (including imaging, microbiology, ancillary and laboratory) are listed below for reference.     Microbiology: No results found for this or any previous visit (from the past 240 hour(s)).   Labs: BNP (last 3 results) No results for input(s): BNP in the last 8760 hours. Basic Metabolic Panel:  Recent Labs Lab 07/03/16 1224  NA 138  K 3.6  CL 98*  CO2 30  GLUCOSE 143*  BUN 9  CREATININE 1.22  CALCIUM 8.9   Liver Function  Tests:  Recent Labs Lab 07/03/16 1224  AST 43*  ALT 26  ALKPHOS 48  BILITOT 1.3*  PROT 6.8  ALBUMIN 3.5   No results for input(s): LIPASE, AMYLASE in the last 168 hours. No results for input(s): AMMONIA in the last 168 hours. CBC:  Recent Labs Lab 07/03/16 1224 07/04/16 0345 07/05/16 0414  WBC 8.7 10.1 9.9  NEUTROABS 7.0  --   --   HGB 8.3* 9.5* 9.9*  HCT 26.3* 30.0* 31.0*  MCV 88.0 88.0 87.1  PLT 295 278 343   Cardiac Enzymes: No results for input(s): CKTOTAL, CKMB, CKMBINDEX, TROPONINI in the last 168 hours. BNP: Invalid input(s): POCBNP CBG: No results for input(s): GLUCAP in the last 168 hours. D-Dimer No results for input(s): DDIMER in the last 72 hours. Hgb A1c  Recent Labs  07/03/16 1224  HGBA1C 6.5*   Lipid Profile  Recent Labs  07/03/16 1224  CHOL 106  HDL 45  LDLCALC 39  TRIG 111  CHOLHDL 2.4   Thyroid function studies No results for input(s): TSH, T4TOTAL, T3FREE, THYROIDAB in the last 72 hours.  Invalid input(s): FREET3 Anemia work up  Recent Labs  07/03/16 1224 07/03/16 1839 07/04/16 1249  VITAMINB12  --   --  547  FOLATE  --  20.0  --   FERRITIN  --   --  175  TIBC  --   --  294  IRON  --   --  31*  RETICCTPCT 4.8*  --   --    Urinalysis    Component Value Date/Time   COLORURINE YELLOW 07/03/2016 1542   APPEARANCEUR CLEAR 07/03/2016 1542   LABSPEC 1.024 07/03/2016 1542   PHURINE 7.0 07/03/2016 1542   GLUCOSEU 50 (A) 07/03/2016 1542   HGBUR NEGATIVE 07/03/2016 1542   BILIRUBINUR NEGATIVE 07/03/2016 1542   KETONESUR 20 (A) 07/03/2016 1542   PROTEINUR 30 (A) 07/03/2016 1542   NITRITE NEGATIVE 07/03/2016 1542   LEUKOCYTESUR NEGATIVE 07/03/2016 1542   Sepsis Labs Invalid input(s): PROCALCITONIN,  WBC,  LACTICIDVEN Microbiology No results found for this or any previous visit (from the past 240 hour(s)).   Time coordinating discharge: Over 30 minutes  SIGNED:   Jacquelin Hawking, MD [facility] 07/05/2016, 2:58  PM Pager [phone]  If 7PM-7AM, please contact night-coverage [email or web address] Password [email or web address]

## 2016-07-05 NOTE — Progress Notes (Signed)
[facility]

## 2016-07-05 NOTE — Progress Notes (Signed)
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 right handed male with history of recent L5-S1 fusion anterior approach 06/28/2016. Per chart review patient lives with spouse. 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 One level home with 1 step to entry. Presented 07/03/2016 with right-sided weakness and facial droop with slurred speech. Cranial CT scan negative for acute changes. 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 No associated hemorrhage. MRA negative. Patient did not receive TPA. Echocardiogram with ejection fraction of 50% no wall motion abnormalities. No cardiac source of emboli identified. Carotid Dopplers with no ICA stenosis. Findings of significant decrease in hemoglobin from 15.2-8.3 since recent back surgery. CT abdomen and pelvis showed an intramuscular hematoma of the left rectus muscle measuring 7 x 3.4 x 9 cm in overlying the left psoas 10.2 x 9 x 4.6 cm. Neurology consulted presently on no anticoagulation due to rectus psoas hematoma. He was placed on subcutaneous Lovenox for DVT prophylaxis 07/03/2016. Patient continues with a back brace lumbar corset after recent back surgery applied in sitting position. Physical therapy evaluation completed 07/04/2016 with recommendations of physical medicine rehabilitation consult.   Review of Systems    Constitutional: Negative for chills and fever.  HENT: Negative for hearing loss.   Eyes: Negative for blurred vision and double vision.  Respiratory: Negative for cough and shortness of breath.   Cardiovascular: Negative for palpitations and leg swelling.       Nonspecific chest pain  Gastrointestinal: Positive for constipation. Negative for nausea and vomiting.  Genitourinary: Positive for urgency. Negative for dysuria and hematuria.  Musculoskeletal: Positive for back pain and myalgias.  Skin: Negative for rash.  Neurological: Positive for speech change and weakness. Negative for seizures.  All other systems reviewed and are negative.      Past Medical History:  Diagnosis Date  . Anemia   . Arthritis   . Tachycardia         Past Surgical History:  Procedure Laterality Date  . ABDOMINAL EXPOSURE N/A 06/27/2016   Procedure: ABDOMINAL EXPOSURE;  Surgeon: Chuck Hint, MD;  Location: [facility] OR;  Service: Vascular;  Laterality: N/A;  . ANTERIOR LUMBAR FUSION N/A 06/27/2016   Procedure: ANTERIOR LUMBAR FUSION L5-S1;  Surgeon: Venita Lick, MD;  Location: [facility] OR;  Service: Orthopedics;  Laterality: N/A;  Requests 3 hours  . FINGER AMPUTATION Left   . SHOULDER SURGERY     History reviewed. No pertinent family history. Social History:  reports that he has never smoked. He has never used smokeless tobacco. He reports that he drinks alcohol. He reports that he does not use drugs. Allergies: No Known Allergies       Medications Prior to Admission  Medication Sig Dispense Refill  . acetaminophen (TYLENOL) 325 MG tablet Take 650 mg by mouth every 6 (six) hours as needed (for pain.).    Marland Kitchen enoxaparin (LOVENOX) 40 MG/0.4ML injection Inject 0.4  mLs (40 mg total) into the skin daily. 10 day supply 1 injection per day 10 Syringe 0  . methocarbamol (ROBAXIN) 500 MG tablet Take 1 tablet (500 mg total) by mouth 3 (three) times daily as needed for muscle spasms. 21 tablet 0  .  ondansetron (ZOFRAN) 4 MG tablet Take 1 tablet (4 mg total) by mouth every 8 (eight) hours as needed for nausea or vomiting. 20 tablet 0  . oxyCODONE-acetaminophen (PERCOCET) 10-325 MG tablet Take 1 tablet by mouth every 4 (four) hours as needed for pain. 42 tablet 0  . testosterone cypionate (DEPOTESTOSTERONE CYPIONATE) 200 MG/ML injection Inject 300 mg into the muscle every 14 (fourteen) days. 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 Drove. works out regularly.   Lives With: Spouse  Functional History: Prior Function Level of Independence: Independent Functional Status:  Mobility: Bed Mobility Overal bed mobility: Needs Assistance General bed mobility comments: up in chair Transfers Overall transfer level: Needs assistance Equipment used: Rolling walker (2 wheeled) Transfers: Sit to/from Stand Sit to Stand: Mod assist, +2 safety/equipment General transfer comment: lifting assist out of chair due to R foot positioned to L of midline and forward. Family also assisting Ambulation/Gait Ambulation/Gait assistance: Mod assist Ambulation Distance (Feet): 80 Feet Assistive device: Rolling walker (2 wheeled) Gait Pattern/deviations: Step-to pattern, Shuffle, Trunk flexed, Decreased stride length, Decreased dorsiflexion - right, Decreased stance time - right, Ataxic, Wide base of support General Gait Details: Dragging R  foot on floor during swing, positions somtimes too far medially, noted with gait out of room L LE ataxic during swing with tremors, but intact placement  ADL: ADL Overall ADL's : Needs assistance/impaired Grooming: Minimal assistance Upper Body Bathing: Minimal assistance, Sitting Lower Body Bathing: Minimal assistance, Sit to/from stand Upper Body Dressing : Moderate assistance Lower Body Dressing: Moderate assistance, Sit to/from stand Toilet Transfer: Moderate assistance Toileting- Clothing Manipulation and Hygiene: Minimal assistance Functional mobility during ADLs: Moderate assistance General ADL Comments: Pt with R lateral bias while sitting EOB. Pt able to cross R leg over L knee with difficulty but unable to maintain that position.   Cognition: Cognition Overall Cognitive Status: Impaired/Different from baseline Arousal/Alertness: Suspect due to medications (drowsy from Ativan; per fiance) Orientation Level: Oriented X4 Attention: Sustained Sustained Attention: Impaired Sustained Attention Impairment: Verbal basic Memory: Impaired Memory Impairment: Storage deficit, Retrieval deficit, Decreased short term memory Decreased Short Term Memory: Verbal basic Awareness: Appears intact Problem Solving:  (not tested, suspect higher level impaired) Safety/Judgment: Appears intact Cognition Arousal/Alertness: Awake/alert Behavior During Therapy: WFL for tasks assessed/performed Overall Cognitive Status: Impaired/Different from baseline Area of Impairment: Attention, Awareness, Problem solving Current Attention Level: Selective Awareness: Emergent Problem Solving: Slow processing, Decreased initiation, Difficulty sequencing General Comments: will further assess. Pt had 2 falls at home PTA and leaning to R on therapist adn thinks he will be OK to DC home  Blood pressure 130/70, pulse (!) 108, temperature 99.1 F (37.3 C), temperature source Oral, resp. rate 18, height 5' 9.5"  (1.765 m), weight 88.9 kg (  196 lb), SpO2 98 %. Physical Exam  Constitutional: He appears well-developed.  HENT:  Mild facial droop  Eyes: EOM are normal.  Neck: Normal range of motion. Neck supple. No thyromegaly present.  Cardiovascular: Normal rate, regular rhythm and normal heart sounds.   Respiratory: Effort normal and breath sounds normal. No respiratory distress.  GI: Soft. Bowel sounds are normal. He exhibits no distension.  Musculoskeletal: He exhibits tenderness (low back/abdominal incision).  Neurological: He is alert.  Makes good eye contact with examiner. Speech is mildly dysarthric. Mild right central 7. Follows full commands. Fair awareness of deficits. RUE 3- deltoid, 3 to 3+ biceps, triceps, wrist, hand. LUE: 5/5. LLE: 4 to 4+/5 prox to distal. RLE: 4- HF,KE and 4/5 ADF/PF. Denies sensory loss. Senses pain in all 4's. DTR's 3+ bilaterally in LE's  Skin: Skin is warm and dry.  Abdominal incision intact/ tender    Lab Results Last 24 Hours       Results for orders placed or performed during the hospital encounter of 07/03/16 (from the past 24 hour(s))  Vitamin B12     Status: None   Collection Time: 07/04/16 12:49 PM  Result Value Ref Range   Vitamin B-12 547 180 - 914 pg/mL  Iron and TIBC     Status: Abnormal   Collection Time: 07/04/16 12:49 PM  Result Value Ref Range   Iron 31 (L) 45 - 182 ug/dL   TIBC 161294 096250 - 045450 ug/dL   Saturation Ratios 11 (L) 17.9 - 39.5 %   UIBC 263 ug/dL  Ferritin     Status: None   Collection Time: 07/04/16 12:49 PM  Result Value Ref Range   Ferritin 175 24 - 336 ng/mL  CBC     Status: Abnormal   Collection Time: 07/05/16  4:14 AM  Result Value Ref Range   WBC 9.9 4.0 - 10.5 K/uL   RBC 3.56 (L) 4.22 - 5.81 MIL/uL   Hemoglobin 9.9 (L) 13.0 - 17.0 g/dL   HCT 40.931.0 (L) 81.139.0 - 91.452.0 %   MCV 87.1 78.0 - 100.0 fL   MCH 27.8 26.0 - 34.0 pg   MCHC 31.9 30.0 - 36.0 g/dL   RDW 78.214.5 95.611.5 - 21.315.5 %   Platelets 343 150 -  400 K/uL      Imaging Results (Last 48 hours)  Ct Abdomen Pelvis Wo Contrast  Addendum Date: 07/03/2016   ADDENDUM REPORT: 07/03/2016 17:46 ADDENDUM: Nodular densities as stated in the report along the major fissure on the right may represent small intrafissural lymph nodes. There are right lower lobe 6 and 7 mm nodular densities for which follow-up is recommended. Non-contrast chest CT at 3-6 months is recommended. If the nodules are stable at time of repeat CT, then future CT at 18-24 months (from today's scan) is considered optional for low-risk patients, but is recommended for high-risk patients. This recommendation follows the consensus statement: Guidelines for Management of Incidental Pulmonary Nodules Detected on CT Images: From the Fleischner Society 2017; Radiology 2017; 284:228-243. Electronically Signed   By: Tollie Ethavid  Kwon M.D.   On: 07/03/2016 17:46   Result Date: 07/03/2016 CLINICAL DATA:  Anemia, history of recent back surgery 06/27/2016 EXAM: CT ABDOMEN AND PELVIS WITHOUT CONTRAST TECHNIQUE: Multidetector CT imaging of the abdomen and pelvis was performed following the standard protocol without IV contrast. COMPARISON:  06/28/2016 radiographs of the lumbar spine FINDINGS: Lower chest: Under nodular densities along the right major fissure and right lower lobe possibly representing intrafissural lymph  nodes. There are 6 and 7 mm nodular densities in the right lower lobe. No pulmonary consolidation. Minimal pleural thickening and/or atelectasis posteriorly at the left lung base. Hepatobiliary: 6 mm hypodensity in the left hepatic lobe may represent a small cyst or hemangioma. Further characterization not possible given lack of IV contrast. Gallbladder is unremarkable. No biliary dilatation. Pancreas: Normal Spleen: Normal Adrenals/Urinary Tract: No nephrolithiasis nor obstructive uropathy. No focal renal mass identified. Contracted bladder. Stomach/Bowel: Stomach is within normal limits.  Appendix appears normal. No evidence of bowel wall thickening, distention, or inflammatory changes. Vascular/Lymphatic: Small retroperitoneal periaortic and mesenteric lymph nodes, the largest approximately 10 mm short axis. No aneurysm. Reproductive: Prostate is unremarkable. Other: Hematomas of the left lower rectus muscle and superficially over the left psoas muscle are identified, the left psoas measuring 4.6 x 9 x 10.2 cm in the left rectus measuring approximately 7 x 3.4 x 9 cm. Overlying subcutaneous emphysema in the left lower quadrant with postsurgical change noted. Tiny foci of air noted adjacent to the left psoas hematoma believed to be within adjacent bowel or related to postsurgical change. An evolving abscess cannot be entirely excluded. Musculoskeletal: L5-S1 fusion hardware is noted. IMPRESSION: 1. Intramuscular hematoma of the left rectus muscle measuring 7 x 3.4 x 9 cm and overlying the left psoas measuring 10.2 x 9 x 4.6 cm. Adjacent tiny foci of gas are believed to be post pop related or potentially due to adjacent non-opacified small bowel adjacent to the left psoas hematoma. Cannot exclude the possibility of a developing abscess. Clinical correlation and follow-up is suggested. 2. L5-S1 fusion. Electronically Signed: By: Tollie Eth M.D. On: 07/03/2016 17:36   Dg Chest 2 View  Result Date: 07/03/2016 CLINICAL DATA:  Clinical signs of a CVA today. History of tachycardia. EXAM: CHEST  2 VIEW COMPARISON:  Portable chest x-rays of March 30, 2015 FINDINGS: The lungs are well-expanded and clear. The heart and pulmonary vascularity are normal. The mediastinum is normal in width. There is no pleural effusion. The bony thorax is unremarkable. IMPRESSION: There is no active cardiopulmonary disease. Electronically Signed   By: David  [place] M.D.   On: 07/03/2016 15:08   Ct Head Wo Contrast  Result Date: 07/03/2016 CLINICAL DATA:  Altered mental status today.  Right facial droop. EXAM: CT HEAD  WITHOUT CONTRAST TECHNIQUE: Contiguous axial images were obtained from the base of the skull through the vertex without intravenous contrast. COMPARISON:  Head CT scan 01/03/2010. FINDINGS: Brain: Appears normal without hemorrhage, infarct, mass lesion, mass effect, midline shift or abnormal extra-axial fluid collection. No hydrocephalus or pneumocephalus. Vascular: Negative. Skull: Intact. Sinuses/Orbits: Small mucous retention cyst or polyp right maxillary sinus is unchanged. Other: None. IMPRESSION: Negative head CT. Unchanged small mucous retention cyst or polyp right maxillary sinus is noted. Electronically Signed   By: Drusilla Kanner M.D.   On: 07/03/2016 13:38   Mr Brain Wo Contrast  Result Date: 07/03/2016 CLINICAL DATA:  56 y/o M; right leg weakness, facial droop, and slurred speech. EXAM: MRI HEAD WITHOUT CONTRAST MRA HEAD WITHOUT CONTRAST TECHNIQUE: Multiplanar, multiecho pulse sequences of the brain and surrounding structures were obtained without intravenous contrast. Angiographic images of the head were obtained using MRA technique without contrast. COMPARISON:  None. 07/03/2016 CT of the head. FINDINGS: MRI HEAD FINDINGS Brain: 10 x 6 mm focus of low diffusivity within the left posterior limb of internal capsule (series 4, image 22). 3 mm focus of low diffusivity within the right temporal operculum (Series 4,  image 19). No additional diffusion signal abnormality. T2 hyperintense focus in the right hemi pons. No focal mass effect, intracranial hemorrhage, hydrocephalus, or extra-axial collection. Vascular: As below. Skull and upper cervical spine: Normal marrow signal. Sinuses/Orbits: Mild anterior ethmoid sinus mucosal thickening and small mucous retention cyst in the right maxillary sinus. No abnormal signal of mastoid air cells. Orbits are unremarkable. Other: Right superolateral low signal dermal lesion overlying parietal bone measuring up to 10 mm (series 10, image 12). Direct visualization  recommended. MRA HEAD FINDINGS Internal carotid arteries:  Patent. Anterior cerebral arteries:  Patent. Middle cerebral arteries: Patent. Anterior communicating artery: Patent. Posterior communicating arteries: Small patent left. No right identified, likely hypoplastic or absent. Posterior cerebral arteries:  Patent. Basilar artery:  Patent. Vertebral arteries:  Patent. No evidence of high-grade stenosis, large vessel occlusion, or aneurysm. IMPRESSION: 1. 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 No associated hemorrhage. Different vascular territories suggests embolic etiology. 2. T2 hyperintense focus in right hemi pons, possibly chronic lacunar infarct or prominent perivascular space. 3. Mild paranasal sinus disease. 4. Dermal lesion in right superolateral scalp overlying parietal bone, direct visualization recommended. 5. Normal MRA of the head without contrast. These results will be called to the ordering clinician or representative by the Radiologist Assistant, and communication documented in the PACS or zVision Dashboard. Electronically Signed   By: Mitzi Hansen M.D.   On: 07/03/2016 16:40   Mr Maxine Glenn Head/brain JX Cm  Result Date: 07/03/2016 CLINICAL DATA:  56 y/o M; right leg weakness, facial droop, and slurred speech. EXAM: MRI HEAD WITHOUT CONTRAST MRA HEAD WITHOUT CONTRAST TECHNIQUE: Multiplanar, multiecho pulse sequences of the brain and surrounding structures were obtained without intravenous contrast. Angiographic images of the head were obtained using MRA technique without contrast. COMPARISON:  None. 07/03/2016 CT of the head. FINDINGS: MRI HEAD FINDINGS Brain: 10 x 6 mm focus of low diffusivity within the left posterior limb of internal capsule (series 4, image 22). 3 mm focus of low diffusivity within the right temporal operculum (Series 4, image 19). No additional diffusion signal abnormality.  T2 hyperintense focus in the right hemi pons. No focal mass effect, intracranial hemorrhage, hydrocephalus, or extra-axial collection. Vascular: As below. Skull and upper cervical spine: Normal marrow signal. Sinuses/Orbits: Mild anterior ethmoid sinus mucosal thickening and small mucous retention cyst in the right maxillary sinus. No abnormal signal of mastoid air cells. Orbits are unremarkable. Other: Right superolateral low signal dermal lesion overlying parietal bone measuring up to 10 mm (series 10, image 12). Direct visualization recommended. MRA HEAD FINDINGS Internal carotid arteries:  Patent. Anterior cerebral arteries:  Patent. Middle cerebral arteries: Patent. Anterior communicating artery: Patent. Posterior communicating arteries: Small patent left. No right identified, likely hypoplastic or absent. Posterior cerebral arteries:  Patent. Basilar artery:  Patent. Vertebral arteries:  Patent. No evidence of high-grade stenosis, large vessel occlusion, or aneurysm. IMPRESSION: 1. 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 No associated hemorrhage. Different vascular territories suggests embolic etiology. 2. T2 hyperintense focus in right hemi pons, possibly chronic lacunar infarct or prominent perivascular space. 3. Mild paranasal sinus disease. 4. Dermal lesion in right superolateral scalp overlying parietal bone, direct visualization recommended. 5. Normal MRA of the head without contrast. These results will be called to the ordering clinician or representative by the Radiologist Assistant, and communication documented in the PACS or  zVision Dashboard. Electronically Signed   By: Mitzi Hansen M.D.   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 Does the need for close, 24  hr/day medical supervision in concert with the patient's rehab needs make it unreasonable for this patient to be served in a less intensive setting? Yes 2. Co-Morbidities requiring supervision/potential complications: pain mgt, wound care, post-stroke sequelae,  3. Due to bladder management, bowel management, safety, skin/wound care, disease management, medication administration, pain management and patient education, does the patient require 24 hr/day rehab nursing? Yes 4. Does the patient require coordinated care of a physician, rehab nurse, PT (1-2 hrs/day, 5 days/week), OT (1-2 hrs/day, 5 days/week) and SLP (1-2 hrs/day, 5 days/week) to address physical and functional deficits in the context of the above medical diagnosis(es)? Yes Addressing deficits in the following areas: balance, endurance, locomotion, strength, transferring, bowel/bladder control, bathing, dressing, feeding, grooming, toileting, speech and psychosocial support 5. Can the patient actively participate in an intensive therapy program of at least 3 hrs of therapy per day at least 5 days per week? Yes 6. The potential for patient to make measurable gains while on inpatient rehab is excellent 7. Anticipated functional outcomes upon discharge from inpatient rehab are modified independent  with PT, modified independent and supervision with OT, modified independent with SLP. 8. Estimated rehab length of stay to reach the above functional goals is: 10-15 days 9. Does the patient have adequate social supports and living environment to accommodate these discharge functional goals? Yes 10. Anticipated D/C setting: Home 11. Anticipated post D/C treatments: HH therapy 12. Overall Rehab/Functional Prognosis: excellent  RECOMMENDATIONS: This patient's condition is appropriate for continued rehabilitative care in the following setting: CIR Patient has agreed to participate in recommended program. Yes Note that insurance prior authorization  may be required for reimbursement for recommended care.  Comment: Rehab Admissions Coordinator to follow up.  Thanks,  Ranelle Oyster, MD, [place] [facility]    Charlton Amor., PA-C 07/05/2016    Revision History                        Routing History

## 2016-07-05 NOTE — Progress Notes (Signed)
   VASCULAR SURGERY ASSESSMENT & PLAN:  POD 8 S/P ALIF (L5-S1)  The patient was admitted 2 days ago with the sudden onset of right-sided weakness. MRI shows a small acute infarct in the left posterior limb of the internal capsule and a punctate acute infarct in the right temporal region. Carotid duplex was unremarkable area. Transthoracic echo is fairly unremarkable. Ejection fraction is 45-50%. No cardiac source of embolus was identified.   The only other consideration from a vascular standpoint for the source of embolization would be if he had a DVT and a PFO. This is way down the list on the differential diagnosis. He did have a bilateral venous duplex postop which showed no evidence of DVT. A repeat study is ordered today.   I also reviewed his CT of the abdomen which does show a left rectus hematoma. 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 This seems to be improving. Hemoglobin today is 10.  SUBJECTIVE: No specific complaints.  PHYSICAL EXAM: Vitals:   07/04/16 1726 07/04/16 2140 07/05/16 0100 07/05/16 0446  BP: 135/73 133/76 134/74 130/70  Pulse: (!) 109 (!) 113 (!) 114 (!) 108  Resp: 18 18 18 18   Temp: 98.7 F (37.1 C) 98.1 F (36.7 C) 99.5 F (37.5 C) 99.1 F (37.3 C)  TempSrc: Oral Oral Oral Oral  SpO2: 100%  99% 98%  Weight:      Height:       Some clumsiness to right upper extremity and right lower extremity.  LABS: Lab Results  Component Value Date   WBC 9.9 07/05/2016   HGB 9.9 (L) 07/05/2016   HCT 31.0 (L) 07/05/2016   MCV 87.1 07/05/2016   PLT 343 07/05/2016   Lab Results  Component Value Date   CREATININE 1.22 07/03/2016   Lab Results  Component Value Date   INR 1.07 07/03/2016   Principal Problem:   Stroke [facility](HCC) Active Problems:   Back pain   Anemia   Tachycardia   Right sided weakness   Facial droop  Cari CarawayChris Nilam Quakenbush Beeper:  [phone] 07/05/2016

## 2016-07-05 NOTE — Progress Notes (Signed)
Occupational Therapy Treatment Patient Details Name: Esperanza HeirOllis M Heinsohn MRN: [id number] DOB: Mar 17, 1961 Today's Date: 07/05/2016    History of present illness Pt is 56 y.o. male s/p anterior lumbar fusion L5-S1 @ 1 week ago. Pt admitted with R sided weakness. 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     OT comments  Pt still needing mod to max assist for functional mobility and selfcare tasks simulated sit to stand.  Will continue to benefit from acute OT with transition to CIR level for more intense therapy.  Will continue to follow.    Follow Up Recommendations  CIR;Supervision/Assistance - 24 hour    Equipment Recommendations  3 in 1 bedside commode;Tub/shower bench    Recommendations for Other Services Rehab consult    Precautions / Restrictions Precautions Precautions: Fall;Back Precaution Comments: 3/3 precautions with increased time Required Braces or Orthoses: Spinal Brace Spinal Brace: Lumbar corset;Applied in sitting position Restrictions Weight Bearing Restrictions: No       Mobility Bed Mobility                  Transfers Overall transfer level: Needs assistance Equipment used: 1 person hand held assist;Rolling walker (2 wheeled) (initially with hand held but progressed to need for RW secondary to left knee buckling and weakness) Transfers: Sit to/from Stand Sit to Stand: Mod assist         General transfer comment: Mod assist for initial sit to stand from the bedside chair.  RLE positioned in front of left with increased knee hyperextension noted in standing and with functional transfers.    Balance Overall balance assessment: Needs assistance   Sitting balance-Leahy Scale: Fair     Standing balance support: Bilateral upper extremity supported Standing balance-Leahy Scale: Poor Standing balance comment: Needs UE support on the walker at all times.                    ADL Overall ADL's : Needs assistance/impaired                          Toilet Transfer: Moderate assistance;Ambulation;RW Toilet Transfer Details (indicate cue type and reason): simulated Toileting- Clothing Manipulation and Hygiene: Moderate assistance;Sit to/from stand Toileting - Clothing Manipulation Details (indicate cue type and reason): simulated      Functional mobility during ADLs: Maximal assistance;Rolling walker General ADL Comments: Pt needed mod assist for sit to stand initially without assistive device.  Mod assist to ambulate from the bedside chair to the door of the room, but then pt demonstrated left knee buckling requiring max assist to correct.  Issued pt RW and he was able to ambulate out in the hallway with mod assist.  Moderate support of UEs needed on the walker for balance and advancement of LEs.  Cogwheeling noted with advancement of the LLE.  Issued foam ball for work on [employer] and gross motor coordination.                  Cognition   Behavior During Therapy: WFL for tasks assessed/performed       Current Attention Level: Sustained                              Pertinent Vitals/ Pain       Pain Assessment: 0-10 Pain Score: 2  Pain Location: back  Pain Descriptors / Indicators: Discomfort Pain Intervention(s): Limited  activity within patient's tolerance;Repositioned  Home Living     Available Help at Discharge: Available 24 hours/day (fiance states they will arrange supervision as needed)               Bathroom Shower/Tub: Tub/shower unit;Curtain         Home Equipment:  (bought cane after back surgery)      Lives With: Significant other    Prior Functioning/Environment              Frequency  Min 2X/week        Progress Toward Goals  OT Goals(current goals can now be found in the care plan section)  Progress towards OT goals: Progressing toward goals     Plan Discharge plan remains appropriate       End of Session Equipment Utilized During Treatment:  Gait belt;Rolling walker;Back brace  OT Visit Diagnosis: Unsteadiness on feet (R26.81);Other abnormalities of gait and mobility (R26.89);Repeated falls (R29.6);Muscle weakness (generalized) (M62.81);History of falling (Z91.81);Other symptoms and signs involving the nervous system (R29.898);Hemiplegia and hemiparesis;Pain Hemiplegia - Right/Left: Left Hemiplegia - dominant/non-dominant: Non-Dominant Hemiplegia - caused by: Cerebral infarction Pain - part of body:  (back)   Activity Tolerance Patient tolerated treatment well   Patient Left in chair;with family/visitor present;with call bell/phone within reach   Nurse Communication  Discussed with nursing assist level for transfers        Time: 4098-1191 OT Time Calculation (min): 24 min  Charges: OT General Charges $OT Visit: 1 Procedure OT Treatments $Neuromuscular Re-education: 23-37 mins  Koralynn Greenspan OTR/L 07/05/2016, 3:38 PM

## 2016-07-05 NOTE — Progress Notes (Signed)
Report given to accepting nurse from [facility] room: [id number]

## 2016-07-05 NOTE — Consult Note (Signed)
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 right handed male with history of recent L5-S1 fusion anterior approach 06/28/2016. Per chart review patient lives with spouse. 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 One level home with 1 step to entry. Presented 07/03/2016 with right-sided weakness and facial droop with slurred speech. Cranial CT scan negative for acute changes. 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 No associated hemorrhage. MRA negative. Patient did not receive TPA. Echocardiogram with ejection fraction of 50% no wall motion abnormalities. No cardiac source of emboli identified. Carotid Dopplers with no ICA stenosis. Findings of significant decrease in hemoglobin from 15.2-8.3 since recent back surgery. CT abdomen and pelvis showed an intramuscular hematoma of the left rectus muscle measuring 7 x 3.4 x 9 cm in overlying the left psoas 10.2 x 9 x 4.6 cm. Neurology consulted presently on no anticoagulation due to rectus psoas hematoma. He was placed on subcutaneous Lovenox for DVT prophylaxis 07/03/2016. Patient continues with a back brace lumbar corset after recent back surgery applied in sitting position. Physical therapy evaluation completed 07/04/2016 with recommendations of physical medicine rehabilitation consult.   Review of Systems  Constitutional: Negative for chills and fever.  HENT: Negative for hearing loss.   Eyes: Negative for blurred vision and double vision.  Respiratory: Negative for cough and shortness of breath.   Cardiovascular: Negative for palpitations and leg swelling.       Nonspecific chest pain  Gastrointestinal: Positive for constipation. Negative  for nausea and vomiting.  Genitourinary: Positive for urgency. Negative for dysuria and hematuria.  Musculoskeletal: Positive for back pain and myalgias.  Skin: Negative for rash.  Neurological: Positive for speech change and weakness. Negative for seizures.  All other systems reviewed and are negative.  Past Medical History:  Diagnosis Date  . Anemia   . Arthritis   . Tachycardia    Past Surgical History:  Procedure Laterality Date  . ABDOMINAL EXPOSURE N/A 06/27/2016   Procedure: ABDOMINAL EXPOSURE;  Surgeon: Chuck Hint, MD;  Location[facility] OR;  Service: Vascular;  Laterality: N/A;  . ANTERIOR LUMBAR FUSION N/A 06/27/2016   Procedure: ANTERIOR LUMBAR FUSION L5-S1;  Surgeon: Venita Lick, MD;  Location: [facility] OR;  Service: Orthopedics;  Laterality: N/A;  Requests 3 hours  . FINGER AMPUTATION Left   . SHOULDER SURGERY     History reviewed. No pertinent family history. Social History:  reports that he has never smoked. He has never used smokeless tobacco. He reports that he drinks alcohol. He reports that he does not use drugs. Allergies: No Known Allergies Medications Prior to Admission  Medication Sig Dispense Refill  . acetaminophen (TYLENOL) 325 MG tablet Take 650 mg by mouth every 6 (six) hours as needed (for pain.).    Marland Kitchen enoxaparin (LOVENOX) 40 MG/0.4ML injection Inject 0.4 mLs (40 mg total) into the skin daily. 10 day supply 1 injection per day 10 Syringe 0  . methocarbamol (ROBAXIN) 500 MG tablet Take 1 tablet (500 mg total) by mouth 3 (three) times daily as needed for muscle spasms. 21 tablet 0  . ondansetron (ZOFRAN) 4 MG tablet Take 1 tablet (4 mg total) by mouth every 8 (eight) hours as needed for nausea or vomiting.  20 tablet 0  . oxyCODONE-acetaminophen (PERCOCET) 10-325 MG tablet Take 1 tablet by mouth every 4 (four) hours as needed for pain. 42 tablet 0  . testosterone cypionate (DEPOTESTOSTERONE CYPIONATE) 200 MG/ML injection Inject 300 mg into the muscle every 14  (fourteen) days. 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 Drove. works out regularly.   Lives With: Spouse  Functional History: Prior Function Level of Independence: Independent Functional Status:  Mobility: Bed Mobility Overal bed mobility: Needs Assistance General bed mobility comments: up in chair Transfers Overall transfer level: Needs assistance Equipment used: Rolling walker (2 wheeled) Transfers: Sit to/from Stand Sit to Stand: Mod assist, +2 safety/equipment General transfer comment: lifting assist out of chair due to R foot positioned to L of midline and forward. Family also assisting Ambulation/Gait Ambulation/Gait assistance: Mod assist Ambulation Distance (Feet): 80 Feet Assistive device: Rolling walker (2 wheeled) Gait Pattern/deviations: Step-to pattern, Shuffle, Trunk flexed, Decreased stride length, Decreased dorsiflexion - right, Decreased stance time - right, Ataxic, Wide base of support General Gait Details: Dragging R foot on floor during swing, positions somtimes too far medially, noted with gait out of room L LE ataxic during swing with tremors, but intact placement    ADL: ADL Overall ADL's : Needs assistance/impaired Grooming: Minimal assistance Upper Body Bathing: Minimal assistance, Sitting Lower Body Bathing: Minimal assistance, Sit to/from stand Upper Body Dressing : Moderate  assistance Lower Body Dressing: Moderate assistance, Sit to/from stand Toilet Transfer: Moderate assistance Toileting- Clothing Manipulation and Hygiene: Minimal assistance Functional mobility during ADLs: Moderate assistance General ADL Comments: Pt with R lateral bias while sitting EOB. Pt able to cross R leg over L knee with difficulty but unable to maintain that position.   Cognition: Cognition Overall Cognitive Status: Impaired/Different from baseline Arousal/Alertness: Suspect due to medications (drowsy from Ativan; per fiance) Orientation Level: Oriented X4 Attention: Sustained Sustained Attention: Impaired Sustained Attention Impairment: Verbal basic Memory: Impaired Memory Impairment: Storage deficit, Retrieval deficit, Decreased short term memory Decreased Short Term Memory: Verbal basic Awareness: Appears intact Problem Solving:  (not tested, suspect higher level impaired) Safety/Judgment: Appears intact Cognition Arousal/Alertness: Awake/alert Behavior During Therapy: WFL for tasks assessed/performed Overall Cognitive Status: Impaired/Different from baseline Area of Impairment: Attention, Awareness, Problem solving Current Attention Level: Selective Awareness: Emergent Problem Solving: Slow processing, Decreased initiation, Difficulty sequencing General Comments: will further assess. Pt had 2 falls at home PTA and leaning to R on therapist adn thinks he will be OK to DC home  Blood pressure 130/70, pulse (!) 108, temperature 99.1 F (37.3 C), temperature source Oral, resp. rate 18, height 5' 9.5" (1.765 m), weight 88.9 kg (196 lb), SpO2 98 %. Physical Exam  Constitutional: He appears well-developed.  HENT:  Mild facial droop  Eyes: EOM are normal.  Neck: Normal range of motion. Neck supple. No thyromegaly present.  Cardiovascular: Normal rate, regular rhythm and normal heart sounds.   Respiratory: Effort normal and breath sounds normal. No respiratory distress.    GI: Soft. Bowel sounds are normal. He exhibits no distension.  Musculoskeletal: He exhibits tenderness (low back/abdominal incision).  Neurological: He is alert.  Makes good eye contact with examiner. Speech is mildly dysarthric. Mild right central 7. Follows full commands. Fair awareness of deficits. RUE 3- deltoid, 3 to 3+ biceps, triceps, wrist, hand. LUE: 5/5. LLE: 4 to 4+/5 prox to distal. RLE: 4- HF,KE and 4/5 ADF/PF. Denies sensory loss. Senses pain in all 4's. DTR's 3+ bilaterally in LE's  Skin: Skin is warm and dry.  Abdominal incision intact/ tender    Results for orders placed or performed during the hospital encounter of 07/03/16 (from the past 24 hour(s))  Vitamin B12     Status: None   Collection Time: 07/04/16 12:49 PM  Result Value Ref Range   Vitamin B-12 547 180 - 914 pg/mL  Iron and TIBC     Status: Abnormal   Collection Time: 07/04/16 12:49 PM  Result Value Ref Range   Iron 31 (L) 45 - 182 ug/dL   TIBC 161 096 - 045 ug/dL   Saturation Ratios 11 (L) 17.9 - 39.5 %   UIBC 263 ug/dL  Ferritin     Status: None   Collection Time: 07/04/16 12:49 PM  Result Value Ref Range   Ferritin 175 24 - 336 ng/mL  CBC     Status: Abnormal   Collection Time: 07/05/16  4:14 AM  Result Value Ref Range   WBC 9.9 4.0 - 10.5 K/uL   RBC 3.56 (L) 4.22 - 5.81 MIL/uL   Hemoglobin 9.9 (L) 13.0 - 17.0 g/dL   HCT 40.9 (L) 81.1 - 91.4 %   MCV 87.1 78.0 - 100.0 fL   MCH 27.8 26.0 - 34.0 pg   MCHC 31.9 30.0 - 36.0 g/dL   RDW 78.2 95.6 - 21.3 %   Platelets 343 150 - 400 K/uL   Ct Abdomen Pelvis Wo Contrast  Addendum Date: 07/03/2016   ADDENDUM REPORT: 07/03/2016 17:46 ADDENDUM: Nodular densities as stated in the report along the major fissure on the right may represent small intrafissural lymph nodes. There are right lower lobe 6 and 7 mm nodular densities for which follow-up is recommended. Non-contrast chest CT at 3-6 months is recommended. If the nodules are stable at time of repeat CT,  then future CT at 18-24 months (from today's scan) is considered optional for low-risk patients, but is recommended for high-risk patients. This recommendation follows the consensus statement: Guidelines for Management of Incidental Pulmonary Nodules Detected on CT Images: From the Fleischner Society 2017; Radiology 2017; 284:228-243. Electronically Signed   By: Tollie Eth M.D.   On: 07/03/2016 17:46   Result Date: 07/03/2016 CLINICAL DATA:  Anemia, history of recent back surgery 06/27/2016 EXAM: CT ABDOMEN AND PELVIS WITHOUT CONTRAST TECHNIQUE: Multidetector CT imaging of the abdomen and pelvis was performed following the standard protocol without IV contrast. COMPARISON:  06/28/2016 radiographs of the lumbar spine FINDINGS: Lower chest: Under nodular densities along the right major fissure and right lower lobe possibly representing intrafissural lymph nodes. There are 6 and 7 mm nodular densities in the right lower lobe. No pulmonary consolidation. Minimal pleural thickening and/or atelectasis posteriorly at the left lung base. Hepatobiliary: 6 mm hypodensity in the left hepatic lobe may represent a small cyst or hemangioma. Further characterization not possible given lack of IV contrast. Gallbladder is unremarkable. No biliary dilatation. Pancreas: Normal Spleen: Normal Adrenals/Urinary Tract: No nephrolithiasis nor obstructive uropathy. No focal renal mass identified. Contracted bladder. Stomach/Bowel: Stomach is within normal limits. Appendix appears normal. No evidence  of bowel wall thickening, distention, or inflammatory changes. Vascular/Lymphatic: Small retroperitoneal periaortic and mesenteric lymph nodes, the largest approximately 10 mm short axis. No aneurysm. Reproductive: Prostate is unremarkable. Other: Hematomas of the left lower rectus muscle and superficially over the left psoas muscle are identified, the left psoas measuring 4.6 x 9 x 10.2 cm in the left rectus measuring approximately 7 x 3.4 x  9 cm. Overlying subcutaneous emphysema in the left lower quadrant with postsurgical change noted. Tiny foci of air noted adjacent to the left psoas hematoma believed to be within adjacent bowel or related to postsurgical change. An evolving abscess cannot be entirely excluded. Musculoskeletal: L5-S1 fusion hardware is noted. IMPRESSION: 1. Intramuscular hematoma of the left rectus muscle measuring 7 x 3.4 x 9 cm and overlying the left psoas measuring 10.2 x 9 x 4.6 cm. Adjacent tiny foci of gas are believed to be post pop related or potentially due to adjacent non-opacified small bowel adjacent to the left psoas hematoma. Cannot exclude the possibility of a developing abscess. Clinical correlation and follow-up is suggested. 2. L5-S1 fusion. Electronically Signed: By: Tollie Eth M.D. On: 07/03/2016 17:36   Dg Chest 2 View  Result Date: 07/03/2016 CLINICAL DATA:  Clinical signs of a CVA today. History of tachycardia. EXAM: CHEST  2 VIEW COMPARISON:  Portable chest x-rays of March 30, 2015 FINDINGS: The lungs are well-expanded and clear. The heart and pulmonary vascularity are normal. The mediastinum is normal in width. There is no pleural effusion. The bony thorax is unremarkable. IMPRESSION: There is no active cardiopulmonary disease. Electronically Signed   By: David  [place] M.D.   On: 07/03/2016 15:08   Ct Head Wo Contrast  Result Date: 07/03/2016 CLINICAL DATA:  Altered mental status today.  Right facial droop. EXAM: CT HEAD WITHOUT CONTRAST TECHNIQUE: Contiguous axial images were obtained from the base of the skull through the vertex without intravenous contrast. COMPARISON:  Head CT scan 01/03/2010. FINDINGS: Brain: Appears normal without hemorrhage, infarct, mass lesion, mass effect, midline shift or abnormal extra-axial fluid collection. No hydrocephalus or pneumocephalus. Vascular: Negative. Skull: Intact. Sinuses/Orbits: Small mucous retention cyst or polyp right maxillary sinus is unchanged.  Other: None. IMPRESSION: Negative head CT. Unchanged small mucous retention cyst or polyp right maxillary sinus is noted. Electronically Signed   By: Drusilla Kanner M.D.   On: 07/03/2016 13:38   Mr Brain Wo Contrast  Result Date: 07/03/2016 CLINICAL DATA:  56 y/o M; right leg weakness, facial droop, and slurred speech. EXAM: MRI HEAD WITHOUT CONTRAST MRA HEAD WITHOUT CONTRAST TECHNIQUE: Multiplanar, multiecho pulse sequences of the brain and surrounding structures were obtained without intravenous contrast. Angiographic images of the head were obtained using MRA technique without contrast. COMPARISON:  None. 07/03/2016 CT of the head. FINDINGS: MRI HEAD FINDINGS Brain: 10 x 6 mm focus of low diffusivity within the left posterior limb of internal capsule (series 4, image 22). 3 mm focus of low diffusivity within the right temporal operculum (Series 4, image 19). No additional diffusion signal abnormality. T2 hyperintense focus in the right hemi pons. No focal mass effect, intracranial hemorrhage, hydrocephalus, or extra-axial collection. Vascular: As below. Skull and upper cervical spine: Normal marrow signal. Sinuses/Orbits: Mild anterior ethmoid sinus mucosal thickening and small mucous retention cyst in the right maxillary sinus. No abnormal signal of mastoid air cells. Orbits are unremarkable. Other: Right superolateral low signal dermal lesion overlying parietal bone measuring up to 10 mm (series 10, image 12). Direct visualization recommended. MRA HEAD FINDINGS  Internal carotid arteries:  Patent. Anterior cerebral arteries:  Patent. Middle cerebral arteries: Patent. Anterior communicating artery: Patent. Posterior communicating arteries: Small patent left. No right identified, likely hypoplastic or absent. Posterior cerebral arteries:  Patent. Basilar artery:  Patent. Vertebral arteries:  Patent. No evidence of high-grade stenosis, large vessel occlusion, or aneurysm. IMPRESSION: 1. Small acute infarct  within the left posterior limb of internal capsule and punctate acute infarct in the right temporal operculum. No associated hemorrhage. Different vascular territories suggests embolic etiology. 2. T2 hyperintense focus in right hemi pons, possibly chronic lacunar infarct or prominent perivascular space. 3. Mild paranasal sinus disease. 4. Dermal lesion in right superolateral scalp overlying parietal bone, direct visualization recommended. 5. Normal MRA of the head without contrast. These results will be called to the ordering clinician or representative by the Radiologist Assistant, and communication documented in the PACS or zVision Dashboard. Electronically Signed   By: Mitzi Hansen M.D.   On: 07/03/2016 16:40   Mr Maxine Glenn Head/brain ZO Cm  Result Date: 07/03/2016 CLINICAL DATA:  56 y/o M; right leg weakness, facial droop, and slurred speech. EXAM: MRI HEAD WITHOUT CONTRAST MRA HEAD WITHOUT CONTRAST TECHNIQUE: Multiplanar, multiecho pulse sequences of the brain and surrounding structures were obtained without intravenous contrast. Angiographic images of the head were obtained using MRA technique without contrast. COMPARISON:  None. 07/03/2016 CT of the head. FINDINGS: MRI HEAD FINDINGS Brain: 10 x 6 mm focus of low diffusivity within the left posterior limb of internal capsule (series 4, image 22). 3 mm focus of low diffusivity within the right temporal operculum (Series 4, image 19). No additional diffusion signal abnormality. T2 hyperintense focus in the right hemi pons. No focal mass effect, intracranial hemorrhage, hydrocephalus, or extra-axial collection. Vascular: As below. Skull and upper cervical spine: Normal marrow signal. Sinuses/Orbits: Mild anterior ethmoid sinus mucosal thickening and small mucous retention cyst in the right maxillary sinus. No abnormal signal of mastoid air cells. Orbits are unremarkable. Other: Right superolateral low signal dermal lesion overlying parietal bone  measuring up to 10 mm (series 10, image 12). Direct visualization recommended. MRA HEAD FINDINGS Internal carotid arteries:  Patent. Anterior cerebral arteries:  Patent. Middle cerebral arteries: Patent. Anterior communicating artery: Patent. Posterior communicating arteries: Small patent left. No right identified, likely hypoplastic or absent. Posterior cerebral arteries:  Patent. Basilar artery:  Patent. Vertebral arteries:  Patent. No evidence of high-grade stenosis, large vessel occlusion, or aneurysm. IMPRESSION: 1. 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 No associated hemorrhage. Different vascular territories suggests embolic etiology. 2. T2 hyperintense focus in right hemi pons, possibly chronic lacunar infarct or prominent perivascular space. 3. Mild paranasal sinus disease. 4. Dermal lesion in right superolateral scalp overlying parietal bone, direct visualization recommended. 5. Normal MRA of the head without contrast. These results will be called to the ordering clinician or representative by the Radiologist Assistant, and communication documented in the PACS or zVision Dashboard. Electronically Signed   By: Mitzi Hansen M.D.   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 Does the need for close, 24 hr/day medical supervision in concert with the patient's rehab needs make it unreasonable for this patient to be served in a less intensive setting? Yes 2. Co-Morbidities requiring supervision/potential complications: pain mgt, wound care, post-stroke sequelae,  3. Due to bladder management, bowel management, safety, skin/wound care,  disease management, medication administration, pain management and patient education, does the patient require 24 hr/day rehab nursing?  Yes 4. Does the patient require coordinated care of a physician, rehab nurse, PT (1-2 hrs/day, 5 days/week), OT (1-2 hrs/day, 5 days/week) and SLP (1-2 hrs/day, 5 days/week) to address physical and functional deficits in the context of the above medical diagnosis(es)? Yes Addressing deficits in the following areas: balance, endurance, locomotion, strength, transferring, bowel/bladder control, bathing, dressing, feeding, grooming, toileting, speech and psychosocial support 5. Can the patient actively participate in an intensive therapy program of at least 3 hrs of therapy per day at least 5 days per week? Yes 6. The potential for patient to make measurable gains while on inpatient rehab is excellent 7. Anticipated functional outcomes upon discharge from inpatient rehab are modified independent  with PT, modified independent and supervision with OT, modified independent with SLP. 8. Estimated rehab length of stay to reach the above functional goals is: 10-15 days 9. Does the patient have adequate social supports and living environment to accommodate these discharge functional goals? Yes 10. Anticipated D/C setting: Home 11. Anticipated post D/C treatments: HH therapy 12. Overall Rehab/Functional Prognosis: excellent  RECOMMENDATIONS: This patient's condition is appropriate for continued rehabilitative care in the following setting: CIR Patient has agreed to participate in recommended program. Yes Note that insurance prior authorization may be required for reimbursement for recommended care.  Comment: Rehab Admissions Coordinator to follow up.  Thanks,  Ranelle Oyster, MD, [place] [facility]    Charlton Amor., PA-C 07/05/2016

## 2016-07-05 NOTE — H&P (Signed)
Physical Medicine and Rehabilitation Admission H&P    Chief Complaint  Patient presents with  . Stroke Symptoms  : HPI: Darrell Watson is a 56 y.o. right handed male with history of recent L5-S1 fusion anterior approach 06/28/2016. Per chart review patient lives with spouse. Worked as a Therapist, music with Production designer, theatre/television/film prior to back surgery and was independent. One level home with 1 step to entry. Presented 07/03/2016 with right-sided weakness and facial droop with slurred speech. Cranial CT scan negative for acute changes. 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 No associated hemorrhage. MRA negative. Patient did not receive TPA. Echocardiogram with ejection fraction of 50% no wall motion abnormalities. No cardiac source of emboli identified. Carotid Dopplers with no ICA stenosis. Findings of significant decrease in hemoglobin from 15.2-8.3 since recent back surgery. CT abdomen and pelvis showed an intramuscular hematoma of the left rectus muscle measuring 7 x 3.4 x 9 cm in overlying the left psoas 10.2 x 9 x 4.6 cm.Vascular surgery consulted in regards to rectus psoas hematoma advise conservative care. Neurology consulted presently on no anticoagulation due to rectus psoas hematoma. He was placed on subcutaneous Lovenox for DVT prophylaxis 07/03/2016.Venous Doppler studies lower extremities negative. Patient continues with a back brace lumbar corset after recent back surgery applied in sitting position. Physical therapy evaluation completed 07/04/2016 with recommendations of physical medicine rehabilitation consult.Patient was admitted for a comprehensive rehabilitation program  Review of Systems  Constitutional: Negative for chills and fever.  HENT: Negative for hearing loss and tinnitus.   Eyes: Negative for blurred vision and double vision.  Respiratory: Negative for  shortness of breath.   Cardiovascular: Positive for palpitations. Negative for chest pain and leg swelling.  Gastrointestinal: Positive for constipation.  Genitourinary: Positive for urgency.  Musculoskeletal: Positive for myalgias. Negative for falls.  Skin: Negative for rash.  Neurological: Positive for speech change and weakness. Negative for seizures.  All other systems reviewed and are negative.  Past Medical History:  Diagnosis Date  . Anemia   . Arthritis   . Tachycardia    Past Surgical History:  Procedure Laterality Date  . ABDOMINAL EXPOSURE N/A 06/27/2016   Procedure: ABDOMINAL EXPOSURE;  Surgeon: Angelia Mould, MD;  Location: [facility];  Service: Vascular;  Laterality: N/A;  . ANTERIOR LUMBAR FUSION N/A 06/27/2016   Procedure: ANTERIOR LUMBAR FUSION L5-S1;  Surgeon: Melina Schools, MD;  Location: [facility];  Service: Orthopedics;  Laterality: N/A;  Requests 3 hours  . FINGER AMPUTATION Left   . SHOULDER SURGERY     History reviewed. No pertinent family history. Social History:  reports that he has never smoked. He has never used smokeless tobacco. He reports that he drinks alcohol. He reports that he does not use drugs. Allergies: No Known Allergies Medications Prior to Admission  Medication Sig Dispense Refill  . acetaminophen (TYLENOL) 325 MG tablet Take 650 mg by mouth every 6 (six) hours as needed (for pain.).    Marland Kitchen enoxaparin (LOVENOX) 40 MG/0.4ML injection Inject 0.4 mLs (40 mg total) into the skin daily. 10 day supply 1 injection per day 10 Syringe 0  . methocarbamol (ROBAXIN) 500 MG tablet Take 1 tablet (500 mg total) by mouth 3 (three) times daily as needed for muscle spasms. 21 tablet 0  . ondansetron (ZOFRAN) 4 MG tablet Take 1 tablet (4 mg total) by mouth every 8 (eight) hours as  needed for nausea or vomiting. 20 tablet 0  . oxyCODONE-acetaminophen (PERCOCET) 10-325 MG tablet Take 1 tablet by mouth every 4 (four) hours as needed for pain. 42 tablet 0  .  testosterone cypionate (DEPOTESTOSTERONE CYPIONATE) 200 MG/ML injection Inject 300 mg into the muscle every 14 (fourteen) days. Gets 1.5 ml at Dr office every 2 weeks Next dose is due 06-24-2016      Home: Home Living Family/patient expects to be discharged to:: Private residence Living Arrangements: Spouse/significant other Available Help at Discharge: Family, Available PRN/intermittently Type of Home: House Home Access: Stairs to enter [employer] of Steps: 1 Entrance Stairs-Rails: None Home Layout: One level Bathroom Shower/Tub: Chiropodist: Standard Bathroom Accessibility: Yes Home Equipment: Kasandra Knudsen - single point Additional Comments: Pt worked as Therapist, music with Production designer, theatre/television/film. Drove. works out regularly.   Lives With: Spouse   Functional History: Prior Function Level of Independence: Independent  Functional Status:  Mobility: Bed Mobility Overal bed mobility: Needs Assistance General bed mobility comments: up in chair Transfers Overall transfer level: Needs assistance Equipment used: Rolling walker (2 wheeled) Transfers: Sit to/from Stand Sit to Stand: Mod assist, +2 safety/equipment General transfer comment: lifting assist out of chair due to R foot positioned to L of midline and forward. Family also assisting Ambulation/Gait Ambulation/Gait assistance: Mod assist Ambulation Distance (Feet): 80 Feet Assistive device: Rolling walker (2 wheeled) Gait Pattern/deviations: Step-to pattern, Shuffle, Trunk flexed, Decreased stride length, Decreased dorsiflexion - right, Decreased stance time - right, Ataxic, Wide base of support General Gait Details: Dragging R foot on floor during swing, positions somtimes too far medially, noted with gait out of room L LE ataxic during swing with tremors, but intact placement    ADL: ADL Overall ADL's : Needs assistance/impaired Grooming: Minimal assistance Upper Body Bathing: Minimal  assistance, Sitting Lower Body Bathing: Minimal assistance, Sit to/from stand Upper Body Dressing : Moderate assistance Lower Body Dressing: Moderate assistance, Sit to/from stand Toilet Transfer: Moderate assistance Toileting- Clothing Manipulation and Hygiene: Minimal assistance Functional mobility during ADLs: Moderate assistance General ADL Comments: Pt with R lateral bias while sitting EOB. Pt able to cross R leg over L knee with difficulty but unable to maintain that position.   Cognition: Cognition Overall Cognitive Status: Impaired/Different from baseline Arousal/Alertness: Suspect due to medications (drowsy from Ativan; per fiance) Orientation Level: Oriented X4 Attention: Sustained Sustained Attention: Impaired Sustained Attention Impairment: Verbal basic Memory: Impaired Memory Impairment: Storage deficit, Retrieval deficit, Decreased short term memory Decreased Short Term Memory: Verbal basic Awareness: Appears intact Problem Solving:  (not tested, suspect higher level impaired) Safety/Judgment: Appears intact Cognition Arousal/Alertness: Awake/alert Behavior During Therapy: WFL for tasks assessed/performed Overall Cognitive Status: Impaired/Different from baseline Area of Impairment: Attention, Awareness, Problem solving Current Attention Level: Selective Awareness: Emergent Problem Solving: Slow processing, Decreased initiation, Difficulty sequencing General Comments: will further assess. Pt had 2 falls at home PTA and leaning to R on therapist adn thinks he will be OK to DC home  Physical Exam: Blood pressure 130/70, pulse (!) 108, temperature 99.1 F (37.3 C), temperature source Oral, resp. rate 18, height 5' 9.5" (1.765 m), weight 88.9 kg (196 lb), SpO2 98 %. Physical Exam  Vitals reviewed. Constitutional: He appears well-developed and well-nourished. No distress.  HENT:  Head: Normocephalic.  Eyes: EOM are normal. Left eye exhibits no discharge.  Neck:  Normal range of motion. Neck supple. No tracheal deviation present. No thyromegaly present.  Cardiovascular: Regular rhythm.  Exam reveals no friction rub.  No murmur heard. Respiratory: Effort normal. No respiratory distress. He has no wheezes. He has no rales.  GI: Soft. Bowel sounds are normal. He exhibits no distension.  Skin: No rash noted. He is not diaphoretic. No erythema.  Skin warm and dry Musculoskeltal.low back incisiontender Neurological: He is alert.  Makes good eye contact with examiner. Speech is mildly dysarthric. Mild right central 7. Follows full commands. Reasonable insight and awareness.  RUE 3- deltoid, 3 to 3+ biceps, triceps, wrist, hand. LUE: 5/5. LLE: 4 to 4+/5 prox to distal. RLE: 4- HF,KE and 4/5 ADF/PF. Senses light touch and senses pain in all 4's. DTR's 3+ bilaterally in LE's  Psych: a bit quiet but overall appropriate   Results for orders placed or performed during the hospital encounter of 07/03/16 (from the past 48 hour(s))  Protime-INR     Status: None   Collection Time: 07/03/16 12:24 PM  Result Value Ref Range   Prothrombin Time 13.9 11.4 - 15.2 seconds   INR 1.07   APTT     Status: None   Collection Time: 07/03/16 12:24 PM  Result Value Ref Range   aPTT 32 24 - 36 seconds  CBC     Status: Abnormal   Collection Time: 07/03/16 12:24 PM  Result Value Ref Range   WBC 8.7 4.0 - 10.5 K/uL    Comment: WHITE COUNT CONFIRMED ON SMEAR   RBC 2.99 (L) 4.22 - 5.81 MIL/uL   Hemoglobin 8.3 (L) 13.0 - 17.0 g/dL   HCT 26.3 (L) 39.0 - 52.0 %   MCV 88.0 78.0 - 100.0 fL   MCH 27.8 26.0 - 34.0 pg   MCHC 31.6 30.0 - 36.0 g/dL   RDW 13.4 11.5 - 15.5 %   Platelets 295 150 - 400 K/uL    Comment: PLATELET COUNT CONFIRMED BY SMEAR  Differential     Status: Abnormal   Collection Time: 07/03/16 12:24 PM  Result Value Ref Range   Neutrophils Relative % 80 %   Lymphocytes Relative 7 %   Monocytes Relative 12 %   Eosinophils Relative 1 %   Basophils Relative 0 %    Neutro Abs 7.0 1.7 - 7.7 K/uL   Lymphs Abs 0.6 (L) 0.7 - 4.0 K/uL   Monocytes Absolute 1.0 0.1 - 1.0 K/uL   Eosinophils Absolute 0.1 0.0 - 0.7 K/uL   Basophils Absolute 0.0 0.0 - 0.1 K/uL   RBC Morphology POLYCHROMASIA PRESENT    WBC Morphology MILD LEFT SHIFT (1-5% METAS, OCC MYELO, OCC BANDS)     Comment: VACUOLATED NEUTROPHILS  Comprehensive metabolic panel     Status: Abnormal   Collection Time: 07/03/16 12:24 PM  Result Value Ref Range   Sodium 138 135 - 145 mmol/L   Potassium 3.6 3.5 - 5.1 mmol/L   Chloride 98 (L) 101 - 111 mmol/L   CO2 30 22 - 32 mmol/L   Glucose, Bld 143 (H) 65 - 99 mg/dL   BUN 9 6 - 20 mg/dL   Creatinine, Ser 1.22 0.61 - 1.24 mg/dL   Calcium 8.9 8.9 - 10.3 mg/dL   Total Protein 6.8 6.5 - 8.1 g/dL   Albumin 3.5 3.5 - 5.0 g/dL   AST 43 (H) 15 - 41 U/L   ALT 26 17 - 63 U/L   Alkaline Phosphatase 48 38 - 126 U/L   Total Bilirubin 1.3 (H) 0.3 - 1.2 mg/dL   GFR calc non Af Amer >60 >60 mL/min   GFR calc Af Amer >60 >  60 mL/min    Comment: (NOTE) The eGFR has been calculated using the CKD EPI equation. This calculation has not been validated in all clinical situations. eGFR's persistently <60 mL/min signify possible Chronic Kidney Disease.    Anion gap 10 5 - 15  Urine rapid drug screen (hosp performed)not at [facility]     Status: Abnormal   Collection Time: 07/03/16 12:24 PM  Result Value Ref Range   Opiates POSITIVE (A) NONE DETECTED   Cocaine NONE DETECTED NONE DETECTED   Benzodiazepines NONE DETECTED NONE DETECTED   Amphetamines NONE DETECTED NONE DETECTED   Tetrahydrocannabinol NONE DETECTED NONE DETECTED   Barbiturates NONE DETECTED NONE DETECTED    Comment:        DRUG SCREEN FOR MEDICAL PURPOSES ONLY.  IF CONFIRMATION IS NEEDED FOR ANY PURPOSE, NOTIFY LAB WITHIN 5 DAYS.        LOWEST DETECTABLE LIMITS FOR URINE DRUG SCREEN Drug Class       Cutoff (ng/mL) Amphetamine      1000 Barbiturate      200 Benzodiazepine   341 Tricyclics        962 Opiates          300 Cocaine          300 THC              50   HIV antibody (Routine Testing)     Status: None   Collection Time: 07/03/16 12:24 PM  Result Value Ref Range   HIV Screen 4th Generation wRfx Non Reactive Non Reactive    Comment: (NOTE) Performed At: [facility] [place], [place] [id number] Lindon Romp MD [id number]   Hemoglobin A1c     Status: Abnormal   Collection Time: 07/03/16 12:24 PM  Result Value Ref Range   Hgb A1c MFr Bld 6.5 (H) 4.8 - 5.6 %    Comment: (NOTE)         Pre-diabetes: 5.7 - 6.4         Diabetes: >6.4         Glycemic control for adults with diabetes: <7.0    Mean Plasma Glucose 140 mg/dL    Comment: (NOTE) Performed At: [facility] [place], [place] [id number] Lindon Romp MD [id number]   Lipid panel     Status: None   Collection Time: 07/03/16 12:24 PM  Result Value Ref Range   Cholesterol 106 0 - 200 mg/dL   Triglycerides 111 <150 mg/dL   HDL 45 >40 mg/dL   Total CHOL/HDL Ratio 2.4 RATIO   VLDL 22 0 - 40 mg/dL   LDL Cholesterol 39 0 - 99 mg/dL    Comment:        Total Cholesterol/HDL:CHD Risk Coronary Heart Disease Risk Table                     Men   Women  1/2 Average Risk   3.4   3.3  Average Risk       5.0   4.4  2 X Average Risk   9.6   7.1  3 X Average Risk  23.4   11.0        Use the calculated Patient Ratio above and the CHD Risk Table to determine the patient's CHD Risk.        ATP III CLASSIFICATION (LDL):  <100     mg/dL   Optimal  100-129  mg/dL   Near or Above  Optimal  130-159  mg/dL   Borderline  160-189  mg/dL   High  >190     mg/dL   Very High   Reticulocytes     Status: Abnormal   Collection Time: 07/03/16 12:24 PM  Result Value Ref Range   Retic Ct Pct 4.8 (H) 0.4 - 3.1 %   RBC. 3.04 (L) 4.22 - 5.81 MIL/uL   Retic Count, Manual 145.9 19.0 - 186.0 K/uL  I-stat troponin, ED (not at [facility], [facility])     Status: None    Collection Time: 07/03/16 12:39 PM  Result Value Ref Range   Troponin i, poc 0.00 0.00 - 0.08 ng/mL   Comment 3            Comment: Due to the release kinetics of cTnI, a negative result within the first hours of the onset of symptoms does not rule out myocardial infarction with certainty. If myocardial infarction is still suspected, repeat the test at appropriate intervals.   Urinalysis, Complete w Microscopic     Status: Abnormal   Collection Time: 07/03/16  3:42 PM  Result Value Ref Range   Color, Urine YELLOW YELLOW   APPearance CLEAR CLEAR   Specific Gravity, Urine 1.024 1.005 - 1.030   pH 7.0 5.0 - 8.0   Glucose, UA 50 (A) NEGATIVE mg/dL   Hgb urine dipstick NEGATIVE NEGATIVE   Bilirubin Urine NEGATIVE NEGATIVE   Ketones, ur 20 (A) NEGATIVE mg/dL   Protein, ur 30 (A) NEGATIVE mg/dL   Nitrite NEGATIVE NEGATIVE   Leukocytes, UA NEGATIVE NEGATIVE   RBC / HPF 0-5 0 - 5 RBC/hpf   WBC, UA 0-5 0 - 5 WBC/hpf   Bacteria, UA NONE SEEN NONE SEEN   Squamous Epithelial / LPF NONE SEEN NONE SEEN   Mucous PRESENT   Folate     Status: None   Collection Time: 07/03/16  6:39 PM  Result Value Ref Range   Folate 20.0 >5.9 ng/mL  Prepare RBC     Status: None   Collection Time: 07/03/16  6:39 PM  Result Value Ref Range   Order Confirmation ORDER PROCESSED BY BLOOD BANK   Type and screen [place]     Status: None   Collection Time: 07/03/16  6:46 PM  Result Value Ref Range   ABO/RH(D) O POS    Antibody Screen NEG    Sample Expiration 07/06/2016    Unit Number [id number]    Blood Component Type RED CELLS,LR    Unit division 00    Status of Unit ISSUED,FINAL    Transfusion Status OK TO TRANSFUSE    Crossmatch Result Compatible   CBC     Status: Abnormal   Collection Time: 07/04/16  3:45 AM  Result Value Ref Range   WBC 10.1 4.0 - 10.5 K/uL   RBC 3.41 (L) 4.22 - 5.81 MIL/uL   Hemoglobin 9.5 (L) 13.0 - 17.0 g/dL   HCT 30.0 (L) 39.0 - 52.0 %   MCV 88.0 78.0 -  100.0 fL   MCH 27.9 26.0 - 34.0 pg   MCHC 31.7 30.0 - 36.0 g/dL   RDW 14.3 11.5 - 15.5 %   Platelets 278 150 - 400 K/uL  Vitamin B12     Status: None   Collection Time: 07/04/16 12:49 PM  Result Value Ref Range   Vitamin B-12 547 180 - 914 pg/mL    Comment: (NOTE) This assay is not validated for testing neonatal or myeloproliferative syndrome specimens  for Vitamin B12 levels.   Iron and TIBC     Status: Abnormal   Collection Time: 07/04/16 12:49 PM  Result Value Ref Range   Iron 31 (L) 45 - 182 ug/dL   TIBC 373 428 - 768 ug/dL   Saturation Ratios 11 (L) 17.9 - 39.5 %   UIBC 263 ug/dL  Ferritin     Status: None   Collection Time: 07/04/16 12:49 PM  Result Value Ref Range   Ferritin 175 24 - 336 ng/mL  CBC     Status: Abnormal   Collection Time: 07/05/16  4:14 AM  Result Value Ref Range   WBC 9.9 4.0 - 10.5 K/uL   RBC 3.56 (L) 4.22 - 5.81 MIL/uL   Hemoglobin 9.9 (L) 13.0 - 17.0 g/dL   HCT 11.5 (L) 72.6 - 20.3 %   MCV 87.1 78.0 - 100.0 fL   MCH 27.8 26.0 - 34.0 pg   MCHC 31.9 30.0 - 36.0 g/dL   RDW 55.9 74.1 - 63.8 %   Platelets 343 150 - 400 K/uL   Ct Abdomen Pelvis Wo Contrast  Addendum Date: 07/03/2016   ADDENDUM REPORT: 07/03/2016 17:46 ADDENDUM: Nodular densities as stated in the report along the major fissure on the right may represent small intrafissural lymph nodes. There are right lower lobe 6 and 7 mm nodular densities for which follow-up is recommended. Non-contrast chest CT at 3-6 months is recommended. If the nodules are stable at time of repeat CT, then future CT at 18-24 months (from today's scan) is considered optional for low-risk patients, but is recommended for high-risk patients. This recommendation follows the consensus statement: Guidelines for Management of Incidental Pulmonary Nodules Detected on CT Images: From the Fleischner Society 2017; Radiology 2017; 284:228-243. Electronically Signed   By: Tollie Eth M.D.   On: 07/03/2016 17:46   Result Date:  07/03/2016 CLINICAL DATA:  Anemia, history of recent back surgery 06/27/2016 EXAM: CT ABDOMEN AND PELVIS WITHOUT CONTRAST TECHNIQUE: Multidetector CT imaging of the abdomen and pelvis was performed following the standard protocol without IV contrast. COMPARISON:  06/28/2016 radiographs of the lumbar spine FINDINGS: Lower chest: Under nodular densities along the right major fissure and right lower lobe possibly representing intrafissural lymph nodes. There are 6 and 7 mm nodular densities in the right lower lobe. No pulmonary consolidation. Minimal pleural thickening and/or atelectasis posteriorly at the left lung base. Hepatobiliary: 6 mm hypodensity in the left hepatic lobe may represent a small cyst or hemangioma. Further characterization not possible given lack of IV contrast. Gallbladder is unremarkable. No biliary dilatation. Pancreas: Normal Spleen: Normal Adrenals/Urinary Tract: No nephrolithiasis nor obstructive uropathy. No focal renal mass identified. Contracted bladder. Stomach/Bowel: Stomach is within normal limits. Appendix appears normal. No evidence of bowel wall thickening, distention, or inflammatory changes. Vascular/Lymphatic: Small retroperitoneal periaortic and mesenteric lymph nodes, the largest approximately 10 mm short axis. No aneurysm. Reproductive: Prostate is unremarkable. Other: Hematomas of the left lower rectus muscle and superficially over the left psoas muscle are identified, the left psoas measuring 4.6 x 9 x 10.2 cm in the left rectus measuring approximately 7 x 3.4 x 9 cm. Overlying subcutaneous emphysema in the left lower quadrant with postsurgical change noted. Tiny foci of air noted adjacent to the left psoas hematoma believed to be within adjacent bowel or related to postsurgical change. An evolving abscess cannot be entirely excluded. Musculoskeletal: L5-S1 fusion hardware is noted. IMPRESSION: 1. Intramuscular hematoma of the left rectus muscle measuring 7 x 3.4 x  9 cm and  overlying the left psoas measuring 10.2 x 9 x 4.6 cm. Adjacent tiny foci of gas are believed to be post pop related or potentially due to adjacent non-opacified small bowel adjacent to the left psoas hematoma. Cannot exclude the possibility of a developing abscess. Clinical correlation and follow-up is suggested. 2. L5-S1 fusion. Electronically Signed: By: Ashley Royalty M.D. On: 07/03/2016 17:36   Dg Chest 2 View  Result Date: 07/03/2016 CLINICAL DATA:  Clinical signs of a CVA today. History of tachycardia. EXAM: CHEST  2 VIEW COMPARISON:  Portable chest x-rays of March 30, 2015 FINDINGS: The lungs are well-expanded and clear. The heart and pulmonary vascularity are normal. The mediastinum is normal in width. There is no pleural effusion. The bony thorax is unremarkable. IMPRESSION: There is no active cardiopulmonary disease. Electronically Signed   By: David  Martinique M.D.   On: 07/03/2016 15:08   Ct Head Wo Contrast  Result Date: 07/03/2016 CLINICAL DATA:  Altered mental status today.  Right facial droop. EXAM: CT HEAD WITHOUT CONTRAST TECHNIQUE: Contiguous axial images were obtained from the base of the skull through the vertex without intravenous contrast. COMPARISON:  Head CT scan 01/03/2010. FINDINGS: Brain: Appears normal without hemorrhage, infarct, mass lesion, mass effect, midline shift or abnormal extra-axial fluid collection. No hydrocephalus or pneumocephalus. Vascular: Negative. Skull: Intact. Sinuses/Orbits: Small mucous retention cyst or polyp right maxillary sinus is unchanged. Other: None. IMPRESSION: Negative head CT. Unchanged small mucous retention cyst or polyp right maxillary sinus is noted. Electronically Signed   By: Inge Rise M.D.   On: 07/03/2016 13:38   Mr Brain Wo Contrast  Result Date: 07/03/2016 CLINICAL DATA:  56 y/o M; right leg weakness, facial droop, and slurred speech. EXAM: MRI HEAD WITHOUT CONTRAST MRA HEAD WITHOUT CONTRAST TECHNIQUE: Multiplanar, multiecho pulse  sequences of the brain and surrounding structures were obtained without intravenous contrast. Angiographic images of the head were obtained using MRA technique without contrast. COMPARISON:  None. 07/03/2016 CT of the head. FINDINGS: MRI HEAD FINDINGS Brain: 10 x 6 mm focus of low diffusivity within the left posterior limb of internal capsule (series 4, image 22). 3 mm focus of low diffusivity within the right temporal operculum (Series 4, image 19). No additional diffusion signal abnormality. T2 hyperintense focus in the right hemi pons. No focal mass effect, intracranial hemorrhage, hydrocephalus, or extra-axial collection. Vascular: As below. Skull and upper cervical spine: Normal marrow signal. Sinuses/Orbits: Mild anterior ethmoid sinus mucosal thickening and small mucous retention cyst in the right maxillary sinus. No abnormal signal of mastoid air cells. Orbits are unremarkable. Other: Right superolateral low signal dermal lesion overlying parietal bone measuring up to 10 mm (series 10, image 12). Direct visualization recommended. MRA HEAD FINDINGS Internal carotid arteries:  Patent. Anterior cerebral arteries:  Patent. Middle cerebral arteries: Patent. Anterior communicating artery: Patent. Posterior communicating arteries: Small patent left. No right identified, likely hypoplastic or absent. Posterior cerebral arteries:  Patent. Basilar artery:  Patent. Vertebral arteries:  Patent. No evidence of high-grade stenosis, large vessel occlusion, or aneurysm. IMPRESSION: 1. 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 No associated hemorrhage. Different vascular territories suggests embolic etiology. 2. T2 hyperintense focus in right hemi pons, possibly chronic lacunar infarct or prominent perivascular space. 3. Mild paranasal sinus disease. 4. Dermal lesion in right superolateral scalp overlying parietal bone, direct  visualization recommended. 5. Normal MRA of the head without contrast. These  results will be called to the ordering clinician or representative by the Radiologist Assistant, and communication documented in the PACS or zVision Dashboard. Electronically Signed   By: Kristine Garbe M.D.   On: 07/03/2016 16:40   Mr Jodene Nam Head/brain TZ Cm  Result Date: 07/03/2016 CLINICAL DATA:  56 y/o M; right leg weakness, facial droop, and slurred speech. EXAM: MRI HEAD WITHOUT CONTRAST MRA HEAD WITHOUT CONTRAST TECHNIQUE: Multiplanar, multiecho pulse sequences of the brain and surrounding structures were obtained without intravenous contrast. Angiographic images of the head were obtained using MRA technique without contrast. COMPARISON:  None. 07/03/2016 CT of the head. FINDINGS: MRI HEAD FINDINGS Brain: 10 x 6 mm focus of low diffusivity within the left posterior limb of internal capsule (series 4, image 22). 3 mm focus of low diffusivity within the right temporal operculum (Series 4, image 19). No additional diffusion signal abnormality. T2 hyperintense focus in the right hemi pons. No focal mass effect, intracranial hemorrhage, hydrocephalus, or extra-axial collection. Vascular: As below. Skull and upper cervical spine: Normal marrow signal. Sinuses/Orbits: Mild anterior ethmoid sinus mucosal thickening and small mucous retention cyst in the right maxillary sinus. No abnormal signal of mastoid air cells. Orbits are unremarkable. Other: Right superolateral low signal dermal lesion overlying parietal bone measuring up to 10 mm (series 10, image 12). Direct visualization recommended. MRA HEAD FINDINGS Internal carotid arteries:  Patent. Anterior cerebral arteries:  Patent. Middle cerebral arteries: Patent. Anterior communicating artery: Patent. Posterior communicating arteries: Small patent left. No right identified, likely hypoplastic or absent. Posterior cerebral arteries:  Patent. Basilar artery:  Patent. Vertebral  arteries:  Patent. No evidence of high-grade stenosis, large vessel occlusion, or aneurysm. IMPRESSION: 1. 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 No associated hemorrhage. Different vascular territories suggests embolic etiology. 2. T2 hyperintense focus in right hemi pons, possibly chronic lacunar infarct or prominent perivascular space. 3. Mild paranasal sinus disease. 4. Dermal lesion in right superolateral scalp overlying parietal bone, direct visualization recommended. 5. Normal MRA of the head without contrast. These results will be called to the ordering clinician or representative by the Radiologist Assistant, and communication documented in the PACS or zVision Dashboard. Electronically Signed   By: Kristine Garbe M.D.   On: 07/03/2016 16:40       Medical Problem List and Plan: 1.  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  -admit to inpatient rehab  - Back brace when out of bed 2.  DVT Prophylaxis/Anticoagulation: Subcutaneous Lovenox. Monitor platelet counts of any signs of bleeding. Venous Doppler study negative 3. Pain Management: Oxycodone and Robaxin as needed  -consider pre-treating prior to therapies 4. Mood: Provide emotional support 5. Neuropsych: This patient is capable of making decisions on his own behalf. 6. Skin/Wound Care: Routine skin checks 7. Fluids/Electrolytes/Nutrition: Routine I&O with follow-up chemistries 8. Acute blood loss anemia due to hematoma/post-op. Follow serial Hgb--9.9 today 9. Constipation. Laxative assistance     Post Admission Physician Evaluation: 1. Functional deficits secondary  to recent back surgery and post-op left PLIC and right temporal infarct. 2. Patient  is admitted to receive collaborative, interdisciplinary care between the physiatrist, rehab nursing staff, and therapy team. 3. Patient's level of medical complexity and substantial therapy needs in context of that medical necessity cannot be provided at a lesser intensity of  care such as a SNF. 4. Patient has experienced substantial functional loss from his/her baseline which was documented above under the "Functional History" and "Functional Status" headings.  Judging by the patient's diagnosis, physical exam, and functional history, the patient has potential for functional progress which will result in measurable gains while on inpatient rehab.  These gains will be of substantial and practical use upon discharge  in facilitating mobility and self-care at the household level. 5. Physiatrist will provide 24 hour management of medical needs as well as oversight of the therapy plan/treatment and provide guidance as appropriate regarding the interaction of the two. 6. The Preadmission Screening has been reviewed and patient status is unchanged unless otherwise stated above. 7. 24 hour rehab nursing will assist with bladder management, bowel management, safety, skin/wound care, disease management, medication administration, pain management and patient education  and help integrate therapy concepts, techniques,education, etc. 8. PT will assess and treat for/with: Lower extremity strength, range of motion, stamina, balance, functional mobility, safety, adaptive techniques and equipment, NMR, pain mgt, back precautions, stroke education, ego support.   Goals are: mod I. 9. OT will assess and treat for/with: ADL's, functional mobility, safety, upper extremity strength, adaptive techniques and equipment, NMR, back precautions, stroke education, community reintegration.   Goals are: mod I to supervision. Therapy may proceed with showering this patient. 10. SLP will assess and treat for/with: speech, communication,  education.  Goals are: mod I. 11. Case Management and Social Worker will assess and treat for psychological issues and discharge planning. 12. Team conference will be held weekly to assess progress toward goals and to determine barriers to discharge. 13. Patient will receive at least 3 hours of therapy per day at least 5 days per week. 14. ELOS: 15-15 days       15. Prognosis:  excellent     Meredith Staggers, MD, [facility] 07/05/2016  Cathlyn Parsons., PA-C 07/05/2016

## 2016-07-05 NOTE — Progress Notes (Signed)
VASCULAR LAB PRELIMINARY  PRELIMINARY  PRELIMINARY  PRELIMINARY  Bilateral lower extremity venous duplex completed.    Preliminary report:  Bilateral:  No evidence of DVT, superficial thrombosis, or Baker's Cyst.   Keoni Risinger, RVS 07/05/2016, 10:00 AM

## 2016-07-05 NOTE — Care Management Note (Addendum)
Case Management Note  Patient Details  Name: Darrell Watson MRN: [id number] Date of Birth: 03-23-1961  Subjective/Objective: Pt presented for stroke- from home with significant other. CIR has assessed the patient and is appropriate for CIR. Insurance Pending @ this time.                Action/Plan: Plan for possible d/c to CIR today vs Saturday. No further needs from CM at this time.   Expected Discharge Date:                  Expected Discharge Plan:  IP Rehab Facility  In-House Referral:  Clinical Social Work  Discharge planning Services  CM Consult  Post Acute Care Choice:  NA Choice offered to:  NA  DME Arranged:  N/A DME Agency:  NA  HH Arranged:  NA HH Agency:  NA  Status of Service:  Completed, signed off  If discussed at [employer] Length of Stay Meetings, dates discussed:    Additional Comments: 1539 07-05-16 Tomi BambergerBrenda Graves-Bigelow, RN,BSN [phone] Plan will be to d/c to CIR 07-05-16. No further needs from CM at this time.  Gala LewandowskyGraves-Bigelow, Krisa Blattner Kaye, RN 07/05/2016, 12:40 PM

## 2016-07-05 NOTE — PMR Pre-admission (Signed)
PMR Admission Coordinator Pre-Admission Assessment  Patient: Darrell Watson is an 56 y.o., male MRN: [id number] DOB: [birth year] Height: 5' 9.5" (176.5 cm) Weight: 88.9 kg (196 lb)              Insurance Information HMO:     PPO:      PCP:      IPA:      80/20:      OTHER:  PRIMARY: [employer]      Policy#: [id number]      Subscriber: pt CM Name: Penelope GalasKelly Robbins      Phone#: [phone]     Fax#: EPIC access Pre-Cert#: [id number]      Employer: [employer] Benefits:  Phone #: [phone]     Name: 07/05/2016 Eff. Date: 04/30/15     Deduct: $550  Out of Pocket Max: $2500      Life Max: none CIR: $300 co pay per admit then covers 85%      SNF: 85% Outpatient: $30 co pay per visit     Co-Pay: 60 visits Home Health: 85%      Co-Pay: 60 visits DME: 85%     Co-Pay:  Providers: in network  SECONDARY: none      Medicaid Application Date:       Case Manager:  Disability Application Date:       Case Worker:   Emergency Conservator, museum/galleryContact Information Contact Information    Name Relation Home Work Mobile   [place],Malvina Significant other   [phone]   Jannet Askewenn,Justin Son   [phone]     Current Medical History  Patient Admitting Diagnosis:  Recent lumbar fusion, postoperative embolic left posterior limb internal capsule/right temporal/operculum infarct  History of Present Illness:    : HPI: Darrell Watson a 56 y.o.right handed malewith history of recent L5-S1 fusion anterior approach 06/28/2016. Presented 07/03/2016 with right-sided weakness and facial droop with slurred speech. Cranial CT scan negative for acute changes. 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 No associated hemorrhage. MRA negative. Patient did not receive TPA. Echocardiogram with ejection fraction of 50% no wall motion abnormalities. No cardiac source of emboli identified. Carotid Dopplers with no ICA stenosis.  Findings of significant decrease in hemoglobin from 15.2-8.3 since recent back surgery. CT abdomen and pelvis showed an intramuscular hematoma of the left rectus muscle measuring 7 x 3.4 x 9 cm in overlying the left psoas10.2 x 9 x 4.6 cm.Vascular surgery consulted in regards to rectus psoas hematoma advise conservative care. Neurology consulted presently on no anticoagulation due to rectus psoashematoma. He was placed on subcutaneous Lovenox for DVT prophylaxis 07/03/2016.Venous Doppler studies lower extremities negative. Patient continues with a back brace lumbar corset after recent back surgery applied in sitting position.  Total: 4 NIH  Past Medical History  Past Medical History:  Diagnosis Date  . Anemia   . Arthritis   . Tachycardia     Family History  family history is not on file.  Prior Rehab/Hospitalizations:  Has the patient had major surgery during 100 days prior to admission? Yes  Current Medications   Current Facility-Administered Medications:  .   stroke: mapping our early stages of recovery book, , Does not apply, Once, Gwenyth BenderKaren M Black, NP .  0.9 %  sodium chloride infusion, , Intravenous, Once, Lesle ChrisKaren M Black, NP .  acetaminophen (TYLENOL) tablet 650 mg, 650 mg, Oral, Q4H PRN **OR** acetaminophen (  TYLENOL) solution 650 mg, 650 mg, Per Tube, Q4H PRN **OR** acetaminophen (TYLENOL) suppository 650 mg, 650 mg, Rectal, Q4H PRN, Lesle Chris Black, NP .  enoxaparin (LOVENOX) injection 40 mg, 40 mg, Subcutaneous, Q24H, Lesle Chris Black, NP, 40 mg at 07/04/16 2055 .  methocarbamol (ROBAXIN) tablet 500 mg, 500 mg, Oral, TID PRN, Lesle Chris Black, NP .  oxyCODONE-acetaminophen (PERCOCET) 7.5-325 MG per tablet 2 tablet, 2 tablet, Oral, Q6H PRN, Gwenyth Bender, NP .  senna-docusate (Senokot-S) tablet 1 tablet, 1 tablet, Oral, BID, Gwenyth Bender, NP, 1 tablet at 07/05/16 1024  Patients Current Diet: Diet regular Room service appropriate? Yes; Fluid consistency: Thin  Precautions /  Restrictions Precautions Precautions: Fall, Back Spinal Brace: Lumbar corset, Applied in sitting position Restrictions Weight Bearing Restrictions: No   Has the patient had 2 or more falls or a fall with injury in the past year?Yes since back surgery  Prior Activity Level Community (5-7x/wk): recent back surgery pta. 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 cane  Prior Functional Level Prior Function Level of Independence: Independent  Self Care: Did the patient need help bathing, dressing, using the toilet or eating?  Needed some help  Indoor Mobility: Did the patient need assistance with walking from room to room (with or without device)? Independent  Stairs: Did the patient need assistance with internal or external stairs (with or without device)? Needed some help  Functional Cognition: Did the patient need help planning regular tasks such as shopping or remembering to take medications? Needed some help  Current Functional Level Cognition  Arousal/Alertness: Awake/alert Overall Cognitive Status: Impaired/Different from baseline Current Attention Level: Selective Orientation Level: Oriented X4 General Comments: will further assess. Pt had 2 falls at home PTA and leaning to R on therapist adn thinks he will be OK to DC home Attention: Sustained Sustained Attention: Impaired Sustained Attention Impairment: Verbal basic Memory: Impaired Memory Impairment: Storage deficit, Retrieval deficit, Decreased short term memory Decreased Short Term Memory: Verbal basic Awareness: Appears intact Problem Solving:  (not tested, suspect higher level impaired) Safety/Judgment: Appears intact     Extremity Assessment (includes Sensation/Coordination)  Upper Extremity Assessment: Defer to OT evaluation RUE Deficits / Details: Isolated movement present. Strength overall 4/5. "clumsy" use of R hand. Difficulty with coordination and in-hand manipulation skills; Difficulty with finger to nose; . Brunstrom stage VI arm; V hand.  RUE Coordination: decreased fine motor, decreased gross motor  Lower Extremity Assessment: RLE deficits/detail, LLE deficits/detail RLE Deficits / Details: AROM WFL except limited DF due to weakness and stiffness with positive clonus.  Strength hip flexion 4-/5, knee extension 4/5, ankle DF 3-/5 LLE Deficits / Details: AROM WFL, positive clonus, strength hip flexion 4+/5, knee extension 5/5, ankle DF 4+/5, coordination intact heel to shin and toe taps, but note ataxic movements during gait    ADLs  Overall ADL's : Needs assistance/impaired Grooming: Minimal assistance Upper Body Bathing: Minimal assistance, Sitting Lower Body Bathing: Minimal assistance, Sit to/from stand Upper Body Dressing : Moderate assistance Lower Body Dressing: Moderate assistance, Sit to/from stand Toilet Transfer: Moderate assistance Toileting- Clothing Manipulation and Hygiene: Minimal assistance Functional mobility during ADLs: Moderate assistance General ADL Comments: Pt with R lateral bias while sitting EOB. Pt able to cross R leg over  L knee with difficulty but unable to maintain that position.     Mobility  Overal bed mobility: Needs Assistance General bed mobility comments: up in chair    Transfers  Overall transfer level: Needs assistance Equipment used: Rolling walker (2 wheeled) Transfers: Sit to/from Stand Sit to Stand: Mod assist, +2 safety/equipment General transfer comment: lifting assist out of chair due to R foot positioned to L of midline and forward. Family also assisting    Ambulation / Gait / Stairs / Psychologist, prison and probation services  Ambulation/Gait Ambulation/Gait  assistance: Mod assist Ambulation Distance (Feet): 80 Feet Assistive device: Rolling walker (2 wheeled) Gait Pattern/deviations: Step-to pattern, Shuffle, Trunk flexed, Decreased stride length, Decreased dorsiflexion - right, Decreased stance time - right, Ataxic, Wide base of support General Gait Details: Dragging R foot on floor during swing, positions somtimes too far medially, noted with gait out of room L LE ataxic during swing with tremors, but intact placement    Posture / Balance Balance Overall balance assessment: Needs assistance Sitting balance-Leahy Scale: Fair Standing balance support: Bilateral upper extremity supported Standing balance-Leahy Scale: Poor Standing balance comment: UE support for balance with poor positioning at times of R foot, though testing demonstrates intact proprioception,     Special needs/care consideration BiPAP/CPAP  N/a CPM  N/a Continuous Drip IV  N/a Dialysis  N/a Life Vest  N/a Oxygen n/a Special Bed  N/a Trach Size  N/a Wound Vac (area)  N/a Skin surgical incision Bowel mgmt: continent LBM 3/8 Bladder mgmt: continent Diabetic mgmt n/a   Previous Home Environment Living Arrangements: Spouse/significant other  Lives With: Significant other Available Help at Discharge: Available 24 hours/day (fiance states they will arrange supervision as needed) Type of Home: House Home Layout: One level Home Access: Stairs to enter Entrance Stairs-Rails: None Entrance Stairs-Number of Steps: 1 Bathroom Shower/Tub: Hydrographic surveyor, Engineer, building services: Standard Bathroom Accessibility: Yes How Accessible: Accessible via walker Home Care Services: No Additional Comments: 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 Drove. works out regularly.   Discharge Living Setting Plans for Discharge Living Setting: Patient's home, Lives with (comment) (fiance) Type of Home at Discharge: House Discharge Home Layout: One level Discharge  Home Access: Stairs to enter Entrance Stairs-Rails: None Entrance Stairs-Number of Steps: 1 Discharge Bathroom Shower/Tub: Tub/shower unit, Curtain Discharge Bathroom Toilet: Standard Discharge Bathroom Accessibility: Yes How Accessible: Accessible via walker Does the patient have any problems obtaining your medications?: No  Social/Family/Support Systems Patient Roles: Partner, Parent, Other (Comment) (fulltime employee) Contact Information: Kandis Fantasia Anticipated Caregiver: fiance and son Anticipated Caregiver's Contact Information: see above Ability/Limitations of Caregiver: faince and son work but will arrnage as recommended Caregiver Availability: 24/7 Discharge Plan Discussed with Primary Caregiver: Yes Is Caregiver In Agreement with Plan?: Yes Does Caregiver/Family have Issues with Lodging/Transportation while Pt is in Rehab?: No   Pt's fiance is a CNA. His son is his Psychologist, educational. They are very hands on in his care. I have directed them not to be getting patient up in his room without the assistance of the rehab staff.   Goals/Additional Needs Patient/Family Goal for Rehab: Mod I with PT, Mod I to supervision OT, Mod I with SLP Expected length of stay: ELOS 10-12 days Pt/Family Agrees to Admission and willing to participate: Yes Program Orientation Provided & Reviewed with Pt/Caregiver Including Roles  & Responsibilities: Yes  Decrease burden of Care through IP rehab admission: n/a  Possible need for SNF placement upon discharge: not anticipated  Patient Condition: This patient's  condition remains as documented in the consult dated 07/05/2016, in which the Rehabilitation Physician determined and documented that the patient's condition is appropriate for intensive rehabilitative care in an inpatient rehabilitation facility. Will admit to inpatient rehab today.  Preadmission Screen Completed By:  Clois Dupes, 07/05/2016 1:55  PM ______________________________________________________________________   Discussed status with Dr. Riley Kill on 07/05/2016 at  1354 and received telephone approval for admission today.  Admission Coordinator:  Clois Dupes, time 6045 Date 07/05/2016

## 2016-07-05 NOTE — Progress Notes (Signed)
Physical Therapy Treatment Patient Details Name: Darrell Watson MRN: [id number] DOB: [birth year] Today's Date: 07/05/2016    History of Present Illness Pt is 56 y.o. male s/p anterior lumbar fusion L5-S1 @ 1 week ago. Pt admitted with R sided weakness. 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      PT Comments    Patient progressing with gait distance and able to progress R LE easier when assisted for L lateral weight shift.  Continues to demonstrate tremor versus spastic movement on L LE in swing phase.  Able to stand without UE support with wide BOS to work on balance today as well.  Continue to recommend CIR level rehab at d/c.   Follow Up Recommendations  CIR     Equipment Recommendations  Rolling walker with 5" wheels    Recommendations for Other Services       Precautions / Restrictions Precautions Precautions: Fall;Back Precaution Comments: 3/3 precautions with increased time Required Braces or Orthoses: Spinal Brace Spinal Brace: Lumbar corset;Applied in sitting position Restrictions Weight Bearing Restrictions: No    Mobility  Bed Mobility Overal bed mobility: Needs Assistance   Rolling: Supervision Sidelying to sit: Min guard       General bed mobility comments: son assist to sit up, pt needing increased time and heavy UE support  Transfers Overall transfer level: Needs assistance Equipment used: Rolling walker (2 wheeled) Transfers: Sit to/from Stand Sit to Stand: Mod assist;Max assist         General transfer comment: cues for foot position and hand placement prior to standing due to R foot positioned forward and medially, lifting help from EOB, lowering help to toilet in bathroom with rail and noted LOB to R  Ambulation/Gait Ambulation/Gait assistance: Mod assist Ambulation Distance (Feet): 120 Feet Assistive device: Rolling walker (2 wheeled) Gait Pattern/deviations: Step-to pattern;Step-through pattern;Decreased dorsiflexion -  right;Decreased stride length;Decreased weight shift to left;Narrow base of support     General Gait Details: facilitation for L lateral weight shift to improve R step length, cues for stepping with wide stance on R and continue to note L LE spastic movement during swing, stopped to stand and rebalance 3 times during gait due to R lateral lean   Stairs            Wheelchair Mobility    Modified Rankin (Stroke Patients Only) Modified Rankin (Stroke Patients Only) Pre-Morbid Rankin Score: Moderate disability Modified Rankin: Moderately severe disability     Balance Overall balance assessment: Needs assistance Sitting-balance support: No upper extremity supported Sitting balance-Leahy Scale: Fair     Standing balance support: Bilateral upper extremity supported Standing balance-Leahy Scale: Poor Standing balance comment: initially leaning R so UE support for balance, after ambulation able to stand without UE support with S; able to reach forward about 2-3" outside of BOS, head turns to look to R and to L with close S                     Cognition Arousal/Alertness: Awake/alert Behavior During Therapy: WFL for tasks assessed/performed Overall Cognitive Status: Impaired/Different from baseline Area of Impairment: Attention;Awareness   Current Attention Level: Sustained       Awareness: Emergent        Exercises      General Comments        Pertinent Vitals/Pain Pain Assessment: 0-10 Pain Score: 2  Faces Pain Scale: Hurts a little bit Pain Location: back  Pain Descriptors /  Indicators: Discomfort Pain Intervention(s): Monitored during session    Home Living                      Prior Function            PT Goals (current goals can now be found in the care plan section) Progress towards PT goals: Progressing toward goals    Frequency    Min 4X/week      PT Plan Current plan remains appropriate    Co-evaluation              End of Session Equipment Utilized During Treatment: Gait belt;Back brace Activity Tolerance: Patient tolerated treatment well Patient left: in chair;with call bell/phone within reach;with family/visitor present   PT Visit Diagnosis: Ataxic gait (R26.0);Hemiplegia and hemiparesis;Other abnormalities of gait and mobility (R26.89) Hemiplegia - Right/Left: Right Hemiplegia - dominant/non-dominant: Dominant Hemiplegia - caused by: Cerebral infarction     Time: 1110-1140 PT Time Calculation (min) (ACUTE ONLY): 30 min  Charges:  $Gait Training: 23-37 mins                    G Codes:       Elray McgregorCynthia Wynn 07/05/2016, 4:40 PM  Sheran Lawlessyndi Wynn, PT [phone] 07/05/2016

## 2016-07-05 NOTE — Progress Notes (Signed)
STROKE TEAM PROGRESS NOTE   SUBJECTIVE (INTERVAL HISTORY) His family is at the bedside.  He is just back from vascular lab. He is up in the chair, eating lunch, talkative.    OBJECTIVE Temp:  [98.1 F (36.7 C)-99.5 F (37.5 C)] 99.1 F (37.3 C) (03/09 0446) Pulse Rate:  [108-122] 108 (03/09 0446) Cardiac Rhythm: Sinus tachycardia (03/09 0711) Resp:  [18] 18 (03/09 0446) BP: (130-139)/(70-76) 130/70 (03/09 0446) SpO2:  [98 %-100 %] 98 % (03/09 0446)  CBC:   Recent Labs Lab 07/03/16 1224 07/04/16 0345 07/05/16 0414  WBC 8.7 10.1 9.9  NEUTROABS 7.0  --   --   HGB 8.3* 9.5* 9.9*  HCT 26.3* 30.0* 31.0*  MCV 88.0 88.0 87.1  PLT 295 278 343    Basic Metabolic Panel:   Recent Labs Lab 07/03/16 1224  NA 138  K 3.6  CL 98*  CO2 30  GLUCOSE 143*  BUN 9  CREATININE 1.22  CALCIUM 8.9    PHYSICAL EXAM Pleasant well-built middle-aged African-American male currently not in distress. . Afebrile. Head is nontraumatic. Neck is supple without bruit.    Cardiac exam no murmur or gallop. Lungs are clear to auscultation. Distal pulses are well felt. 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 Mild right lower face asymmetry. Tongue midline. No drift. Mild diminished fine finger movements on right Orbits right over left upper extremity. Mild right grip and hip flexorweakness.. Normal sensation . Normal coordination.  ASSESSMENT/PLAN Mr. Esperanza HeirOllis M Gest is a 56 y.o. male with history of recent L5-S1 repair presenting with R sided weakness. He did not receive IV t-PA due to delay in arrival.   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 L PLIC felt due to small vessel disease source. The R temporal infarct source unknown  CT head negative. R maxillary retention cyst/polyp  MRI  Small L PLIC infarct and punctate R temporal operculum infarct. Dermal lesion R  scalp overlying parietal  MRA  Unremarkable   Carotid Doppler  1-39% B ICA, VA antegrade  2D Echo  pending   LDL 39  HgbA1c 6.5  Lovenox 40 mg sq daily for VTE prophylaxis Diet regular Room service appropriate? Yes; Fluid consistency: Thin  No antithrombotic prior to admission, now on No antithrombotic give rectus hematoma  Not a candidate for further embolic workup as he is not an anticoagulation candidate d/t to rectus sheath/psoas hematoma  Therapy recommendations:  pending   Disposition:  pending   Other Stroke Risk Factors  ETOH use  Overweight, Body mass index is 28.53 kg/m.  Other Active Problems  Tachycardia  UDS positive for opiates  L rectus hematoma, L psoas hematoma with Anemia 15.2->8.3 post op  Back pain s/p L5-S1 fusion    I have personally examined this patient, reviewed notes, independently viewed imaging studies, participated in medical decision making and plan of care.ROS completed by me personally and pertinent positives fully documented  I have made any additions or clarifications directly to the above note.    He presented with mild right-sided weakness secondary to small lacunar left subcortical infarct. There is a tiny right temporal cortical infarct as well which is of unclear significance and clinically silent. Recommend hold aspirin for stroke prevention, for at least 1 week given patient's history of intramuscular back hematoma following back surgery one week ago and  low hematocrit continue ongoing stroke workup and aggressive risk factor modification.  Stroke team will sign off. Kindly call for questions. Follow-up as an outpatient in stroke clinic in 6 weeks. Discussed with patient, wife and son at the bedside and answered questions Delia Heady, MD Medical Director [facility] Pager: [phone] 07/05/2016 1:50 PM  To contact Stroke Continuity provider, please refer to [email or web address]. After hours, contact General Neurology

## 2016-07-05 NOTE — Progress Notes (Signed)
Standley Brooking, RN Rehab Admission Coordinator Signed Physical Medicine and Rehabilitation  PMR Pre-admission Date of Service: 07/05/2016 12:19 PM  Related encounter: ED to Hosp-Admission (Current) from 07/03/2016 in [facility] 15M NEURO MEDICAL       [] Hide copied text PMR Admission Coordinator Pre-Admission Assessment  Patient: Darrell Watson is an 56 y.o., male MRN: [id number] DOB: 25-Apr-1961 Height: 5' 9.5" (176.5 cm) Weight: 88.9 kg (196 lb)                                                                                                                                                  Insurance Information HMO:     PPO:      PCP:      IPA:      80/20:      OTHER:  PRIMARY: [employer]      Policy#: [id number]      Subscriber: pt CM Name: Darrell Watson      Phone#: [phone]     Fax#: EPIC access Pre-Cert#: [id number]      Employer: [employer] Benefits:  Phone #: [phone]     Name: 07/05/2016 Eff. Date: 04/30/15     Deduct: $550  Out of Pocket Max: $2500      Life Max: none CIR: $300 co pay per admit then covers 85%      SNF: 85% Outpatient: $30 co pay per visit     Co-Pay: 60 visits Home Health: 85%      Co-Pay: 60 visits DME: 85%     Co-Pay:  Providers: in network  SECONDARY: none      Medicaid Application Date:       Case Manager:  Disability Application Date:       Case Worker:   Emergency Actuary Information    Name Relation Home Work Mobile   [place] Significant other   [phone]   Steen, Bisig   [phone]     Current Medical History  Patient Admitting Diagnosis:  Recent lumbar fusion, postoperative embolic left posterior limb internal capsule/right temporal/operculum infarct  History of Present Illness:    : HPI: Darrell Watson a 56 y.o.right handed malewith history of recent L5-S1 fusion anterior approach 06/28/2016. Presented 07/03/2016 with right-sided weakness and facial  droop with slurred speech. Cranial CT scan negative for acute changes. 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 No associated hemorrhage. MRA negative. Patient did not receive TPA. Echocardiogram with ejection fraction of 50% no wall motion abnormalities. No cardiac source of emboli identified. Carotid Dopplers with no ICA stenosis. Findings of significant decrease in hemoglobin from 15.2-8.3 since recent back surgery. CT abdomen and pelvis showed an intramuscular hematoma of the  left rectus muscle measuring 7 x 3.4 x 9 cm in overlying the left psoas10.2 x 9 x 4.6 cm.Vascular surgery consulted in regards to rectus psoashematoma advise conservative care.Neurology consulted presently on no anticoagulation due to rectus psoashematoma. He was placed on subcutaneous Lovenox for DVT prophylaxis 07/03/2016.Venous Doppler studies lower extremities negative.Patient continues with a back brace lumbar corset after recent back surgery applied in sitting position.  Total: 4 NIH  Past Medical History      Past Medical History:  Diagnosis Date  . Anemia   . Arthritis   . Tachycardia     Family History  family history is not on file.  Prior Rehab/Hospitalizations:  Has the patient had major surgery during 100 days prior to admission? Yes  Current Medications   Current Facility-Administered Medications:  .   stroke: mapping our early stages of recovery book, , Does not apply, Once, Gwenyth Bender, NP .  0.9 %  sodium chloride infusion, , Intravenous, Once, Lesle Chris Black, NP .  acetaminophen (TYLENOL) tablet 650 mg, 650 mg, Oral, Q4H PRN **OR** acetaminophen (TYLENOL) solution 650 mg, 650 mg, Per Tube, Q4H PRN **OR** acetaminophen (TYLENOL) suppository 650 mg, 650 mg, Rectal, Q4H PRN, Lesle Chris Black, NP .  enoxaparin (LOVENOX) injection 40 mg, 40 mg, Subcutaneous, Q24H, Lesle Chris Black, NP, 40  mg at 07/04/16 2055 .  methocarbamol (ROBAXIN) tablet 500 mg, 500 mg, Oral, TID PRN, Lesle Chris Black, NP .  oxyCODONE-acetaminophen (PERCOCET) 7.5-325 MG per tablet 2 tablet, 2 tablet, Oral, Q6H PRN, Gwenyth Bender, NP .  senna-docusate (Senokot-S) tablet 1 tablet, 1 tablet, Oral, BID, Gwenyth Bender, NP, 1 tablet at 07/05/16 1024  Patients Current Diet: Diet regular Room service appropriate? Yes; Fluid consistency: Thin  Precautions / Restrictions Precautions Precautions: Fall, Back Spinal Brace: Lumbar corset, Applied in sitting position Restrictions Weight Bearing Restrictions: No   Has the patient had 2 or more falls or a fall with injury in the past year?Yes since back surgery  Prior Activity Level Community (5-7x/wk): recent back surgery pta. 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 cane  Prior Functional Level Prior Function Level of Independence: Independent  Self Care: Did the patient need help bathing, dressing, using the toilet or eating?  Needed some help  Indoor Mobility: Did the patient need assistance with walking from room to room (with or without device)? Independent  Stairs: Did the patient need assistance with internal or external stairs (with or without device)? Needed some help  Functional Cognition: Did the patient need help planning regular tasks such as shopping or remembering to take medications? Needed some help  Current Functional Level Cognition  Arousal/Alertness: Awake/alert Overall Cognitive Status: Impaired/Different from baseline Current Attention Level: Selective Orientation Level: Oriented X4 General Comments: will further assess. Pt had 2 falls  at home PTA and leaning to R on therapist adn thinks he will be OK to DC home Attention: Sustained Sustained Attention: Impaired Sustained Attention Impairment: Verbal basic Memory: Impaired Memory Impairment: Storage deficit, Retrieval deficit, Decreased short term memory Decreased Short Term Memory: Verbal basic Awareness: Appears intact Problem Solving:  (not tested, suspect higher level impaired) Safety/Judgment: Appears intact    Extremity Assessment (includes Sensation/Coordination)  Upper Extremity Assessment: Defer to OT evaluation  RUE Deficits / Details: Isolated movement present. Strength overall 4/5. "clumsy" use of R hand. Difficulty with coordination and in-hand manipulation skills; Difficulty with finger to nose; . Brunstrom stage VI arm; V hand.  RUE Coordination: decreased fine motor, decreased gross motor  Lower Extremity Assessment: RLE deficits/detail, LLE deficits/detail RLE Deficits / Details: AROM WFL except limited DF due to weakness and stiffness with positive clonus.  Strength hip flexion 4-/5, knee extension 4/5, ankle DF 3-/5 LLE Deficits / Details: AROM WFL, positive clonus, strength hip flexion 4+/5, knee extension 5/5, ankle DF 4+/5, coordination intact heel to shin and toe taps, but note ataxic movements during gait    ADLs  Overall ADL's : Needs assistance/impaired Grooming: Minimal assistance Upper Body Bathing: Minimal assistance, Sitting Lower Body Bathing: Minimal assistance, Sit to/from stand Upper Body Dressing : Moderate assistance Lower Body Dressing: Moderate assistance, Sit to/from stand Toilet Transfer: Moderate assistance Toileting- Clothing Manipulation and Hygiene: Minimal assistance Functional mobility during ADLs: Moderate assistance General ADL Comments: Pt with R lateral bias while sitting EOB. Pt able to cross R leg over L knee with difficulty but unable to maintain that position.     Mobility  Overal bed mobility: Needs  Assistance General bed mobility comments: up in chair    Transfers  Overall transfer level: Needs assistance Equipment used: Rolling walker (2 wheeled) Transfers: Sit to/from Stand Sit to Stand: Mod assist, +2 safety/equipment General transfer comment: lifting assist out of chair due to R foot positioned to L of midline and forward. Family also assisting    Ambulation / Gait / Stairs / Psychologist, prison and probation services  Ambulation/Gait Ambulation/Gait assistance: Mod assist Ambulation Distance (Feet): 80 Feet Assistive device: Rolling walker (2 wheeled) Gait Pattern/deviations: Step-to pattern, Shuffle, Trunk flexed, Decreased stride length, Decreased dorsiflexion - right, Decreased stance time - right, Ataxic, Wide base of support General Gait Details: Dragging R foot on floor during swing, positions somtimes too far medially, noted with gait out of room L LE ataxic during swing with tremors, but intact placement    Posture / Balance Balance Overall balance assessment: Needs assistance Sitting balance-Leahy Scale: Fair Standing balance support: Bilateral upper extremity supported Standing balance-Leahy Scale: Poor Standing balance comment: UE support for balance with poor positioning at times of R foot, though testing demonstrates intact proprioception,     Special needs/care consideration BiPAP/CPAP  N/a CPM  N/a Continuous Drip IV  N/a Dialysis  N/a Life Vest  N/a Oxygen n/a Special Bed  N/a Trach Size  N/a Wound Vac (area)  N/a Skin surgical incision Bowel mgmt: continent LBM 3/8 Bladder mgmt: continent Diabetic mgmt n/a   Previous Home Environment Living Arrangements: Spouse/significant other  Lives With: Significant other Available Help at Discharge: Available 24 hours/day (fiance states they will arrange supervision as needed) Type of Home: House Home Layout: One level Home Access: Stairs to enter Entrance Stairs-Rails: None Entrance Stairs-Number of Steps: 1 Bathroom  Shower/Tub: Hydrographic surveyor, Engineer, building services: Standard Bathroom Accessibility: Yes How Accessible: Accessible via walker Home Care Services: No Additional Comments: 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 Drove. works out regularly.   Discharge Living Setting Plans for Discharge Living Setting: Patient's home, Lives with (comment) (fiance) Type of Home at Discharge: House Discharge Home Layout: One level Discharge Home Access: Stairs to enter Entrance Stairs-Rails: None Entrance Stairs-Number of Steps: 1 Discharge Bathroom Shower/Tub: Tub/shower unit, Curtain Discharge Bathroom Toilet: Standard Discharge Bathroom Accessibility: Yes How Accessible: Accessible via walker Does the patient have any  problems obtaining your medications?: No  Social/Family/Support Systems Patient Roles: Partner, Parent, Other (Comment) (fulltime employee) Contact Information: Kandis FantasiaFiance, Malvina Anticipated Caregiver: fiance and son Anticipated Caregiver's Contact Information: see above Ability/Limitations of Caregiver: faince and son work but will arrnage as recommended Caregiver Availability: 24/7 Discharge Plan Discussed with Primary Caregiver: Yes Is Caregiver In Agreement with Plan?: Yes Does Caregiver/Family have Issues with Lodging/Transportation while Pt is in Rehab?: No   Pt's fiance is a CNA. His son is his Psychologist, educationaltrainer. They are very hands on in his care. I have directed them not to be getting patient up in his room without the assistance of the rehab staff.   Goals/Additional Needs Patient/Family Goal for Rehab: Mod I with PT, Mod I to supervision OT, Mod I with SLP Expected length of stay: ELOS 10-12 days Pt/Family Agrees to Admission and willing to participate: Yes Program Orientation Provided & Reviewed with Pt/Caregiver Including Roles  & Responsibilities: Yes  Decrease burden of Care through IP rehab admission: n/a  Possible need for SNF placement upon  discharge: not anticipated  Patient Condition: This patient's condition remains as documented in the consult dated 07/05/2016, in which the Rehabilitation Physician determined and documented that the patient's condition is appropriate for intensive rehabilitative care in an inpatient rehabilitation facility. Will admit to inpatient rehab today.  Preadmission Screen Completed By:  Clois DupesBoyette, Samyrah Bruster Godwin, 07/05/2016 1:55 PM ______________________________________________________________________   Discussed status with Dr. Riley KillSwartz on 07/05/2016 at  1354 and received telephone approval for admission today.  Admission Coordinator:  Clois DupesBoyette, Kodi Guerrera Godwin, time [id number] Date 07/05/2016       Cosigned by: Ranelle OysterZachary T Swartz, MD at 07/05/2016 2:05 PM  Revision History

## 2016-07-05 NOTE — Progress Notes (Signed)
I met with pt, his fiance, and son at bedside to discuss a possible inpt rehab admission pending insurance approval. They are all in agreement. We have initiated [facility] approval and await their discission to possibly admit today or Saturday pending insurance authorization. I called and updated Dr. Lonny Prude. RN CM aware. I will update the team later today. [phone]

## 2016-07-05 NOTE — Progress Notes (Signed)
I have insurance approval and bed available to admit pt to inpt rehab today. I have alerted Dr. Caleb PoppNettey as well as RN CM. Pt is in agreement. I will make arrangements to admit pt today. [phone]

## 2016-07-05 NOTE — H&P (Signed)
Physical Medicine and Rehabilitation Admission H&P     Chief Complaint  Patient presents with  . Stroke Symptoms  :  HPI: Darrell Watson is a 56 y.o. right handed male with history of recent L5-S1 fusion anterior approach 06/28/2016. Per chart review patient lives with spouse. 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 One level home with 1 step to entry. Presented 07/03/2016 with right-sided weakness and facial droop with slurred speech. Cranial CT scan negative for acute changes. 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 No associated hemorrhage. MRA negative. Patient did not receive TPA. Echocardiogram with ejection fraction of 50% no wall motion abnormalities. No cardiac source of emboli identified. Carotid Dopplers with no ICA stenosis. Findings of significant decrease in hemoglobin from 15.2-8.3 since recent back surgery. CT abdomen and pelvis showed an intramuscular hematoma of the left rectus muscle measuring 7 x 3.4 x 9 cm in overlying the left psoas 10.2 x 9 x 4.6 cm.Vascular surgery consulted in regards to rectus psoas hematoma advise conservative care. Neurology consulted presently on no anticoagulation due to rectus psoas hematoma. He was placed on subcutaneous Lovenox for DVT prophylaxis 07/03/2016.Venous Doppler studies lower extremities negative. Patient continues with a back brace lumbar corset after recent back surgery applied in sitting position. Physical therapy evaluation completed 07/04/2016 with recommendations of physical medicine rehabilitation consult.Patient was admitted for a comprehensive rehabilitation program  Review of Systems  Constitutional: Negative for chills and fever.  HENT: Negative for hearing loss and tinnitus.  Eyes: Negative for blurred vision and double vision.  Respiratory: Negative for shortness of  breath.  Cardiovascular: Positive for palpitations. Negative for chest pain and leg swelling.  Gastrointestinal: Positive for constipation.  Genitourinary: Positive for urgency.  Musculoskeletal: Positive for myalgias. Negative for falls.  Skin: Negative for rash.  Neurological: Positive for speech change and weakness. Negative for seizures.  All other systems reviewed and are negative.       Past Medical History:  Diagnosis Date  . Anemia   . Arthritis   . Tachycardia         Past Surgical History:  Procedure Laterality Date  . ABDOMINAL EXPOSURE N/A 06/27/2016   Procedure: ABDOMINAL EXPOSURE; Surgeon: Chuck Hint, MD; [facility]; Service: Vascular; Laterality: N/A;  . ANTERIOR LUMBAR FUSION N/A 06/27/2016   Procedure: ANTERIOR LUMBAR FUSION L5-S1; Surgeon: Venita Lick, MD; Location: [facility] OR; Service: Orthopedics; Laterality: N/A; Requests 3 hours  . FINGER AMPUTATION Left   . SHOULDER SURGERY     History reviewed. No pertinent family history.  Social History: reports that he has never smoked. He has never used smokeless tobacco. He reports that he drinks alcohol. He reports that he does not use drugs.  Allergies: No Known Allergies        Medications Prior to Admission  Medication Sig Dispense Refill  . acetaminophen (TYLENOL) 325 MG tablet Take 650 mg by mouth every 6 (six) hours as needed (for pain.).    Marland Kitchen enoxaparin (LOVENOX) 40 MG/0.4ML injection Inject 0.4 mLs (40 mg total) into the skin daily. 10 day supply  1 injection per day 10 Syringe 0  . methocarbamol (ROBAXIN) 500 MG tablet Take 1 tablet (500 mg total) by mouth 3 (three) times daily as needed for muscle spasms. 21 tablet 0  . ondansetron (ZOFRAN) 4 MG tablet Take 1 tablet (4 mg  total) by mouth every 8 (eight) hours as needed for nausea or vomiting. 20 tablet 0  . oxyCODONE-acetaminophen (PERCOCET) 10-325 MG tablet Take 1 tablet by mouth every 4 (four) hours as needed for pain. 42 tablet 0  .  testosterone cypionate (DEPOTESTOSTERONE CYPIONATE) 200 MG/ML injection Inject 300 mg into the muscle every 14 (fourteen) days. 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 Drove. works out regularly.  Lives With: Spouse  Functional History:  Prior Function  Level of Independence: Independent  Functional Status:  Mobility:  Bed Mobility  Overal bed mobility: Needs Assistance  General bed mobility comments: up in chair  Transfers  Overall transfer level: Needs assistance  Equipment used: Rolling walker (2 wheeled)  Transfers: Sit to/from Stand  Sit to Stand: Mod assist, +2 safety/equipment  General transfer comment: lifting assist out of chair due to R foot positioned to L of midline and forward. Family also assisting  Ambulation/Gait  Ambulation/Gait assistance: Mod assist  Ambulation Distance (Feet): 80 Feet  Assistive device: Rolling walker (2 wheeled)  Gait Pattern/deviations: Step-to pattern, Shuffle, Trunk flexed, Decreased stride length, Decreased dorsiflexion - right, Decreased stance time - right, Ataxic, Wide base of support  General Gait Details: Dragging R foot on floor during swing, positions somtimes too far medially, noted with gait out of room L LE ataxic during swing with tremors, but intact placement   ADL:  ADL  Overall ADL's : Needs assistance/impaired  Grooming: Minimal assistance    Upper Body Bathing: Minimal assistance, Sitting  Lower Body Bathing: Minimal assistance, Sit to/from stand  Upper Body Dressing : Moderate assistance  Lower Body Dressing: Moderate assistance, Sit to/from stand  Toilet Transfer: Moderate assistance  Toileting- Clothing Manipulation and Hygiene: Minimal assistance  Functional mobility during ADLs: Moderate assistance  General ADL Comments: Pt with R lateral bias while sitting EOB. Pt able to cross R leg over L knee with difficulty but unable to maintain that position.  Cognition:  Cognition  Overall Cognitive Status: Impaired/Different from baseline  Arousal/Alertness: Suspect due to medications (drowsy from Ativan; per fiance)  Orientation Level: Oriented X4  Attention: Sustained  Sustained Attention: Impaired  Sustained Attention Impairment: Verbal basic  Memory: Impaired  Memory Impairment: Storage deficit, Retrieval deficit, Decreased short term memory  Decreased Short Term Memory: Verbal basic  Awareness: Appears intact  Problem Solving: (not tested, suspect higher level impaired)  Safety/Judgment: Appears intact  Cognition  Arousal/Alertness: Awake/alert  Behavior During Therapy: WFL for tasks assessed/performed  Overall Cognitive Status: Impaired/Different from baseline  Area of Impairment: Attention, Awareness, Problem solving  Current Attention Level: Selective  Awareness: Emergent  Problem Solving: Slow processing, Decreased initiation, Difficulty sequencing  General Comments: will further assess. Pt had 2 falls at home PTA and leaning to R on therapist adn thinks he will be OK to DC home  Physical Exam:  Blood pressure 130/70, pulse (!) 108, temperature 99.1 F (37.3 C), temperature source Oral, resp. rate  18, height 5' 9.5" (1.765 m), weight 88.9 kg (196 lb), SpO2 98 %.  Physical Exam  Vitals reviewed.  Constitutional: He appears well-developed and well-nourished. No distress.  HENT:  Head: Normocephalic.  Eyes:  EOM are normal. Left eye exhibits no discharge.  Neck: Normal range of motion. Neck supple. No tracheal deviation present. No thyromegaly present.  Cardiovascular: Regular rhythm. Exam reveals no friction rub.  No murmur heard.  Respiratory: Effort normal. No respiratory distress. He has no wheezes. He has no rales.  GI: Soft. Bowel sounds are normal. He exhibits no distension.  Skin: No rash noted. He is not diaphoretic. No erythema.  Skin warm and dry  Musculoskeltal.low back incisiontender  Neurological: He is alert.  Makes good eye contact with examiner. Speech is mildly dysarthric. Mild right central 7. Follows full commands. Reasonable insight and awareness. RUE 3- deltoid, 3 to 3+ biceps, triceps, wrist, hand. LUE: 5/5. LLE: 4 to 4+/5 prox to distal. RLE: 4- HF,KE and 4/5 ADF/PF. Senses light touch and senses pain in all 4's. DTR's 3+ bilaterally in LE's  Psych: a bit quiet but overall appropriate  Lab Results Last 48 Hours  Imaging Results (Last 48 hours)     Medical Problem List and Plan:  1. 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  -admit to inpatient rehab  - Back brace when out of bed  2. DVT Prophylaxis/Anticoagulation: Subcutaneous Lovenox. Monitor platelet counts of any signs of bleeding. Venous Doppler study negative  3. Pain Management: Oxycodone and Robaxin as needed  -consider pre-treating prior to therapies  4. Mood: Provide emotional support  5. Neuropsych: This patient is capable of making decisions on his own behalf.  6. Skin/Wound Care: Routine skin checks  7. Fluids/Electrolytes/Nutrition: Routine I&O with follow-up chemistries  8. Acute blood loss anemia due to hematoma/post-op. Follow serial Hgb--9.9 today  9. Constipation. Laxative assistance  Post Admission Physician Evaluation:  1. Functional deficits secondary to recent back surgery and post-op left PLIC and right temporal infarct. 2. Patient is admitted to receive collaborative, interdisciplinary care between the physiatrist, rehab nursing staff, and therapy team. 3. Patient's level of medical complexity and substantial therapy needs in context of that medical necessity cannot be provided at a lesser intensity of care such as a SNF. 4. Patient has experienced substantial functional loss from his/her baseline which was documented above under the "Functional History" and "Functional Status" headings. Judging by the patient's diagnosis, physical exam, and functional history, the patient has potential for functional progress which will result in measurable gains while on inpatient  rehab. These gains will be of substantial and practical use upon discharge in facilitating mobility and self-care at the household level. 5. Physiatrist will provide 24 hour management of medical needs as well as oversight of the therapy plan/treatment and provide guidance as appropriate regarding the interaction of the two. 6. The Preadmission Screening has been reviewed and patient status is unchanged unless otherwise stated above. 7. 24 hour rehab nursing will assist with bladder management, bowel management, safety, skin/wound care, disease management, medication administration, pain management and patient education and help integrate therapy concepts, techniques,education, etc. 8. PT will assess and treat for/with: Lower extremity strength, range of motion, stamina, balance, functional mobility, safety, adaptive techniques and equipment, NMR, pain mgt, back precautions, stroke education, ego support. Goals are: mod I. 9. OT will assess and treat for/with: ADL's, functional mobility, safety, upper extremity strength, adaptive techniques and equipment, NMR, back precautions, stroke education, community reintegration. Goals are: mod I to supervision. Therapy may proceed with showering this patient. 10. SLP will assess and treat for/with: speech, communication, education. Goals are: mod I. 11. Case Management and Social Worker will assess and treat for psychological issues and discharge planning. 12. Team conference will be held weekly to assess progress toward goals and to determine barriers to discharge. 13. Patient will receive at least 3 hours of therapy per day at least 5 days per week. 14. ELOS: 10-15 days  15. Prognosis: excellent   Ranelle Oyster, MD, [facility]  [facility] Physical Medicine & Rehabilitation  07/05/2016  Charlton Amor., PA-C  07/05/2016

## 2016-07-06 ENCOUNTER — Inpatient Hospital Stay (HOSPITAL_COMMUNITY): Payer: 59 | Admitting: Speech Pathology

## 2016-07-06 ENCOUNTER — Inpatient Hospital Stay (HOSPITAL_COMMUNITY): Payer: Self-pay | Admitting: Occupational Therapy

## 2016-07-06 ENCOUNTER — Inpatient Hospital Stay (HOSPITAL_COMMUNITY): Payer: Self-pay | Admitting: Physical Therapy

## 2016-07-06 DIAGNOSIS — S301XXS Contusion of abdominal wall, sequela: Secondary | ICD-10-CM

## 2016-07-06 DIAGNOSIS — I679 Cerebrovascular disease, unspecified: Secondary | ICD-10-CM

## 2016-07-06 DIAGNOSIS — R531 Weakness: Secondary | ICD-10-CM

## 2016-07-06 NOTE — Progress Notes (Signed)
Initial Nutrition Assessment  DOCUMENTATION CODES:  Not applicable  INTERVENTION:  RD spoke with Pharmacy. The buyer for non-formulary products is not on site during weekends. Would need to get approval to obtain. As such, earliest supplement could be received would be early-mid next week.   Re-offer Ensure Enlive PRN  If patient only wants protein, can be offered Prostat or beneprotein powder to add to a liquid beverage.   Ice cream at [employer] and dinner.   NUTRITION DIAGNOSIS:  Inadequate oral intake related to poor appetite as evidenced by meal completion < 25%.  GOAL:  Patient will meet greater than or equal to 90% of their needs   MONITOR:  PO intake, Supplement acceptance, Weight trends, I & O's  REASON FOR ASSESSMENT:  Consult Assessment of nutrition requirement/status  ASSESSMENT:  57 y/o male PMHx Chronic Left sided pain. Presented 3/7 with R side weakness, facial droop and slurred speech. Admitted to inpatient Rehab  Pt has eaten poorly over the last few days (0-10%). He says it is not the food. He does not have any appetite. He denies any n/v/c/d.   Family member thought it could be related to his medications.  RN had stated she felt the pt was depressed.   Family was asking to obtain Ensure High Protein. Apparently this is what he drinks at home when he does not have an appetite.   RD stated that Ensure Enlive was what was on formulary. Family states that the patient can only tolerate the High Protein. The wife, specifically, and says the patient does not need the extra kcals in the [place]. Important to note, the patient did not say he could not have the [facility] and he agreed to ice cream.   RD perceived that the son and wife just didn't want him to have the extra calories.   RD called pharmacy and they were not comfortable with ordering without consulting the departments purchasing head who is not in on weekends. As such, earliest it could be obtained would be next  early/mid next week.   Called RN to communicate this. Recommended re-offering the Ensure Enlive as it has MORE protein. It may have more kcals, but if he is asking for ice cream, do not feel the pt himself is too worried about excess kcals.   NFPE: Patient is very well nourished. Muscular.   Labs: WBC:11.4 Medications: Senna, Ativan,    Recent Labs Lab 07/03/16 1224 07/05/16 1922  NA 138  --   K 3.6  --   CL 98*  --   CO2 30  --   BUN 9  --   CREATININE 1.22 1.16  CALCIUM 8.9  --   GLUCOSE 143*  --    Diet Order:  Diet regular Room service appropriate? Yes; Fluid consistency: Thin  Skin: Surgical wounds to thorax, abdominal binder  Last BM:  3/10  Height:  Ht Readings from Last 1 Encounters:  07/05/16 5\' 10"  (1.778 m)   Weight:  Wt Readings from Last 1 Encounters:  07/05/16 195 lb (88.5 kg)   Wt Readings from Last 10 Encounters:  07/05/16 195 lb (88.5 kg)  07/03/16 196 lb (88.9 kg)  06/27/16 195 lb (88.5 kg)  06/20/16 195 lb 9.6 oz (88.7 kg)  06/19/16 196 lb (88.9 kg)  07/28/12 205 lb 9.6 oz (93.3 kg)  03/05/12 200 lb 12.8 oz (91.1 kg)   Ideal Body Weight:  75.45 kg  BMI:  Body mass index is 27.98 kg/m.  Estimated Nutritional  Needs:  Kcal:  2000-2200 kcals (23-25 kcal/kg) Protein:  75-90 g Pro (1-1.2 g/kg bw) Fluid:  2-2.2 L (231ml/kcal)  EDUCATION NEEDS:  No education needs identified at this time  Darrell LouisNathan Lovie Watson RD, LDN, CNSC Clinical Nutrition Pager: [phone] 07/06/2016 2:42 PM

## 2016-07-06 NOTE — Evaluation (Signed)
Speech Language Pathology Assessment and Plan  Patient Details  Name: Darrell Watson MRN: [id number] Date of Birth: [birth year]  SLP Diagnosis: Cognitive Impairments  Rehab Potential: Excellent ELOS: 10 to 12 days    Today's Date: 07/06/2016 SLP Individual Time: 0240-9735 SLP Individual Time Calculation (min): 60 min   Problem List:  Patient Active Problem List   Diagnosis Date Noted  . Small vessel disease, cerebrovascular 07/05/2016  . Right sided weakness 07/03/2016  . Facial droop 07/03/2016  . Stroke ([place]) 07/03/2016  . Anemia   . Tachycardia   . Back pain 06/27/2016   Past Medical History:  Past Medical History:  Diagnosis Date  . Anemia   . Arthritis   . Tachycardia    Past Surgical History:  Past Surgical History:  Procedure Laterality Date  . ABDOMINAL EXPOSURE N/A 06/27/2016   Procedure: ABDOMINAL EXPOSURE;  Surgeon: Angelia Mould, MD;  Location: [place];  Service: Vascular;  Laterality: N/A;  . ANTERIOR LUMBAR FUSION N/A 06/27/2016   Procedure: ANTERIOR LUMBAR FUSION L5-S1;  Surgeon: Melina Schools, MD;  Location: [place];  Service: Orthopedics;  Laterality: N/A;  Requests 3 hours  . FINGER AMPUTATION Left   . SHOULDER SURGERY      Assessment / Plan / Recommendation Clinical Impression Darrell Watson is a 56 y.o. right handed male with history of recent L5-S1 fusion anterior approach 06/28/2016. Per chart review patient lives with spouse. Worked as a Therapist, music with Production designer, theatre/television/film prior to back surgery and was independent. One level home with 1 step to entry. Presented 07/03/2016 with right-sided weakness and facial droop with slurred speech. Cranial CT scan negative for acute changes. 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 No associated hemorrhage. MRA negative. Patient did not receive TPA. Echocardiogram with ejection fraction of 50% no wall  motion abnormalities. No cardiac source of emboli identified. Carotid Dopplers with no ICA stenosis. Patient continues with a back brace lumbar corset after recent back surgery applied in sitting position. Physical therapy evaluation completed 07/04/2016 with recommendations of physical medicine rehabilitation consult.Patient was admitted for a comprehensive rehabilitation program on 07/05/16.   Cognitive linguistic evaluation completed on 07/06/16. Pt presents with mild cognitive deficits when completing high level tasks. Pt requires Min A and extra time to complete complex tasks and well as selective attention deficits. Pt also present with mild flaccid dysarthria at the simple conversation level. Pt the simple conversation level pt is ~ 90% intelligible. Pt would benefit from skilled ST to increase functional independence and reduce caregiver burden prior to discharge. Unsure if pt will need follow up ST services at discharge.    Skilled Therapeutic Interventions          Skilled treatment session focused on completion of cognitive linguistic evaluation, see above. Pt completed complex calendar management tasks with more than a reasonable amount of time. Pt also presents with mild dysarthria at the simple conversation level for ~90% intelligibility. Education provided to pt and his fiance on goals, plan of care and all voiced agreement and understanding. Pt was returned to room and left with fiance.    SLP Assessment  Patient will need skilled [place] Pathology Services during CIR admission    Recommendations  Recommendations for Other Services: Neuropsych consult Patient destination: Home Follow up Recommendations: Outpatient SLP Equipment Recommended: None recommended by SLP    SLP Frequency 3 to 5 out  of 7 days   SLP Duration  SLP Intensity  SLP Treatment/Interventions 10 to 12 days  Minumum of 1-2 x/day, 30 to 90 minutes  Cognitive remediation/compensation;Environmental  controls;Functional tasks;Medication managment;Patient/family education;Therapeutic Activities;Internal/external aids    Pain Pain Assessment Pain Assessment: No/denies pain  Prior Functioning Cognitive/Linguistic Baseline: Within functional limits Type of Home: House  Lives With: Significant other Available Help at Discharge: Available 24 hours/day Education: 12th grade Vocation: Full time employment  Function:  Eating Eating   Modified Consistency Diet: (P) No Eating Assist Level: (P) Set up assist for   Eating Set Up Assist For: (P) Opening containers;Cutting food       Cognition Comprehension Comprehension assist level: Follows complex conversation/direction with extra time/assistive device  Expression   Expression assist level: Expresses complex ideas: With extra time/assistive device  Social Interaction Social Interaction assist level: Interacts appropriately with others with medication or extra time (anti-anxiety, antidepressant).  Problem Solving Problem solving assist level: Solves complex problems: With extra time  Memory Memory assist level: More than reasonable amount of time   Short Term Goals: Week 1: SLP Short Term Goal 1 (Week 1): Pt will utilize external memory aids for recall of new information with supervision cues.  SLP Short Term Goal 2 (Week 1): Pt will complete complex money management tasks with supervision cues.  SLP Short Term Goal 3 (Week 1): Pt will complete complex medication management tasks with supervision cues.  SLP Short Term Goal 4 (Week 1): Pt will demonstrate selective attention in moderately distracting environment with supervision cues for redirection to tasks.  SLP Short Term Goal 5 (Week 1): Pt will demonstrate anticipatory awareness by listing 3 activites that are safe to participate in at home or work with supervision cues.   Refer to Care Plan for Long Term Goals  Recommendations for other services: Neuropsych  Discharge  Criteria: Patient will be discharged from SLP if patient refuses treatment 3 consecutive times without medical reason, if treatment goals not met, if there is a change in medical status, if patient makes no progress towards goals or if patient is discharged from hospital.  The above assessment, treatment plan, treatment alternatives and goals were discussed and mutually agreed upon: by patient and by family   Thelda Gagan B. Rutherford Nail, M.S., CCC-SLP Speech-Language Pathologist   Nakema Fake [place] 07/06/2016, 12:29 PM

## 2016-07-06 NOTE — Progress Notes (Signed)
Subjective/Complaints:   Objective: Vital Signs: Blood pressure 135/81, pulse (!) 108, temperature 98.4 F (36.9 C), temperature source Oral, resp. rate 18, height 5' 10" (1.778 m), weight 88.5 kg (195 lb), SpO2 100 %. No results found. Results for orders placed or performed during the hospital encounter of 07/05/16 (from the past 72 hour(s))  CBC     Status: Abnormal   Collection Time: 07/05/16  7:22 PM  Result Value Ref Range   WBC 11.4 (H) 4.0 - 10.5 K/uL   RBC 3.83 (L) 4.22 - 5.81 MIL/uL   Hemoglobin 10.5 (L) 13.0 - 17.0 g/dL   HCT 33.3 (L) 39.0 - 52.0 %   MCV 86.9 78.0 - 100.0 fL   MCH 27.4 26.0 - 34.0 pg   MCHC 31.5 30.0 - 36.0 g/dL   RDW 14.5 11.5 - 15.5 %   Platelets 362 150 - 400 K/uL  Creatinine, serum     Status: None   Collection Time: 07/05/16  7:22 PM  Result Value Ref Range   Creatinine, Ser 1.16 0.61 - 1.24 mg/dL   GFR calc non Af Amer >60 >60 mL/min   GFR calc Af Amer >60 >60 mL/min    Comment: (NOTE) The eGFR has been calculated using the CKD EPI equation. This calculation has not been validated in all clinical situations. eGFR's persistently <60 mL/min signify possible Chronic Kidney Disease.      HEENT: normal Cardio: RRR and no murmur Resp: CTA B/L and unlabored GI: BS positive and NT, ND Extremity:  Pulses positive and No Edema Skin:   Intact Neuro: Alert/Oriented, Flat, Normal Sensory, Abnormal Motor 4/5 in Right delt bi, tri, grip HF, KE , ADF, 4/5 L HF, grip, 5/5 in remaining muscle groups on Left side and Abnormal FMC Ataxic/ dec FMC Musc/Skel:  Other PIP amp digits 3,4,5 Gen NAD   Assessment/Plan: 1. Functional deficits secondary to embolic L PLIC/temperoal/opercular infarct, lumbar fusion 0/9/32 complicated by L Psoas hematoma which require 3+ hours per day of interdisciplinary therapy in a comprehensive inpatient rehab setting. Physiatrist is providing close team supervision and 24 hour management of active medical problems listed  below. Physiatrist and rehab team continue to assess barriers to discharge/monitor patient progress toward functional and medical goals. FIM:       Function - Toileting Toileting steps completed by patient: Performs perineal hygiene Toileting steps completed by helper: Adjust clothing prior to toileting, Adjust clothing after toileting Toileting Assistive Devices: Grab bar or rail  Function - Air cabin crew transfer assistive device: Grab bar Assist level to toilet: Moderate assist (Pt 50 - 74%/lift or lower) Assist level from toilet: Moderate assist (Pt 50 - 74%/lift or lower)        Function - Comprehension Comprehension: Auditory Comprehension assist level: Follows complex conversation/direction with no assist  Function - Expression Expression: Verbal Expression assist level: Expresses complex ideas: With no assist  Function - Social Interaction Social Interaction assist level: Interacts appropriately with others with medication or extra time (anti-anxiety, antidepressant).  Function - Problem Solving Problem solving assist level: Solves complex problems: With extra time  Function - Memory Memory assist level: Complete Independence: No helper Patient normally able to recall (first 3 days only): Current season, Location of own room, Staff names and faces, That he or she is in a hospital  1. 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  CIR  PT, OT, SLP - Back brace when out of bed - mild L hip flexor weakness from psoas hematoma 2. DVT Prophylaxis/Anticoagulation: Subcutaneous Lovenox. Monitor platelet counts (362K on 3/9)  of any signs of bleeding. Venous Doppler study negative  3. Pain Management: Oxycodone and Robaxin as needed  -consider pre-treating prior to therapies  4. Mood:  Provide emotional support  5. Neuropsych: This patient is capable of making decisions on his own behalf.  6. Skin/Wound Care: Routine skin checks  7. Fluids/Electrolytes/Nutrition: Routine I&O with follow-up chemistries  8. Acute blood loss anemia due to hematoma/post-op. Follow serial Hgb--9.9 > 10.5 on 3/9 9. Constipation. Laxative assistance   LOS (Days) 1 A FACE TO FACE EVALUATION WAS PERFORMED  , E 07/06/2016, 7:15 AM

## 2016-07-06 NOTE — Evaluation (Signed)
Occupational Therapy Assessment and Plan  Patient Details  Name: Darrell Watson MRN: [id number] Date of Birth: Sep 25, 1960  OT Diagnosis: abnormal posture, ataxia, cognitive deficits and hemiplegia affecting non-dominant side Rehab Potential: Rehab Potential (ACUTE ONLY): Excellent ELOS: 14-16 days         Problem List:  Patient Active Problem List   Diagnosis Date Noted  . Small vessel disease, cerebrovascular 07/05/2016  . Right sided weakness 07/03/2016  . Facial droop 07/03/2016  . Stroke ([place]) 07/03/2016  . Anemia   . Tachycardia   . Back pain 06/27/2016    Past Medical History:  Past Medical History:  Diagnosis Date  . Anemia   . Arthritis   . Tachycardia    Past Surgical History:  Past Surgical History:  Procedure Laterality Date  . ABDOMINAL EXPOSURE N/A 06/27/2016   Procedure: ABDOMINAL EXPOSURE;  Surgeon: Angelia Mould, MD;  Location: [place];  Service: Vascular;  Laterality: N/A;  . ANTERIOR LUMBAR FUSION N/A 06/27/2016   Procedure: ANTERIOR LUMBAR FUSION L5-S1;  Surgeon: Melina Schools, MD;  Location: [facility];  Service: Orthopedics;  Laterality: N/A;  Requests 3 hours  . FINGER AMPUTATION Left   . SHOULDER SURGERY      Assessment & Plan Clinical Impression: Darrell Watson is a 56 y.o. right handed male with history of recent L5-S1 fusion anterior approach 06/28/2016. Per chart review patient lives with spouse. Worked as a Therapist, music with Production designer, theatre/television/film prior to back surgery and was independent. One level home with 1 step to entry. Presented 07/03/2016 with right-sided weakness and facial droop with slurred speech. Cranial CT scan negative for acute changes. 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 No associated hemorrhage. MRA negative. Patient did not receive TPA. Echocardiogram with ejection fraction of 50% no wall motion abnormalities. No cardiac  source of emboli identified. Carotid Dopplers with no ICA stenosis. Findings of significant decrease in hemoglobin from 15.2-8.3 since recent back surgery. CT abdomen and pelvis showed an intramuscular hematoma of the left rectus muscle measuring 7 x 3.4 x 9 cm in overlying the left psoas 10.2 x 9 x 4.6 cm.Vascular surgery consulted in regards to rectus psoas hematoma advise conservative care. Neurology consulted presently on no anticoagulation due to rectus psoas hematoma. He was placed on subcutaneous Lovenox for DVT prophylaxis 07/03/2016.Venous Doppler studies lower extremities negative. Patient continues with a back brace lumbar corset after recent back surgery applied in sitting position. Physical therapy evaluation completed 07/04/2016 with recommendations of physical medicine rehabilitation consult.Patient was admitted for a comprehensive rehabilitation program   Patient currently requires mod with basic self-care skills secondary to muscle weakness, decreased cardiorespiratoy endurance, decreased safety awareness and decreased memory and decreased standing balance, decreased postural control, hemiplegia and difficulty maintaining precautions.  Prior to hospitalization, patient could complete BADLs with independent .  Patient will benefit from skilled intervention to increase independence with basic self-care skills prior to discharge home with care partner.  Anticipate patient will require intermittent supervision and follow up home health and follow up outpatient.  OT - End of Session Endurance Deficit: Yes OT Assessment Rehab Potential (ACUTE ONLY): Excellent Barriers to Discharge: Decreased caregiver support OT Patient demonstrates impairments in the following area(s): Balance;Safety;Cognition;Endurance;Motor OT Basic ADL's Functional Problem(s): Grooming;Bathing;Dressing;Toileting OT Transfers Functional Problem(s): Toilet;Tub/Shower OT Additional Impairment(s): Fuctional Use of Upper  Extremity OT Plan OT Intensity: Minimum of 1-2 x/day, 45 to 90  minutes OT Frequency: 5 out of 7 days OT Duration/Estimated Length of Stay: 14-16 days OT Treatment/Interventions: Discharge planning;Self Care/advanced ADL retraining;Therapeutic Activities;UE/LE Coordination activities;Cognitive remediation/compensation;Functional mobility training;Patient/family education;Therapeutic Exercise;DME/adaptive equipment instruction;Psychosocial support;UE/LE Strength taining/ROM;Wheelchair propulsion/positioning OT Self Feeding Anticipated Outcome(s): N/A OT Basic Self-Care Anticipated Outcome(s): Supervision-Mod I  OT Toileting Anticipated Outcome(s): Mod I  OT Bathroom Transfers Anticipated Outcome(s): Supervision-Mod I  OT Recommendation Recommendations for Other Services: Therapeutic Recreation consult Therapeutic Recreation Interventions: Pet therapy;Kitchen group Patient destination: Home Follow Up Recommendations: Home health OT;Outpatient OT Equipment Recommended: 3 in 1 bedside comode;Tub/shower bench;To be determined   Skilled Therapeutic Intervention Skilled OT session completed with focus on initial evaluation, education on OT role/POC, adherence to precautions, and establishment of patient-centered goals. Pt was sitting in w/c with family present at time of arrival, agreeable to session. No c/o pain. Back brace donned with Min A, pt able to verbalize 3/3 precautions. Stand pivot transfer completed to shower with Mod A and use of grab bars. IV and lower stomach bandage covered. Pt completed bathing while laterally leaning, required mod cues to not stand or bend past 90 degrees. With extra time, pt able to utilize figure 4 position for washing feet. Afterwards pt donned shirt, orthosis, underwear and footwear while sitting on tub bench, LEs facing outwards on edge of bench for extra room. Tremor/spastic activity observed in LEs, which pt reports to have been occurring prior to back surgery. He  completed transfer back to w/c in manner as written above, right LE "sticking" beside L LE and pt losing balance with therapist correction. Pt reported afterwards that he was unaware that he had lost his balance. He donned his pants w/c level at sink with Mod A for balance during clothing mgt, pt leaning towards right side in standing and required cues to acknowledge. Afterwards pt was set up for lunch with friend present and educated him on setting pt up so that call bell was within reach when he left.   OT Evaluation Precautions/Restrictions  Precautions Precautions: Fall;Back Precaution Comments: able to report 3/3 precautions Required Braces or Orthoses: Spinal Brace Spinal Brace: Lumbar corset;Applied in sitting position Restrictions Weight Bearing Restrictions: No General Chart Reviewed: Yes Family/Caregiver Present: No Vital Signs  Pain No c/o pain during session    Home Living/Prior Functioning Home Living Available Help at Discharge: Available PRN/intermittently Type of Home: House Home Access: Stairs to enter [employer] of Steps: 1 Entrance Stairs-Rails: None Home Layout: One level Bathroom Shower/Tub: Tub/shower unit, Architectural technologist: Standard Bathroom Accessibility: Yes Additional Comments: Pt worked as Therapist, music with Production designer, theatre/television/film. Drove. works out regularly lifting [employer]   Lives With: Significant other IADL History Homemaking Responsibilities: Yes Meal Prep Responsibility: Building surveyor Responsibility: No Cleaning Responsibility: No Education: 12th grade Occupation: Full time employment Type of Occupation: Therapist, music with Production designer, theatre/television/film  Leisure and Hobbies: Working [employer], taking care of his dog Prior Function Level of Independence: Independent with basic ADLs, Independent with gait, Independent with transfers  Able to Take Stairs?: Yes Driving: Yes Vocation: Full time  employment Leisure: Hobbies-yes (Comment) Comments: working out, Journalist, newspaper 5x/week ADL ADL ADL Comments: Please see functional navigator for ADL status Vision/Perception  Vision- History Patient Visual Report: No change from baseline Vision- Assessment Vision Assessment?: No apparent visual deficits  Cognition Overall Cognitive Status: Impaired/Different from baseline Arousal/Alertness: Awake/alert Orientation Level: Person;Place;Situation Person: Oriented Place: Oriented Situation: Oriented Year: 2018 Month: March Day of Week: Correct Memory: Impaired Memory Impairment: Decreased recall  of new information Immediate Memory Recall: Sock;Blue;Bed Memory Recall: Blue;Sock;Bed Memory Recall Sock: With Cue Memory Recall Blue: Without Cue Memory Recall Bed: With Cue Attention: Sustained Sustained Attention: Impaired Awareness: Appears intact Problem Solving: Impaired Safety/Judgment: Appears intact Sensation Sensation Light Touch: Appears Intact Hot/Cold: Appears Intact Proprioception: Appears Intact Coordination Gross Motor Movements are Fluid and Coordinated: No Fine Motor Movements are Fluid and Coordinated: No Coordination and Movement Description: R UE FMC deficits, Ataxic LE movement, tremor/spastic L LE activity wihile engaged in seated LB dressing tasks (pt reports this is baseline) Finger Nose Finger Test: R UE dysmetria  Motor  Motor Motor: Ataxia;Hemiplegia Mobility  Transfers Transfers: Sit to Stand;Stand to Sit Sit to Stand: 3: Mod assist Sit to Stand Details: Verbal cues for technique;Verbal cues for precautions/safety;Manual facilitation for weight shifting Stand to Sit: 3: Mod assist Stand to Sit Details (indicate cue type and reason): Manual facilitation for weight shifting;Verbal cues for technique;Verbal cues for precautions/safety  Trunk/Postural Assessment  Cervical Assessment Cervical Assessment: Within Functional Limits Thoracic  Assessment Thoracic Assessment: Within Functional Limits Lumbar Assessment Lumbar Assessment: Within Functional Limits Postural Control Postural Control: Deficits on evaluation (Right lean in sitting/standing)  Balance Balance Balance Assessed: Yes Dynamic Sitting Balance Dynamic Sitting - Balance Support: Right upper extremity supported Dynamic Sitting - Level of Assistance: 5: Stand by assistance (while bathing on tub bench) Dynamic Standing Balance Dynamic Standing - Balance Support: Right upper extremity supported Dynamic Standing - Level of Assistance: 3: Mod assist Dynamic Standing - Balance Activities:  (LB dressing) Extremity/Trunk Assessment RUE Assessment RUE Assessment: Exceptions to [facility] (4/5 gross strength ) LUE Assessment LUE Assessment: Within Functional Limits (limited shoulder flexion due to past shoulder surgery )   See Function Navigator for Current Functional Status.   Refer to Care Plan for Long Term Goals  Recommendations for other services: Therapeutic Recreation  Pet therapy, Kitchen group and Outing/community reintegration   Discharge Criteria: Patient will be discharged from OT if patient refuses treatment 3 consecutive times without medical reason, if treatment goals not met, if there is a change in medical status, if patient makes no progress towards goals or if patient is discharged from hospital.  The above assessment, treatment plan, treatment alternatives and goals were discussed and mutually agreed upon: by patient  Skeet Simmer 07/06/2016, 9:36 PM

## 2016-07-06 NOTE — Evaluation (Signed)
Physical Therapy Assessment and Plan  Patient Details  Name: Darrell Watson MRN: [id number] Date of Birth: April 01, 1961  PT Diagnosis: Abnormality of gait, Cognitive deficits, Coordination disorder, Difficulty walking, Hemiparesis non-dominant, Hypertonia, Impaired cognition, Low back pain, Muscle spasms, Muscle weakness and Pain in back Rehab Potential: Good ELOS: 14-16 days   Today's Date: 07/06/2016 PT Individual Time: 1300-1415 PT Individual Time Calculation (min): 75 min    Problem List:  Patient Active Problem List   Diagnosis Date Noted  . Small vessel disease, cerebrovascular 07/05/2016  . Right sided weakness 07/03/2016  . Facial droop 07/03/2016  . Stroke ([place]) 07/03/2016  . Anemia   . Tachycardia   . Back pain 06/27/2016    Past Medical History:  Past Medical History:  Diagnosis Date  . Anemia   . Arthritis   . Tachycardia    Past Surgical History:  Past Surgical History:  Procedure Laterality Date  . ABDOMINAL EXPOSURE N/A 06/27/2016   Procedure: ABDOMINAL EXPOSURE;  Surgeon: Angelia Mould, MD;  Location: [facility];  Service: Vascular;  Laterality: N/A;  . ANTERIOR LUMBAR FUSION N/A 06/27/2016   Procedure: ANTERIOR LUMBAR FUSION L5-S1;  Surgeon: Melina Schools, MD;  Location: [facility];  Service: Orthopedics;  Laterality: N/A;  Requests 3 hours  . FINGER AMPUTATION Left   . SHOULDER SURGERY      Assessment & Plan Clinical Impression: Darrell Watson is a 56 y.o. right handed male with history of recent L5-S1 fusion anterior approach 06/28/2016. Per chart review patient lives with spouse. Worked as a Therapist, music with Production designer, theatre/television/film prior to back surgery and was independent. One level home with 1 step to entry. Presented 07/03/2016 with right-sided weakness and facial droop with slurred speech. Cranial CT scan negative for acute changes. MRI of the brain showed small acute infarct within the left posterior limb of internal capsule and punctate acute infarct  right temporal operculum. No associated hemorrhage. MRA negative. Patient did not receive TPA. Echocardiogram with ejection fraction of 50% no wall motion abnormalities. No cardiac source of emboli identified. Carotid Dopplers with no ICA stenosis. Findings of significant decrease in hemoglobin from 15.2-8.3 since recent back surgery. CT abdomen and pelvis showed an intramuscular hematoma of the left rectus muscle measuring 7 x 3.4 x 9 cm in overlying the left psoas 10.2 x 9 x 4.6 cm.Vascular surgery consulted in regards to rectus psoas hematoma advise conservative care. Neurology consulted presently on no anticoagulation due to rectus psoas hematoma. He was placed on subcutaneous Lovenox for DVT prophylaxis 07/03/2016.Venous Doppler studies lower extremities negative. Patient continues with a back brace lumbar corset after recent back surgery applied in sitting position. Physical therapy evaluation completed 07/04/2016 with recommendations of physical medicine rehabilitation consult.Patient was admitted for a comprehensive rehabilitation program. Patient transferred to CIR on 07/05/2016 .   Patient currently requires mod with mobility secondary to muscle weakness, decreased cardiorespiratoy endurance, impaired timing and sequencing, abnormal tone, unbalanced muscle activation, ataxia and decreased coordination and decreased sitting balance, decreased standing balance, decreased postural control, decreased balance strategies and difficulty maintaining precautions.  Prior to hospitalization, patient was independent  with mobility and lived with Significant other in a House home.  Home access is 1Stairs to enter.  Patient will benefit from skilled PT intervention to maximize safe functional mobility, minimize fall risk and decrease caregiver burden for planned discharge home with intermittent assist.  Anticipate patient will benefit from follow up [facility] at discharge.  PT - End of Session Activity  Tolerance:  Tolerates 30+ min activity with multiple rests Endurance Deficit: Yes Endurance Deficit Description: requires seated rest break after short duration mobility tasks PT Assessment Rehab Potential (ACUTE/IP ONLY): Good Barriers to Discharge: Decreased caregiver support PT Patient demonstrates impairments in the following area(s): Balance;Endurance;Motor;Safety;Pain PT Transfers Functional Problem(s): Bed Mobility;Bed to Chair;Car;Furniture PT Locomotion Functional Problem(s): Ambulation;Wheelchair Mobility;Stairs PT Plan PT Intensity: Minimum of 1-2 x/day ,45 to 90 minutes PT Frequency: 5 out of 7 days PT Duration Estimated Length of Stay: 14-16 days PT Treatment/Interventions: Ambulation/gait training;Balance/vestibular training;Cognitive remediation/compensation;Community reintegration;DME/adaptive equipment instruction;Pain management;Patient/family education;Stair training;UE/LE Coordination activities;UE/LE Strength taining/ROM;Splinting/orthotics;Therapeutic Exercise;Wheelchair propulsion/positioning;Therapeutic Activities;Psychosocial support;Functional mobility training;Discharge planning;Disease management/prevention;Neuromuscular re-education PT Transfers Anticipated Outcome(s): modI PT Locomotion Anticipated Outcome(s): S gait LRAD PT Recommendation Recommendations for Other Services: Therapeutic Recreation consult Therapeutic Recreation Interventions: Outing/community reintergration Follow Up Recommendations: Home health PT Patient destination: Home Equipment Recommended: To be determined Equipment Details: has quad cane  Skilled Therapeutic Intervention Pt received supine in bed, denies pain and agreeable to treatment. PT initial evaluation performed and completed with modA overall as described below d/t R hemiparesis, tremors LLE with activity, and pain from recent surgery. Educated pt in rehab process, goals, estimated LOS, falls prevention safety. Remained seated in w/c at end  of session with family arriving as therapist leaving, all needs in reach.   PT Evaluation Precautions/Restrictions Restrictions Weight Bearing Restrictions: No General Chart Reviewed: Yes Response to Previous Treatment: Patient reporting fatigue but able to participate. Family/Caregiver Present: No  Pain Pain Assessment Pain Assessment: No/denies pain Home Living/Prior Functioning Home Living Available Help at Discharge: Available PRN/intermittently (per pt his fiance works during the day, has family that can drop by to check on him. Per previous report family has reported they can provide 24/7) Type of Home: House Home Access: Stairs to enter [employer] of Steps: 1 Entrance Stairs-Rails: None Home Layout: One level Additional Comments: Pt worked as Therapist, music with Production designer, theatre/television/film. Drove. works out regularly lifting [employer]   Lives With: Significant other Prior Function Level of Independence: Independent with basic ADLs;Independent with gait;Independent with transfers;Independent with homemaking with ambulation  Able to Take Stairs?: Yes Driving: Yes Vocation: Full time employment Leisure: Hobbies-yes (Comment) Comments: working out, lifting weights 5x/week Vision/Perception  Vision - Assessment Eye Alignment: Within Functional Limits Ocular Range of Motion: Within Functional Limits Saccades: Within functional limits Convergence: Within functional limits  Cognition Orientation Level: Oriented X4 Sensation Sensation Light Touch: Appears Intact Stereognosis: Not tested Hot/Cold: Not tested Proprioception: Appears Intact Coordination Gross Motor Movements are Fluid and Coordinated: No Fine Motor Movements are Fluid and Coordinated: No Finger Nose Finger Test: dysmetric RUE>LUE Heel Shin Test: impaired RLE, WFL LLE Motor  Motor Motor: Ataxia;Hemiplegia Motor - Skilled Clinical Observations: RLE 7 beat clonus, LLE 3 beat clonus, R hemiparesis   Mobility Bed Mobility Bed Mobility: Sit to Supine;Supine to Sit Supine to Sit: 5: Supervision Supine to Sit Details: Verbal cues for technique;Verbal cues for precautions/safety Sit to Supine: 5: Supervision Sit to Supine - Details: Verbal cues for technique;Verbal cues for precautions/safety Transfers Transfers: Yes Sit to Stand: 3: Mod assist Sit to Stand Details: Verbal cues for technique;Verbal cues for precautions/safety Stand to Sit: 3: Mod assist Stand to Sit Details (indicate cue type and reason): Verbal cues for technique;Verbal cues for precautions/safety Stand Pivot Transfers: 3: Mod assist;With armrests Stand Pivot Transfer Details: Verbal cues for technique;Verbal cues for precautions/safety;Tactile cues for weight beaing;Tactile cues for placement;Tactile cues for posture;Verbal cues for gait pattern Stand Pivot  Transfer Details (indicate cue type and reason): with RW Locomotion  Ambulation Ambulation: Yes Ambulation/Gait Assistance: 3: Mod assist;4: Min assist Ambulation Distance (Feet): 150 Feet Assistive device: Rolling walker Ambulation/Gait Assistance Details: Verbal cues for gait pattern;Verbal cues for sequencing;Verbal cues for technique;Tactile cues for weight shifting Ambulation/Gait Assistance Details: modA initially, decreased to minA/min guard with repetition Gait Gait: Yes Gait Pattern: Impaired Gait Pattern: Scissoring;Lateral hip instability;Poor foot clearance - right;Right genu recurvatum;Decreased stride length Gait velocity: Decreased Stairs / Additional Locomotion Stairs: Yes Stairs Assistance: 4: Min assist Stairs Assistance Details: Verbal cues for gait pattern;Verbal cues for sequencing;Verbal cues for technique;Tactile cues for weight shifting Stairs Assistance Details (indicate cue type and reason): assist for L weight shift for R foot clearance Stair Management Technique: Two rails;Alternating pattern;Forwards Number of Stairs: 12 Height  of Stairs: 3 (3", 6" corner stairs) Ramp: 4: Min Chemical engineer: Yes Wheelchair Assistance: 4: Min Tour manager: Both upper extremities Wheelchair Parts Management: Needs assistance Distance: 150'  Trunk/Postural Assessment  Cervical Assessment Cervical Assessment: Within Functional Limits Thoracic Assessment Thoracic Assessment: Within Functional Limits Lumbar Assessment Lumbar Assessment: Within Functional Limits Postural Control Postural Control: Deficits on evaluation (decreased midline orientation, delayed stepping/righting reactions)  Balance Balance Balance Assessed: Yes Standardized Balance Assessment Standardized Balance Assessment: Timed Up and Go Test Timed Up and Go Test TUG: Normal TUG Normal TUG (seconds): 49 Static Sitting Balance Static Sitting - Balance Support: Feet supported;No upper extremity supported Static Sitting - Level of Assistance: 5: Stand by assistance Dynamic Sitting Balance Dynamic Sitting - Balance Support: No upper extremity supported;Feet supported;During functional activity Dynamic Sitting - Level of Assistance: 4: Min assist Dynamic Sitting Balance - Compensations: LOB to R side while attempting to put brace on sitting EOB Dynamic Sitting - Balance Activities: Reaching for objects;Trunk control activities Static Standing Balance Static Standing - Balance Support: Bilateral upper extremity supported Static Standing - Level of Assistance: 5: Stand by assistance Dynamic Standing Balance Dynamic Standing - Balance Support: Bilateral upper extremity supported;During functional activity Dynamic Standing - Level of Assistance: 3: Mod assist Extremity Assessment  RUE Assessment RUE Assessment: Exceptions to [facility] (deficits d/t hemiparesis; defer to OT exam) LUE Assessment LUE Assessment: Within Functional Limits RLE Assessment RLE Assessment: Exceptions to [facility] RLE AROM (degrees) Overall AROM Right  Lower Extremity: Within functional limits for tasks assessed RLE Strength RLE Overall Strength: Deficits Right Hip Flexion: 4-/5 Right Hip ABduction: 4-/5 Right Hip ADduction: 4/5 Right Knee Flexion: 4/5 Right Knee Extension: 4/5 Right Ankle Dorsiflexion: 4/5 Right Ankle Plantar Flexion: 4/5 LLE Assessment LLE Assessment: Within Functional Limits   See Function Navigator for Current Functional Status.   Refer to Care Plan for Long Term Goals  Recommendations for other services: Neuropsych and Therapeutic Recreation  Pet therapy, Kitchen group, Stress management and Outing/community reintegration  Discharge Criteria: Patient will be discharged from PT if patient refuses treatment 3 consecutive times without medical reason, if treatment goals not met, if there is a change in medical status, if patient makes no progress towards goals or if patient is discharged from hospital.  The above assessment, treatment plan, treatment alternatives and goals were discussed and mutually agreed upon: by patient  Luberta Mutter 07/06/2016, 1:43 PM

## 2016-07-07 DIAGNOSIS — S344XXD Injury of lumbosacral plexus, subsequent encounter: Secondary | ICD-10-CM

## 2016-07-07 NOTE — Progress Notes (Addendum)
Subjective/Complaints: No issues in therapy, feels weak in LLE and RLE  ROS- - CP/SOB, N/V/D  Objective: Vital Signs: Blood pressure 129/67, pulse (!) 105, temperature 98.6 F (37 C), temperature source Oral, resp. rate 18, height '5\' 10"'$  (1.778 m), weight 88.5 kg (195 lb), SpO2 100 %. No results found. Results for orders placed or performed during the hospital encounter of 07/05/16 (from the past 72 hour(s))  CBC     Status: Abnormal   Collection Time: 07/05/16  7:22 PM  Result Value Ref Range   WBC 11.4 (H) 4.0 - 10.5 K/uL   RBC 3.83 (L) 4.22 - 5.81 MIL/uL   Hemoglobin 10.5 (L) 13.0 - 17.0 g/dL   HCT 33.3 (L) 39.0 - 52.0 %   MCV 86.9 78.0 - 100.0 fL   MCH 27.4 26.0 - 34.0 pg   MCHC 31.5 30.0 - 36.0 g/dL   RDW 14.5 11.5 - 15.5 %   Platelets 362 150 - 400 K/uL  Creatinine, serum     Status: None   Collection Time: 07/05/16  7:22 PM  Result Value Ref Range   Creatinine, Ser 1.16 0.61 - 1.24 mg/dL   GFR calc non Af Amer >60 >60 mL/min   GFR calc Af Amer >60 >60 mL/min    Comment: (NOTE) The eGFR has been calculated using the CKD EPI equation. This calculation has not been validated in all clinical situations. eGFR's persistently <60 mL/min signify possible Chronic Kidney Disease.      HEENT: normal Cardio: RRR and no murmur Resp: CTA B/L and unlabored GI: BS positive and NT, ND, abd incision with post op dressing Extremity:  Pulses positive and No Edema Skin:   Intact Neuro: Alert/Oriented, Flat, Normal Sensory, Abnormal Motor 4/5 in Right delt bi, tri, grip 3- HF,4- KE ,3- ADF, 4/5 L HF, grip, 5/5 in remaining muscle groups on Left side and Abnormal FMC Ataxic/ dec FMC Musc/Skel:  Other PIP amp digits 3,4,5 Gen NAD   Assessment/Plan: 1. Functional deficits secondary to embolic L PLIC/temperoal/opercular infarct, lumbar fusion 05/07/27 complicated by L Psoas hematoma which require 3+ hours per day of interdisciplinary therapy in a comprehensive inpatient rehab  setting. Physiatrist is providing close team supervision and 24 hour management of active medical problems listed below. Physiatrist and rehab team continue to assess barriers to discharge/monitor patient progress toward functional and medical goals. FIM: Function - Bathing Position: Shower Body parts bathed by patient: Right arm, Left arm, Chest, Abdomen, Front perineal area, Buttocks, Right upper leg, Left upper leg, Right lower leg, Left lower leg Body parts bathed by helper: Back Assist Level: Touching or steadying assistance(Pt > 75%)  Function- Upper Body Dressing/Undressing What is the patient wearing?: Orthosis Pull over shirt/dress - Perfomed by patient: Thread/unthread right sleeve, Thread/unthread left sleeve, Put head through opening, Pull shirt over trunk Orthosis activity level: Performed by patient Assist Level: Supervision or verbal cues Function - Lower Body Dressing/Undressing What is the patient wearing?: Underwear, Pants, Non-skid slipper socks Underwear - Performed by patient: Thread/unthread right underwear leg, Thread/unthread left underwear leg, Pull underwear up/down Pants- Performed by patient: Thread/unthread right pants leg, Thread/unthread left pants leg, Pull pants up/down Non-skid slipper socks- Performed by helper: Don/doff right sock, Don/doff left sock Assist for footwear: Maximal assist Assist for lower body dressing: Touching or steadying assistance (Pt > 75%)  Function - Toileting Toileting steps completed by patient: Performs perineal hygiene Toileting steps completed by helper: Adjust clothing prior to toileting, Adjust clothing after toileting Toileting Assistive  Devices: Grab bar or rail Assist level: Touching or steadying assistance (Pt.75%)  Function - Air cabin crew transfer assistive device: Grab bar, Elevated toilet seat/BSC over toilet Assist level to toilet: Moderate assist (Pt 50 - 74%/lift or lower) Assist level from toilet:  Moderate assist (Pt 50 - 74%/lift or lower)  Function - Chair/bed transfer Chair/bed transfer method: Stand pivot Chair/bed transfer assist level: Moderate assist (Pt 50 - 74%/lift or lower) Chair/bed transfer assistive device: Armrests Chair/bed transfer details: Manual facilitation for weight shifting, Verbal cues for precautions/safety, Verbal cues for technique, Manual facilitation for placement  Function - Locomotion: Wheelchair Will patient use wheelchair at discharge?: No Type: Manual Max wheelchair distance: 150 Assist Level: Touching or steadying assistance (Pt > 75%) Assist Level: Touching or steadying assistance (Pt > 75%) Assist Level: Touching or steadying assistance (Pt > 75%) Turns around,maneuvers to table,bed, and toilet,negotiates 3% grade,maneuvers on rugs and over doorsills: No Function - Locomotion: Ambulation Assistive device: Walker-rolling Max distance: 150 Assist level: Moderate assist (Pt 50 - 74%) Assist level: Moderate assist (Pt 50 - 74%) Assist level: Moderate assist (Pt 50 - 74%) Assist level: Moderate assist (Pt 50 - 74%) Assist level: Moderate assist (Pt 50 - 74%)  Function - Comprehension Comprehension: Auditory Comprehension assist level: Follows complex conversation/direction with extra time/assistive device  Function - Expression Expression: Verbal Expression assist level: Expresses complex ideas: With no assist  Function - Social Interaction Social Interaction assist level: Interacts appropriately with others with medication or extra time (anti-anxiety, antidepressant).  Function - Problem Solving Problem solving assist level: Solves complex problems: With extra time  Function - Memory Memory assist level: More than reasonable amount of time Patient normally able to recall (first 3 days only): Current season, Location of own room, Staff names and faces, That he or she is in a hospital  1. Right hemiplegia with dysarthria secondary to  embolic left posterior limb internal capsule/right temporal/operculum infarct with recent lumbar fusion 56/21/3086 complicated by intramuscular hematoma of the left rectus muscle.  CIR PT, OT, SLP- tolerated therapy well - Back brace when out of bed - mild L hip flexor weakness from psoas hematoma, lumbar plexopathy 2. DVT Prophylaxis/Anticoagulation: Subcutaneous Lovenox. Monitor platelet counts (362K on 3/9)  of any signs of bleeding. Venous Doppler study negative  3. Pain Management: Oxycodone and Robaxin as needed  -consider pre-treating prior to therapies  4. Mood: Provide emotional support  5. Neuropsych: This patient is capable of making decisions on his own behalf.  6. Skin/Wound Care: Routine skin checks  7. Fluids/Electrolytes/Nutrition: Routine I&O with follow-up chemistries CMET in am 8. Acute blood loss anemia due to hematoma/post-op. Follow serial Hgb--9.9 > 10.5 on 3/9, recheck in am 9. Constipation. Laxative assistance   LOS (Days) 2 A FACE TO FACE EVALUATION WAS PERFORMED  KIRSTEINS,ANDREW E 07/07/2016, 7:10 AM

## 2016-07-08 ENCOUNTER — Inpatient Hospital Stay (HOSPITAL_COMMUNITY): Payer: 59 | Admitting: Speech Pathology

## 2016-07-08 ENCOUNTER — Inpatient Hospital Stay (HOSPITAL_COMMUNITY): Payer: 59 | Admitting: Physical Therapy

## 2016-07-08 ENCOUNTER — Inpatient Hospital Stay (HOSPITAL_COMMUNITY): Payer: 59 | Admitting: Occupational Therapy

## 2016-07-08 LAB — CBC WITH DIFFERENTIAL/PLATELET
BASOS PCT: 0 %
Basophils Absolute: 0 10*3/uL (ref 0.0–0.1)
EOS PCT: 5 %
Eosinophils Absolute: 0.5 10*3/uL (ref 0.0–0.7)
HCT: 33.1 % — ABNORMAL LOW (ref 39.0–52.0)
Hemoglobin: 10.5 g/dL — ABNORMAL LOW (ref 13.0–17.0)
LYMPHS ABS: 1.2 10*3/uL (ref 0.7–4.0)
Lymphocytes Relative: 12 %
MCH: 27.8 pg (ref 26.0–34.0)
MCHC: 31.7 g/dL (ref 30.0–36.0)
MCV: 87.6 fL (ref 78.0–100.0)
MONO ABS: 1.1 10*3/uL — AB (ref 0.1–1.0)
MONOS PCT: 11 %
NEUTROS PCT: 72 %
Neutro Abs: 7.4 10*3/uL (ref 1.7–7.7)
Platelets: 376 10*3/uL (ref 150–400)
RBC: 3.78 MIL/uL — ABNORMAL LOW (ref 4.22–5.81)
RDW: 14.6 % (ref 11.5–15.5)
WBC: 10.2 10*3/uL (ref 4.0–10.5)

## 2016-07-08 LAB — COMPREHENSIVE METABOLIC PANEL
ALBUMIN: 3.1 g/dL — AB (ref 3.5–5.0)
ALT: 19 U/L (ref 17–63)
ANION GAP: 7 (ref 5–15)
AST: 26 U/L (ref 15–41)
Alkaline Phosphatase: 46 U/L (ref 38–126)
BUN: 8 mg/dL (ref 6–20)
CALCIUM: 8.6 mg/dL — AB (ref 8.9–10.3)
CO2: 28 mmol/L (ref 22–32)
Chloride: 102 mmol/L (ref 101–111)
Creatinine, Ser: 1.07 mg/dL (ref 0.61–1.24)
GFR calc non Af Amer: >60 mL/min (ref >60)
GLUCOSE: 97 mg/dL (ref 65–99)
POTASSIUM: 3.4 mmol/L — AB (ref 3.5–5.1)
SODIUM: 137 mmol/L (ref 135–145)
TOTAL PROTEIN: 6.4 g/dL — AB (ref 6.5–8.1)
Total Bilirubin: 1.7 mg/dL — ABNORMAL HIGH (ref 0.3–1.2)

## 2016-07-08 MED ORDER — POTASSIUM CHLORIDE CRYS ER 10 MEQ PO TBCR
10.0000 meq | EXTENDED_RELEASE_TABLET | Freq: Every day | ORAL | Status: DC
  Administered 2016-07-08 – 2016-07-16 (×9): 10 meq via ORAL
  Filled 2016-07-08 (×9): qty 1

## 2016-07-08 MED ORDER — ASPIRIN EC 81 MG PO TBEC
81.0000 mg | DELAYED_RELEASE_TABLET | Freq: Every day | ORAL | Status: DC
  Administered 2016-07-08 – 2016-07-16 (×9): 81 mg via ORAL
  Filled 2016-07-08 (×9): qty 1

## 2016-07-08 MED ORDER — SENNOSIDES-DOCUSATE SODIUM 8.6-50 MG PO TABS: 1.0000 | ORAL_TABLET | Freq: Every evening | ORAL | Status: DC | PRN

## 2016-07-08 NOTE — IPOC Note (Addendum)
Overall Plan of Care [facility](IPOC) Patient Details Name: Darrell Watson MRN: [id number] DOB: 06-Dec-1960  Admitting Diagnosis: CVA  Hospital Problems: Principal Problem:   Small vessel disease, cerebrovascular Active Problems:   Right sided weakness     Functional Problem List: Nursing Endurance, Motor, Pain, Safety, Skin Integrity, Medication Management  PT Balance, Endurance, Motor, Safety, Pain  OT Balance, Safety, Cognition, Endurance, Motor  SLP Cognition, Linguistic  TR         Basic ADL's: OT Grooming, Bathing, Dressing, Toileting     Advanced  ADL's: OT       Transfers: PT Bed Mobility, Bed to Chair, Car, Occupational psychologisturniture  OT Toilet, Research scientist (life sciences)Tub/Shower     Locomotion: PT Ambulation, Psychologist, prison and probation servicesWheelchair Mobility, Stairs     Additional Impairments: OT Fuctional Use of Upper Extremity  SLP None      TR      Anticipated Outcomes Item Anticipated Outcome  Self Feeding N/A  Swallowing      Basic self-care  Supervision-Mod I   Toileting  Mod I    Bathroom Transfers Supervision-Mod I   Bowel/Bladder  continent of bowel and bladder.  Transfers  modI  Locomotion  S gait LRAD  Communication     Cognition  Supervision  Pain  Pain less than or equal to 2.  Safety/Judgment  min assist for transfer.   Therapy Plan: PT Intensity: Minimum of 1-2 x/day ,45 to 90 minutes PT Frequency: 5 out of 7 days PT Duration Estimated Length of Stay: 14-16 days OT Intensity: Minimum of 1-2 x/day, 45 to 90 minutes OT Frequency: 5 out of 7 days OT Duration/Estimated Length of Stay: 14-16 days SLP Intensity: Minumum of 1-2 x/day, 30 to 90 minutes SLP Frequency: 3 to 5 out of 7 days SLP Duration/Estimated Length of Stay: 10 to 12 days       Team Interventions: Nursing Interventions Patient/Family Education, Disease Management/Prevention, Pain Management, Medication Management, Skin Care/Wound Management, Discharge Planning, Psychosocial Support  PT interventions Ambulation/gait training,  Warden/rangerBalance/vestibular training, Cognitive remediation/compensation, FirefighterCommunity reintegration, DME/adaptive equipment instruction, Pain management, Patient/family education, Stair training, UE/LE Coordination activities, UE/LE Strength taining/ROM, Splinting/orthotics, Therapeutic Exercise, Wheelchair propulsion/positioning, Therapeutic Activities, Psychosocial support, Functional mobility training, Discharge planning, Disease management/prevention, Neuromuscular re-education  OT Interventions Discharge planning, Self Care/advanced ADL retraining, Therapeutic Activities, UE/LE Coordination activities, Cognitive remediation/compensation, Functional mobility training, Patient/family education, Therapeutic Exercise, DME/adaptive equipment instruction, Psychosocial support, UE/LE Strength taining/ROM, Wheelchair propulsion/positioning  SLP Interventions Cognitive remediation/compensation, Environmental controls, Functional tasks, Medication managment, Patient/family education, Therapeutic Activities, Internal/external aids  TR Interventions    SW/CM Interventions  Discharge Planning, Pt/FamilyEducation and Psychosocial Assessment    Team Discharge Planning: Destination: PT-Home ,OT- Home , SLP-Home Projected Follow-up: PT-Home health PT, OT-  Home health OT, Outpatient OT, SLP-Outpatient SLP Projected Equipment Needs: PT-To be determined, OT- 3 in 1 bedside comode, Tub/shower bench, To be determined, SLP-None recommended by SLP Equipment Details: PT-has quad cane, OT-  Patient/family involved in discharge planning: PT- Patient,  OT-Patient, SLP-Patient, Family member/caregiver  MD ELOS: 7-10d Medical Rehab Prognosis:  Excellent Assessment:  56 y.o. right handed male with history of recent L5-S1 fusion anterior approach 06/28/2016. Per chart review patient lives with spouse. 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 One level home with 1 step to  entry. Presented 07/03/2016 with right-sided weakness and facial droop with slurred speech. Cranial CT scan negative for acute changes. MRI of the brain showed small acute infarct within the left posterior limb  of internal capsule and punctate acute infarct right temporal operculum. No associated hemorrhage. MRA negative. Patient did not receive TPA. Echocardiogram with ejection fraction of 50% no wall motion abnormalities. No cardiac source of emboli identified. Carotid Dopplers with no ICA stenosis. Findings of significant decrease in hemoglobin from 15.2-8.3 since recent back surgery. CT abdomen and pelvis showed an intramuscular hematoma of the left rectus muscle measuring 7 x 3.4 x 9 cm in overlying the left psoas 10.2 x 9 x 4.6 cm.Vascular surgery consulted in regards to rectus psoas hematoma advise conservative care.   Now requiring 24/7 Rehab RN,MD, as well as CIR level PT, OT and SLP.  Treatment team will focus on ADLs and mobility with goals set at Supervision See Team Conference Notes for weekly updates to the plan of care

## 2016-07-08 NOTE — Progress Notes (Signed)
Subjective/Complaints: Labs reviewed  Discussed low K and Hgb  ROS- freq stooling, no abd , no N/V , no CP or SOB  Objective: Vital Signs: Blood pressure 127/61, pulse 99, temperature 98.8 F (37.1 C), temperature source Oral, resp. rate 18, height '5\' 10"'$  (1.778 m), weight 88.5 kg (195 lb), SpO2 100 %. No results found. Results for orders placed or performed during the hospital encounter of 07/05/16 (from the past 72 hour(s))  CBC     Status: Abnormal   Collection Time: 07/05/16  7:22 PM  Result Value Ref Range   WBC 11.4 (H) 4.0 - 10.5 K/uL   RBC 3.83 (L) 4.22 - 5.81 MIL/uL   Hemoglobin 10.5 (L) 13.0 - 17.0 g/dL   HCT 33.3 (L) 39.0 - 52.0 %   MCV 86.9 78.0 - 100.0 fL   MCH 27.4 26.0 - 34.0 pg   MCHC 31.5 30.0 - 36.0 g/dL   RDW 14.5 11.5 - 15.5 %   Platelets 362 150 - 400 K/uL  Creatinine, serum     Status: None   Collection Time: 07/05/16  7:22 PM  Result Value Ref Range   Creatinine, Ser 1.16 0.61 - 1.24 mg/dL   GFR calc non Af Amer >60 >60 mL/min   GFR calc Af Amer >60 >60 mL/min    Comment: (NOTE) The eGFR has been calculated using the CKD EPI equation. This calculation has not been validated in all clinical situations. eGFR's persistently <60 mL/min signify possible Chronic Kidney Disease.   CBC WITH DIFFERENTIAL     Status: Abnormal   Collection Time: 07/08/16  4:12 AM  Result Value Ref Range   WBC 10.2 4.0 - 10.5 K/uL   RBC 3.78 (L) 4.22 - 5.81 MIL/uL   Hemoglobin 10.5 (L) 13.0 - 17.0 g/dL   HCT 33.1 (L) 39.0 - 52.0 %   MCV 87.6 78.0 - 100.0 fL   MCH 27.8 26.0 - 34.0 pg   MCHC 31.7 30.0 - 36.0 g/dL   RDW 14.6 11.5 - 15.5 %   Platelets 376 150 - 400 K/uL   Neutrophils Relative % 72 %   Neutro Abs 7.4 1.7 - 7.7 K/uL   Lymphocytes Relative 12 %   Lymphs Abs 1.2 0.7 - 4.0 K/uL   Monocytes Relative 11 %   Monocytes Absolute 1.1 (H) 0.1 - 1.0 K/uL   Eosinophils Relative 5 %   Eosinophils Absolute 0.5 0.0 - 0.7 K/uL   Basophils Relative 0 %   Basophils  Absolute 0.0 0.0 - 0.1 K/uL  Comprehensive metabolic panel     Status: Abnormal   Collection Time: 07/08/16  4:12 AM  Result Value Ref Range   Sodium 137 135 - 145 mmol/L   Potassium 3.4 (L) 3.5 - 5.1 mmol/L   Chloride 102 101 - 111 mmol/L   CO2 28 22 - 32 mmol/L   Glucose, Bld 97 65 - 99 mg/dL   BUN 8 6 - 20 mg/dL   Creatinine, Ser 1.07 0.61 - 1.24 mg/dL   Calcium 8.6 (L) 8.9 - 10.3 mg/dL   Total Protein 6.4 (L) 6.5 - 8.1 g/dL   Albumin 3.1 (L) 3.5 - 5.0 g/dL   AST 26 15 - 41 U/L   ALT 19 17 - 63 U/L   Alkaline Phosphatase 46 38 - 126 U/L   Total Bilirubin 1.7 (H) 0.3 - 1.2 mg/dL   GFR calc non Af Amer >60 >60 mL/min   GFR calc Af Amer >60 >60 mL/min  Comment: (NOTE) The eGFR has been calculated using the CKD EPI equation. This calculation has not been validated in all clinical situations. eGFR's persistently <60 mL/min signify possible Chronic Kidney Disease.    Anion gap 7 5 - 15     HEENT: normal Cardio: RRR and no murmur Resp: CTA B/L and unlabored GI: BS positive and NT, ND Extremity:  Pulses positive and No Edema Skin:   Intact Neuro: Alert/Oriented, Flat, Normal Sensory, Abnormal Motor 4/5 in Right delt bi, tri, grip HF, KE , ADF, 4/5 L HF, grip, 5/5 in remaining muscle groups on Left side and Abnormal FMC Ataxic/ dec FMC Musc/Skel:  Other PIP amp digits 3,4,5 Gen NAD   Assessment/Plan: 1. Functional deficits secondary to embolic L PLIC/temperoal/opercular infarct, lumbar fusion 06/03/61 complicated by L Psoas hematoma which require 3+ hours per day of interdisciplinary therapy in a comprehensive inpatient rehab setting. Physiatrist is providing close team supervision and 24 hour management of active medical problems listed below. Physiatrist and rehab team continue to assess barriers to discharge/monitor patient progress toward functional and medical goals. FIM: Function - Bathing Position: Shower Body parts bathed by patient: Right arm, Left arm, Chest,  Abdomen, Front perineal area, Buttocks, Right upper leg, Left upper leg, Right lower leg, Left lower leg Body parts bathed by helper: Back Assist Level: Touching or steadying assistance(Pt > 75%)  Function- Upper Body Dressing/Undressing What is the patient wearing?: Orthosis Pull over shirt/dress - Perfomed by patient: Thread/unthread right sleeve, Thread/unthread left sleeve, Put head through opening, Pull shirt over trunk Orthosis activity level: Performed by patient Assist Level: Supervision or verbal cues Function - Lower Body Dressing/Undressing What is the patient wearing?: Underwear, Pants, Non-skid slipper socks Underwear - Performed by patient: Thread/unthread right underwear leg, Thread/unthread left underwear leg, Pull underwear up/down Pants- Performed by patient: Thread/unthread right pants leg, Thread/unthread left pants leg, Pull pants up/down Non-skid slipper socks- Performed by helper: Don/doff right sock, Don/doff left sock Assist for footwear: Maximal assist Assist for lower body dressing: Touching or steadying assistance (Pt > 75%)  Function - Toileting Toileting steps completed by patient: Performs perineal hygiene Toileting steps completed by helper: Adjust clothing prior to toileting, Adjust clothing after toileting Toileting Assistive Devices: Grab bar or rail Assist level: Touching or steadying assistance (Pt.75%)  Function - Air cabin crew transfer assistive device: Grab bar, Elevated toilet seat/BSC over toilet Assist level to toilet: Moderate assist (Pt 50 - 74%/lift or lower) Assist level from toilet: Moderate assist (Pt 50 - 74%/lift or lower)  Function - Chair/bed transfer Chair/bed transfer method: Stand pivot Chair/bed transfer assist level: Moderate assist (Pt 50 - 74%/lift or lower) Chair/bed transfer assistive device: Armrests Chair/bed transfer details: Manual facilitation for weight shifting, Verbal cues for precautions/safety, Verbal  cues for technique, Manual facilitation for placement  Function - Locomotion: Wheelchair Will patient use wheelchair at discharge?: No Type: Manual Max wheelchair distance: 150 Assist Level: Touching or steadying assistance (Pt > 75%) Assist Level: Touching or steadying assistance (Pt > 75%) Assist Level: Touching or steadying assistance (Pt > 75%) Turns around,maneuvers to table,bed, and toilet,negotiates 3% grade,maneuvers on rugs and over doorsills: No Function - Locomotion: Ambulation Assistive device: Walker-rolling Max distance: 150 Assist level: Moderate assist (Pt 50 - 74%) Assist level: Moderate assist (Pt 50 - 74%) Assist level: Moderate assist (Pt 50 - 74%) Assist level: Moderate assist (Pt 50 - 74%) Assist level: Moderate assist (Pt 50 - 74%)  Function - Comprehension Comprehension: Auditory Comprehension assist level: Follows  complex conversation/direction with extra time/assistive device  Function - Expression Expression: Verbal Expression assist level: Expresses complex ideas: With no assist  Function - Social Interaction Social Interaction assist level: Interacts appropriately with others with medication or extra time (anti-anxiety, antidepressant).  Function - Problem Solving Problem solving assist level: Solves complex problems: With extra time  Function - Memory Memory assist level: More than reasonable amount of time Patient normally able to recall (first 3 days only): Current season, Location of own room, Staff names and faces, That he or she is in a hospital  1. 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  CIR PT, OT, SLP - Back brace when out of bed - mild L hip flexor weakness from psoas hematoma 2. DVT Prophylaxis/Anticoagulation: Subcutaneous Lovenox. Monitor platelet  counts (362K on 3/9)  of any signs of bleeding. Venous Doppler study negative  3. Pain Management: Oxycodone and Robaxin as needed  -consider pre-treating prior to therapies  4. Mood: Provide emotional support  5. Neuropsych: This patient is capable of making decisions on his own behalf.  6. Skin/Wound Care: Routine skin checks  7. Fluids/Electrolytes/Nutrition: Routine I&O with follow-up chemistries , HypoK- 3.4 today, KCL supplement  8. Acute blood loss anemia due to hematoma/post-op. Follow serial Hgb--9.9 > 10.5 on 3/12 .  Improving no further w/u at this time 9. Constipation. Laxative assistance   LOS (Days) 3 A FACE TO FACE EVALUATION WAS PERFORMED  Darrell Watson E 07/08/2016, 7:35 AM

## 2016-07-08 NOTE — Progress Notes (Signed)
Occupational Therapy Session Note  Patient Details  Name: Darrell Watson M Mcelvain MRN: [id number] Date of Birth: [birth year]  Today's Date: 07/08/2016 OT Individual Time: 1100-1158 OT Individual Time Calculation (min): 58 min    Short Term Goals: Week 1:  OT Short Term Goal 1 (Week 1): Pt will complete sit<stand during LB dressing with Min A OT Short Term Goal 2 (Week 1): Pt will complete toilet transfer with Min A and LRAD OT Short Term Goal 3 (Week 1): Pt will complete 1 grooming task in standing with Min A for balance  OT Short Term Goal 4 (Week 1): Pt will complete tub bench transfer with Min A and LRAD  Skilled Therapeutic Interventions/Progress Updates:    Upon entering the room, pt seated in recliner chair with no c/o pain. Pt requiring set up A to don lumbar corset. Pt verbalized 3/3 precautions with 100% accuracy. OT provided total A to don B LE TEDs. Pt crossing ankle to knee in order to don shoes with assistance for heels. Pt able to tie shoe laces with increased time. Pt performed stand pivot transfer with mod A from recliner chair >wheelchair. Pt propelled wheelchair via B UEs to tub room. OT educated and demonstrated transfer onto TTB in tub/shower combination. Pt returned demonstration with steady assistance. Pt utilized B UE's to pull LE's over tub ledge. OT recommended pt purchase safety treads and hand held shower head for home use. Pt verbalized understanding. He returned to wheelchair in same manner as above. Pt returned to room and OT educated pt and caregivers on CVA risk factors and secondary stroke risk. Education to continue.   Therapy Documentation Precautions:  Precautions Precautions: Fall, Back Precaution Comments: able to report 3/3 precautions Required Braces or Orthoses: Spinal Brace Spinal Brace: Lumbar corset, Applied in sitting position Restrictions Weight Bearing Restrictions: No General:   Vital Signs:   Pain:   ADL: ADL ADL Comments: Please see functional  navigator for ADL status Exercises:   Other Treatments:    See Function Navigator for Current Functional Status.   Therapy/Group: Individual Therapy  Alen BleacherBradsher, Lochlin Eppinger P 07/08/2016, 12:16 PM

## 2016-07-08 NOTE — Care Management Note (Signed)
Inpatient Rehabilitation Center Individual Statement of Services  Patient Name:  Darrell Watson  Date:  07/08/2016  Welcome to the [facility]patient [facility].  Our goal is to provide you with an individualized program based on your diagnosis and situation, designed to meet your specific needs.  With this comprehensive rehabilitation program, you will be expected to participate in at least 3 hours of rehabilitation therapies Monday-Friday, with modified therapy programming on the weekends.  Your rehabilitation program will include the following services:  Physical Therapy (PT), Occupational Therapy (OT), Speech Therapy (ST), 24 hour per day rehabilitation nursing, Therapeutic Recreaction (TR), Neuropsychology, Case Management (Social Worker), Rehabilitation Medicine, Nutrition Services and Pharmacy Services  Weekly team conferences will be held on Wednesday to discuss your progress.  Your Social Worker will talk with you frequently to get your input and to update you on team discussions.  Team conferences with you and your family in attendance may also be held.  Expected length of stay: 14-16 days Overall anticipated outcome: Mod/i-supervision with cues  Depending on your progress and recovery, your program may change. Your Social Worker will coordinate services and will keep you informed of any changes. Your Social Worker's name and contact numbers are listed  below.  The following services may also be recommended but are not provided by the Inpatient Rehabilitation Center:   Driving Evaluations  Home Health Rehabiltiation Services  Outpatient Rehabilitation Services  Vocational Rehabilitation   Arrangements will be made to provide these services after discharge if needed.  Arrangements include referral to agencies that provide these services.  Your insurance has been verified to be:  [facility] Your primary doctor is:  Renford DillsRonald Polite  Pertinent information will be shared with your doctor  and your insurance company.  Social Worker:  Dossie DerBecky Saniya Tranchina, SW [phone] or [phone]  Information discussed with and copy given to patient by: Lucy Chrisupree, [name], 07/08/2016, 10:51 AM

## 2016-07-08 NOTE — Progress Notes (Signed)
Patient information reviewed and entered into eRehab system by Sky Primo, RN, CRRN, PPS Coordinator.  Information including medical coding and functional independence measure will be reviewed and updated through discharge.    

## 2016-07-08 NOTE — Progress Notes (Signed)
Social Work Assessment and Plan Social Work Assessment and Plan  Patient Details  Name: Darrell Watson MRN: [id number] Date of Birth: 09/06/1960  Today's Date: 07/08/2016  Problem List:  Patient Active Problem List   Diagnosis Date Noted  . Small vessel disease, cerebrovascular 07/05/2016  . Right sided weakness 07/03/2016  . Facial droop 07/03/2016  . Stroke (HCC) 07/03/2016  . Anemia   . Tachycardia   . Back pain 06/27/2016   Past Medical History:  Past Medical History:  Diagnosis Date  . Anemia   . Arthritis   . Tachycardia    Past Surgical History:  Past Surgical History:  Procedure Laterality Date  . ABDOMINAL EXPOSURE N/A 06/27/2016   Procedure: ABDOMINAL EXPOSURE;  Surgeon: Chuck Hinthristopher S Dickson, MD;  [facility];  Service: Vascular;  Laterality: N/A;  . ANTERIOR LUMBAR FUSION N/A 06/27/2016   Procedure: ANTERIOR LUMBAR FUSION L5-S1;  Surgeon: Venita Lickahari Brooks, MD;  Location: [facility] OR;  Service: Orthopedics;  Laterality: N/A;  Requests 3 hours  . FINGER AMPUTATION Left   . SHOULDER SURGERY     Social History:  reports that he has never smoked. He has never used smokeless tobacco. He reports that he drinks alcohol. He reports that he does not use drugs.  Family / Support Systems Marital Status: Married Patient Roles: Partner, Parent, Other (Comment) (employee) Spouse/Significant Other: Payton DoughtyMalvina Sandige-girlfriend/fiance [phone]-cell Children: Justin-son [phone] Other Supports: Friends and colleagues Anticipated Caregiver: fiance and son Ability/Limitations of Caregiver: Both fiance and son work but will make arrangements for him and what his needs are Caregiver Availability: Other (Comment) (Awaiting team's recommendations) Family Dynamics: Close knit family who will make accomendations for his care and whatever is needed. Both son and fiance are here and observing him in therapies today and here to provide emotional support.   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 He was not expecting having a stroke along with his back surgery Legal Hisotry/Current Legal Issues: No issues Guardian/Conservator: None-according to MD pt is capable of making his own decisions while here. Family reports someone will always be here with him.   Abuse/Neglect Physical Abuse: Denies Verbal Abuse: Denies Sexual Abuse: Denies Exploitation of patient/patient's resources: Denies Self-Neglect: Denies  Emotional Status Pt's affect, behavior adn adjustment status: Pt is motivated and wants to recover from his back surgery and stroke he had after the surgery. He reports they run in his family but never thought he would have one. He tries to stay in shape and eat's right. He has always been independent and plans to return to this. Recent Psychosocial Issues: thought he was healthy until this and his back surgery was scheduled due to pain he as having.  Pyschiatric History: No history but is certainly high risk for depression due to the double whammy he has been dealt with back surgery and now a stroke. Will make referral to neuro-psych to see while here for coping. Substance Abuse History: No issues  Patient / Family Perceptions, Expectations & Goals Pt/Family understanding of illness & functional limitations: Pt and fiance can explain his back surgery and stroke and deficits. They both have spoken with the MD and feel they have a good understanding of both. Both hopeful he will do well here. Premorbid pt/family roles/activities: Fiance, father, employee, Pharmacist, communitybody builder, friend, co-worker, Catering manageretc  Anticipated changes in roles/activities/participation: expects to resume these roles Pt/family expectations/goals: Pt states: " I hope to be  able to recover and be independent before I leave here."  Fiance states: " We will do whatever is needed to help him recover from this."  [employer]: None Premorbid Home Care/DME Agencies: None Transportation available at discharge: [employer] referrals recommended: Neuropsychology, Support group (specify)  Discharge Planning Living Arrangements: Spouse/significant other Support Systems: Spouse/significant other, Children, Manufacturing engineer, Psychologist, clinical community Type of Residence: Private residence Insurance Resources: Media planner (specify) Education officer, museum) Surveyor, quantity Resources: Employment, Garment/textile technologist Screen Referred: No Living Expenses: Psychologist, sport and exercise Management: Patient Does the patient have any problems obtaining your medications?: No Home Management: Both he and fiance Patient/Family Preliminary Plans: Return home with fiance who can move her schedule around to be able to provide assistance if this is needed upon discharge. Will await team conference and work on as safe discharge plan.  Social Work Anticipated Follow Up Needs: HH/OP, Support Group  Clinical Impression Pleasant gentleman who is soft spoken but willing to work hard to recover form this stroke. He has never had to rely upon anyone and doesn't plan to start now. His fiance and son are here and supportive and plan to be Here daily to provide emotional support while here. Feel pt would benefit from seeing neuro-psych will make referral while here to be seen. Will work on safe discharge plan.  Lucy Chris 07/08/2016, 11:16 AM

## 2016-07-08 NOTE — Progress Notes (Signed)
Speech Language Pathology Daily Session Note  Patient Details  Name: Darrell Watson MRN: [id number] Date of Birth: [birth year]  Today's Date: 07/08/2016 SLP Individual Time: [id number] SLP Individual Time Calculation (min): 57 min  Short Term Goals: Week 1: SLP Short Term Goal 1 (Week 1): Pt will utilize external memory aids for recall of new information with supervision cues.  SLP Short Term Goal 2 (Week 1): Pt will complete complex money management tasks with supervision cues.  SLP Short Term Goal 3 (Week 1): Pt will complete complex medication management tasks with supervision cues.  SLP Short Term Goal 4 (Week 1): Pt will demonstrate selective attention in moderately distracting environment with supervision cues for redirection to tasks.  SLP Short Term Goal 5 (Week 1): Pt will demonstrate anticipatory awareness by listing 3 activites that are safe to participate in at home or work with supervision cues.   Skilled Therapeutic Interventions: Skilled treatment session focused on addressing cognition goals. SLP facilitated session by providing Supervision cues for organizing, self-monitoring and correcting errors during a 3D puzzle.  SLP also provided Min faded to Supervision cues for accuracy with mildly complex math calculations.  Continue with current plan of care.    Function:   Cognition Comprehension Comprehension assist level: Follows complex conversation/direction with extra time/assistive device  Expression   Expression assist level: Expresses complex ideas: With extra time/assistive device  Social Interaction Social Interaction assist level: Interacts appropriately with others with medication or extra time (anti-anxiety, antidepressant).  Problem Solving Problem solving assist level: Solves complex 90% of the time/cues < 10% of the time  Memory Memory assist level: Recognizes or recalls 90% of the time/requires cueing < 10% of the time    Pain Pain Assessment Pain Assessment:  No/denies pain  Therapy/Group: Individual Therapy  Darrell Watson, M.A., Darrell Watson [phone]  Tanice Petre 07/08/2016, 12:59 PM

## 2016-07-08 NOTE — Consult Note (Signed)
Darrell Apo, MD Chief Complaint: Stroke History: See medical / rehab admission notes. Reviewed no changes Past Medical History:  Diagnosis Date  . Anemia   . Arthritis   . Tachycardia     No Known Allergies  No current facility-administered medications on file prior to encounter.    Current Outpatient Prescriptions on File Prior to Encounter  Medication Sig Dispense Refill  . acetaminophen (TYLENOL) 325 MG tablet Take 650 mg by mouth every 6 (six) hours as needed (for pain.).    Marland Kitchen methocarbamol (ROBAXIN) 500 MG tablet Take 1 tablet (500 mg total) by mouth 3 (three) times daily as needed for muscle spasms. 21 tablet 0  . ondansetron (ZOFRAN) 4 MG tablet Take 1 tablet (4 mg total) by mouth every 8 (eight) hours as needed for nausea or vomiting. 20 tablet 0  . oxyCODONE-acetaminophen (PERCOCET) 10-325 MG tablet Take 1 tablet by mouth every 4 (four) hours as needed for pain. 42 tablet 0  . senna-docusate (SENOKOT-S) 8.6-50 MG tablet Take 1 tablet by mouth 2 (two) times daily.    Marland Kitchen testosterone cypionate (DEPOTESTOSTERONE CYPIONATE) 200 MG/ML injection Inject 300 mg into the muscle every 14 (fourteen) days. Gets 1.5 ml at Dr office every 2 weeks Next dose is due 06-24-2016      Physical Exam: Vitals:   07/07/16 1336 07/08/16 0536  BP: 139/66 127/61  Pulse: (!) 110 99  Resp: 20 18  Temp: 98 F (36.7 C) 98.8 F (37.1 C)  A+O X3 No SOB/CP Ad soft/NT Neuro: UE: 5/5 left side. 4/5 right side.  Sensation to light touch grossly intact.  LE: 4 to 4+/5 bilaterally.   (Pain with LE motor testing is noted) No incontinence of B/B    Image: Ct Abdomen Pelvis Wo Contrast  Addendum Date: 07/03/2016   ADDENDUM REPORT: 07/03/2016 17:46 ADDENDUM: Nodular densities as stated in the report along the major fissure on the right may represent small intrafissural lymph nodes. There are right lower lobe 6 and 7 mm nodular densities for which follow-up is recommended. Non-contrast chest CT at  3-6 months is recommended. If the nodules are stable at time of repeat CT, then future CT at 18-24 months (from today's scan) is considered optional for low-risk patients, but is recommended for high-risk patients. This recommendation follows the consensus statement: Guidelines for Management of Incidental Pulmonary Nodules Detected on CT Images: From the Fleischner Society 2017; Radiology 2017; 284:228-243. Electronically Signed   By: Tollie Eth M.D.   On: 07/03/2016 17:46   Result Date: 07/03/2016 CLINICAL DATA:  Anemia, history of recent back surgery 06/27/2016 EXAM: CT ABDOMEN AND PELVIS WITHOUT CONTRAST TECHNIQUE: Multidetector CT imaging of the abdomen and pelvis was performed following the standard protocol without IV contrast. COMPARISON:  06/28/2016 radiographs of the lumbar spine FINDINGS: Lower chest: Under nodular densities along the right major fissure and right lower lobe possibly representing intrafissural lymph nodes. There are 6 and 7 mm nodular densities in the right lower lobe. No pulmonary consolidation. Minimal pleural thickening and/or atelectasis posteriorly at the left lung base. Hepatobiliary: 6 mm hypodensity in the left hepatic lobe may represent a small cyst or hemangioma. Further characterization not possible given lack of IV contrast. Gallbladder is unremarkable. No biliary dilatation. Pancreas: Normal Spleen: Normal Adrenals/Urinary Tract: No nephrolithiasis nor obstructive uropathy. No focal renal mass identified. Contracted bladder. Stomach/Bowel: Stomach is within normal limits. Appendix appears normal. No evidence of bowel wall thickening, distention, or inflammatory changes. Vascular/Lymphatic: Small retroperitoneal periaortic and mesenteric lymph  nodes, the largest approximately 10 mm short axis. No aneurysm. Reproductive: Prostate is unremarkable. Other: Hematomas of the left lower rectus muscle and superficially over the left psoas muscle are identified, the left psoas  measuring 4.6 x 9 x 10.2 cm in the left rectus measuring approximately 7 x 3.4 x 9 cm. Overlying subcutaneous emphysema in the left lower quadrant with postsurgical change noted. Tiny foci of air noted adjacent to the left psoas hematoma believed to be within adjacent bowel or related to postsurgical change. An evolving abscess cannot be entirely excluded. Musculoskeletal: L5-S1 fusion hardware is noted. IMPRESSION: 1. Intramuscular hematoma of the left rectus muscle measuring 7 x 3.4 x 9 cm and overlying the left psoas measuring 10.2 x 9 x 4.6 cm. Adjacent tiny foci of gas are believed to be post pop related or potentially due to adjacent non-opacified small bowel adjacent to the left psoas hematoma. Cannot exclude the possibility of a developing abscess. Clinical correlation and follow-up is suggested. 2. L5-S1 fusion. Electronically Signed: By: Tollie Eth M.D. On: 07/03/2016 17:36   Dg Chest 2 View  Result Date: 07/03/2016 CLINICAL DATA:  Clinical signs of a CVA today. History of tachycardia. EXAM: CHEST  2 VIEW COMPARISON:  Portable chest x-rays of March 30, 2015 FINDINGS: The lungs are well-expanded and clear. The heart and pulmonary vascularity are normal. The mediastinum is normal in width. There is no pleural effusion. The bony thorax is unremarkable. IMPRESSION: There is no active cardiopulmonary disease. Electronically Signed   By: David  [place] M.D.   On: 07/03/2016 15:08   Dg Lumbar Spine 2-3 Views  Result Date: 06/28/2016 CLINICAL DATA:  Status post lumbar fusion EXAM: LUMBAR SPINE - 2-3 VIEW COMPARISON:  06/27/2016 FINDINGS: Anterior fusion at L5-S1 is noted stable from the prior postoperative study. No compression deformity is seen. No soft tissue abnormality is noted. IMPRESSION: Stable L5-S1 fusion. Electronically Signed   By: Alcide Clever M.D.   On: 06/28/2016 09:52   Dg Lumbar Spine 2-3 Views  Result Date: 06/27/2016 CLINICAL DATA:  ALIF EXAM: DG C-ARM 61-120 MIN; LUMBAR SPINE - 2-3  VIEW COMPARISON:  Lumbar MRI 10/13/2015 FINDINGS: AP and lateral C-arm images were obtained in the operating room. Images showed satisfactory placement of interbody spacer at L5-S1. Final image shows interbody spacer and anterior plate and screws in satisfactory position L5-S1. IMPRESSION: L5-S1 ALIF Electronically Signed   By: Marlan Palau M.D.   On: 06/27/2016 11:15   Ct Head Wo Contrast  Result Date: 07/03/2016 CLINICAL DATA:  Altered mental status today.  Right facial droop. EXAM: CT HEAD WITHOUT CONTRAST TECHNIQUE: Contiguous axial images were obtained from the base of the skull through the vertex without intravenous contrast. COMPARISON:  Head CT scan 01/03/2010. FINDINGS: Brain: Appears normal without hemorrhage, infarct, mass lesion, mass effect, midline shift or abnormal extra-axial fluid collection. No hydrocephalus or pneumocephalus. Vascular: Negative. Skull: Intact. Sinuses/Orbits: Small mucous retention cyst or polyp right maxillary sinus is unchanged. Other: None. IMPRESSION: Negative head CT. Unchanged small mucous retention cyst or polyp right maxillary sinus is noted. Electronically Signed   By: Drusilla Kanner M.D.   On: 07/03/2016 13:38   Mr Brain Wo Contrast  Result Date: 07/03/2016 CLINICAL DATA:  56 y/o M; right leg weakness, facial droop, and slurred speech. EXAM: MRI HEAD WITHOUT CONTRAST MRA HEAD WITHOUT CONTRAST TECHNIQUE: Multiplanar, multiecho pulse sequences of the brain and surrounding structures were obtained without intravenous contrast. Angiographic images of the head were obtained using MRA technique  without contrast. COMPARISON:  None. 07/03/2016 CT of the head. FINDINGS: MRI HEAD FINDINGS Brain: 10 x 6 mm focus of low diffusivity within the left posterior limb of internal capsule (series 4, image 22). 3 mm focus of low diffusivity within the right temporal operculum (Series 4, image 19). No additional diffusion signal abnormality. T2 hyperintense focus in the right hemi  pons. No focal mass effect, intracranial hemorrhage, hydrocephalus, or extra-axial collection. Vascular: As below. Skull and upper cervical spine: Normal marrow signal. Sinuses/Orbits: Mild anterior ethmoid sinus mucosal thickening and small mucous retention cyst in the right maxillary sinus. No abnormal signal of mastoid air cells. Orbits are unremarkable. Other: Right superolateral low signal dermal lesion overlying parietal bone measuring up to 10 mm (series 10, image 12). Direct visualization recommended. MRA HEAD FINDINGS Internal carotid arteries:  Patent. Anterior cerebral arteries:  Patent. Middle cerebral arteries: Patent. Anterior communicating artery: Patent. Posterior communicating arteries: Small patent left. No right identified, likely hypoplastic or absent. Posterior cerebral arteries:  Patent. Basilar artery:  Patent. Vertebral arteries:  Patent. No evidence of high-grade stenosis, large vessel occlusion, or aneurysm. IMPRESSION: 1. 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 No associated hemorrhage. Different vascular territories suggests embolic etiology. 2. T2 hyperintense focus in right hemi pons, possibly chronic lacunar infarct or prominent perivascular space. 3. Mild paranasal sinus disease. 4. Dermal lesion in right superolateral scalp overlying parietal bone, direct visualization recommended. 5. Normal MRA of the head without contrast. These results will be called to the ordering clinician or representative by the Radiologist Assistant, and communication documented in the PACS or zVision Dashboard. Electronically Signed   By: Mitzi HansenLance  Furusawa-Stratton M.D.   On: 07/03/2016 16:40   Dg C-arm 61-120 Min  Result Date: 06/27/2016 CLINICAL DATA:  ALIF EXAM: DG C-ARM 61-120 MIN; LUMBAR SPINE - 2-3 VIEW COMPARISON:  Lumbar MRI 10/13/2015 FINDINGS: AP and lateral C-arm images were obtained in the operating room.  Images showed satisfactory placement of interbody spacer at L5-S1. Final image shows interbody spacer and anterior plate and screws in satisfactory position L5-S1. IMPRESSION: L5-S1 ALIF Electronically Signed   By: Marlan Palauharles  Clark M.D.   On: 06/27/2016 11:15   Mr Maxine GlennMra Head/brain ZOWo Cm  Result Date: 07/03/2016 CLINICAL DATA:  56 y/o M; right leg weakness, facial droop, and slurred speech. EXAM: MRI HEAD WITHOUT CONTRAST MRA HEAD WITHOUT CONTRAST TECHNIQUE: Multiplanar, multiecho pulse sequences of the brain and surrounding structures were obtained without intravenous contrast. Angiographic images of the head were obtained using MRA technique without contrast. COMPARISON:  None. 07/03/2016 CT of the head. FINDINGS: MRI HEAD FINDINGS Brain: 10 x 6 mm focus of low diffusivity within the left posterior limb of internal capsule (series 4, image 22). 3 mm focus of low diffusivity within the right temporal operculum (Series 4, image 19). No additional diffusion signal abnormality. T2 hyperintense focus in the right hemi pons. No focal mass effect, intracranial hemorrhage, hydrocephalus, or extra-axial collection. Vascular: As below. Skull and upper cervical spine: Normal marrow signal. Sinuses/Orbits: Mild anterior ethmoid sinus mucosal thickening and small mucous retention cyst in the right maxillary sinus. No abnormal signal of mastoid air cells. Orbits are unremarkable. Other: Right superolateral low signal dermal lesion overlying parietal bone measuring up to 10 mm (series 10, image 12). Direct visualization recommended. MRA HEAD FINDINGS Internal carotid arteries:  Patent. Anterior cerebral arteries:  Patent. Middle cerebral arteries: Patent. Anterior communicating artery: Patent. Posterior communicating  arteries: Small patent left. No right identified, likely hypoplastic or absent. Posterior cerebral arteries:  Patent. Basilar artery:  Patent. Vertebral arteries:  Patent. No evidence of high-grade stenosis, large  vessel occlusion, or aneurysm. IMPRESSION: 1. 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 No associated hemorrhage. Different vascular territories suggests embolic etiology. 2. T2 hyperintense focus in right hemi pons, possibly chronic lacunar infarct or prominent perivascular space. 3. Mild paranasal sinus disease. 4. Dermal lesion in right superolateral scalp overlying parietal bone, direct visualization recommended. 5. Normal MRA of the head without contrast. These results will be called to the ordering clinician or representative by the Radiologist Assistant, and communication documented in the PACS or zVision Dashboard. Electronically Signed   By: Mitzi Hansen M.D.   On: 07/03/2016 16:40   Dg Or Local Abdomen  Addendum Date: 06/27/2016   ADDENDUM REPORT: 06/27/2016 11:15 ADDENDUM: These results were called by telephone at the time of interpretation on 06/27/2016 at 11:14 am to Dr. Venita Lick in the OR, who verbally acknowledged these results. No unexpected radiopaque foreign bodies in the visualized abdomen or pelvis. Original report is otherwise unchanged. Electronically Signed   By: Delbert Phenix M.D.   On: 06/27/2016 11:15   Result Date: 06/27/2016 CLINICAL DATA:  Low back pain with leg pain.  ALIF. EXAM: OR LOCAL ABDOMEN COMPARISON:  10/13/2015 MRI lumbar spine. Intraoperative fluoroscopic radiographs from earlier today. FINDINGS: Portable intraoperative supine radiographs of the abdomen and pelvis demonstrate expected postsurgical changes from anterior spinal fusion at L5-S1 with bone cage overlying the L5-S1 disc space and no evidence of hardware fracture or loosening on these views. Surgical clips overlie the left deep pelvis. Nonobstructive bowel gas pattern. No evidence of pneumatosis or pneumoperitoneum. Healed deformities in the bilateral lower pubic rami. Clear lung bases. Esophageal temperature  probe overlies the lower thoracic esophagus. IMPRESSION: Expected postsurgical changes from anterior spinal fusion at L5-S1. Electronically Signed: By: Delbert Phenix M.D. On: 06/27/2016 11:03    A/P: Patient s/p ALIF without issue/complication. Discharged to home after uneventful hospital course. On 3/8 presented to ER with stroke. Unfortunately I have been out of town and unaware of the situation. Patient on rehab making excellent progress per his and families standpoint Plan: CT scan shows post-op hematoma which is WNL.  At this point ok to stop lovenox and use teds/scd's for DVT prevention. Continue rehab as he is making progress Will continue to monitor exam Will remove sutures later this week

## 2016-07-08 NOTE — Progress Notes (Signed)
Physical Therapy Session Note  Patient Details  Name: Darrell Watson MRN: [id number] Date of Birth: 10/06/1960  Today's Date: 07/08/2016 PT Individual Time: 1405-1530 PT Individual Time Calculation (min): 85 min   Short Term Goals: Week 1:  PT Short Term Goal 1 (Week 1): Pt will demonstrate bed mobility from flat bed without bedrails with S PT Short Term Goal 2 (Week 1): Pt will transfer stand pivot minA PT Short Term Goal 3 (Week 1): Pt will ambulate LRAD x50' with minA PT Short Term Goal 4 (Week 1): Pt will demonstrate ascent/descent 12 6-inch stairs with min guard  Skilled Therapeutic Interventions/Progress Updates: Pt presented in bathroom. w/c agreeable to therapy. Per pt and nsg ambulation on hold due to change in meds and treatment of hematomas. Pt propelled to ADL apt and performed stand pivot transfer to bed. Practiced sit to/from supine requiring mod A for LE placement for sit to supine. Performed supine to sit with min guard. Pt able verbalize and demonstrate spinal precautions during bed mobility and transfers. Pt transported to rehab gym and performed stand pivot to mat. Reinforcement of hand placement to facilitate transfer. Performed static standing balance while using BUE (forced use of RUE) for NMR. Activities included sorting, peg board and horseshoes. Pt performed sit to/from stand with continued reinforcement of hand placement and cues for attempting increased eccentric control when descending. Pt able to control descend approx 50% of trials.  Noted increased fatigue at end of session with increased tremors in BLE. Pt propelled 100ft returned to room for endurance and forced RUE use. Pt remained in w/c at end of session with family and visitors present and all current needs met.      Therapy Documentation Precautions:  Precautions Precautions: Fall, Back Precaution Comments: able to report 3/3 precautions Required Braces or Orthoses: Spinal Brace Spinal Brace: Lumbar corset,  Applied in sitting position Restrictions Weight Bearing Restrictions: No General:   Vital Signs: Therapy Vitals Temp: 98.9 F (37.2 C) Temp Source: Oral Pulse Rate: (!) 105 Resp: 17 BP: (!) 146/64 Patient Position (if appropriate): Sitting Oxygen Therapy SpO2: 99 % O2 Device: Not Delivered Pain: Pain Assessment Pain Assessment: No/denies pain  See Function Navigator for Current Functional Status.   Therapy/Group: Individual Therapy      , PTA  07/08/2016, 3:45 PM  

## 2016-07-09 ENCOUNTER — Inpatient Hospital Stay (HOSPITAL_COMMUNITY): Payer: 59 | Admitting: Speech Pathology

## 2016-07-09 ENCOUNTER — Inpatient Hospital Stay (HOSPITAL_COMMUNITY): Payer: Self-pay | Admitting: Physical Therapy

## 2016-07-09 ENCOUNTER — Inpatient Hospital Stay (HOSPITAL_COMMUNITY): Payer: 59 | Admitting: Occupational Therapy

## 2016-07-09 MED ORDER — TRAMADOL HCL 50 MG PO TABS
50.0000 mg | ORAL_TABLET | Freq: Four times a day (QID) | ORAL | Status: DC | PRN
  Administered 2016-07-09 – 2016-07-11 (×4): 50 mg via ORAL
  Filled 2016-07-09 (×4): qty 1

## 2016-07-09 NOTE — Progress Notes (Signed)
Physical Therapy Session Note  Patient Details  Name: Darrell Watson MRN: [id number] Date of Birth: 04-13-1961  Today's Date: 07/09/2016 PT Individual Time: 1400-1510 PT Individual Time Calculation (min): 70 min   Short Term Goals: Week 1:  PT Short Term Goal 1 (Week 1): Pt will demonstrate bed mobility from flat bed without bedrails with S PT Short Term Goal 2 (Week 1): Pt will transfer stand pivot minA PT Short Term Goal 3 (Week 1): Pt will ambulate LRAD x50' with minA PT Short Term Goal 4 (Week 1): Pt will demonstrate ascent/descent 12 6-inch stairs with min guard  Skilled Therapeutic Interventions/Progress Updates:    no c/o pain.  Session focus on NMR, balance, ambulation, and strengthening.    W/C propulsion to therapy gym with BUEs for UE strengthening, coordination, and activity tolerance.  Gait training x100' + 160' with RW and min assist with verbal cues for R knee flexion in swing through, and anterior pelvic tilt.  Pt with step through pattern, decreased knee flexion in swing bilat, and ataxic gait.  Pt also demonstrates ?spasticity in LLE during swing phase in 25% of opportunities.   NMR: R heel taps to 6" box and 4" box in sitting (8 reps) focus on hip flexor/DF strengthening, and coordination, and 2x10 reps hip adduction/abduction.  nustep x10 minutes with 4 extremities at level 4 for reciprocal stepping pattern retraining, activity tolerance, and attention RLE positioning.  Pt requires min verbal cues to attend to RLE positioning on pedal.  Standing tolerance with L foot wedged to facilitate R weight shift during table top task x5 minutes.  Repeated sit<>squat x8 reps without UE support focus on forward weight shift to decrease strain when powering up.    Pt returned to room at end of session and positioned in w/c with call bell in reach and needs met.   Therapy Documentation Precautions:  Precautions Precautions: Fall, Back Precaution Comments: able to report 3/3  precautions Required Braces or Orthoses: Spinal Brace Spinal Brace: Lumbar corset, Applied in sitting position Restrictions Weight Bearing Restrictions: No   See Function Navigator for Current Functional Status.   Therapy/Group: Individual Therapy  Earnest Conroy Penven-Crew 07/09/2016, 2:27 PM

## 2016-07-09 NOTE — Progress Notes (Signed)
Occupational Therapy Session Note  Patient Details  Name: Darrell Watson MRN: [id number] Date of Birth: 11-Oct-1960  Today's Date: 07/09/2016 OT Individual Time: 1052-1200 OT Individual Time Calculation (min): 68 min    Short Term Goals: Week 1:  OT Short Term Goal 1 (Week 1): Pt will complete sit<stand during LB dressing with Min A OT Short Term Goal 2 (Week 1): Pt will complete toilet transfer with Min A and LRAD OT Short Term Goal 3 (Week 1): Pt will complete 1 grooming task in standing with Min A for balance  OT Short Term Goal 4 (Week 1): Pt will complete tub bench transfer with Min A and LRAD  Skilled Therapeutic Interventions/Progress Updates:    Treatment session with focus on motor control during sit <> stand, standing balance, and functional mobility.  Pt declined bathing this session.  Donned lumbar corset with setup and increased time.  Pt able to verbalize 3/3 back precautions.  Engaged in stand pivot transfer to therapy mat with min-mod assist with UE support on therapist.  Engaged in sit <> stand from edge of mat with focus on motor control and sequencing due to tendency to "plop" back down.  Transitional movement improved with RW in front to provide UE support during weight shift.  Engaged in alternating stepping with colored dots and then stepping onto 4" step with focus on weight shifting as needed for functional mobility and LB dressing.  Completed 3D pipe tree puzzle in standing with focus on following visual directions and incorporating RUE into task.  Min cues to incorporate RUE.  Noted decreased strength in RUE when propelling w/c back to room, as pt would veer to Rt.  Therapy Documentation Precautions:  Precautions Precautions: Fall, Back Precaution Comments: able to report 3/3 precautions Required Braces or Orthoses: Spinal Brace Spinal Brace: Lumbar corset, Applied in sitting position Restrictions Weight Bearing Restrictions: No Pain: Pain Assessment Pain  Assessment: 0-10 Pain Score: 2  Pain Type: Surgical pain Pain Location: Abdomen Pain Orientation: Anterior Pain Descriptors / Indicators: Sore Pain Frequency: Intermittent Pain Onset: With Activity Patients Stated Pain Goal: 2 Pain Intervention(s): Medication (See eMAR);Repositioned Multiple Pain Sites: No  See Function Navigator for Current Functional Status.   Therapy/Group: Individual Therapy  [name], Elizet Kaplan 07/09/2016, 12:27 PM

## 2016-07-09 NOTE — Progress Notes (Signed)
Subjective/Complaints:  Problems sleeping but no other issues Discussed anticoag with Dr Rolena Infante, feels like babay ASA should not aggravate psoas bleed  ROS- freq stooling, no abd , no N/V , no CP or SOB  Objective: Vital Signs: Blood pressure 125/74, pulse (!) 102, temperature 98 F (36.7 C), temperature source Oral, resp. rate 16, height '5\' 10"'$  (1.778 m), weight 88.5 kg (195 lb), SpO2 99 %. No results found. Results for orders placed or performed during the hospital encounter of 07/05/16 (from the past 72 hour(s))  CBC WITH DIFFERENTIAL     Status: Abnormal   Collection Time: 07/08/16  4:12 AM  Result Value Ref Range   WBC 10.2 4.0 - 10.5 K/uL   RBC 3.78 (L) 4.22 - 5.81 MIL/uL   Hemoglobin 10.5 (L) 13.0 - 17.0 g/dL   HCT 33.1 (L) 39.0 - 52.0 %   MCV 87.6 78.0 - 100.0 fL   MCH 27.8 26.0 - 34.0 pg   MCHC 31.7 30.0 - 36.0 g/dL   RDW 14.6 11.5 - 15.5 %   Platelets 376 150 - 400 K/uL   Neutrophils Relative % 72 %   Neutro Abs 7.4 1.7 - 7.7 K/uL   Lymphocytes Relative 12 %   Lymphs Abs 1.2 0.7 - 4.0 K/uL   Monocytes Relative 11 %   Monocytes Absolute 1.1 (H) 0.1 - 1.0 K/uL   Eosinophils Relative 5 %   Eosinophils Absolute 0.5 0.0 - 0.7 K/uL   Basophils Relative 0 %   Basophils Absolute 0.0 0.0 - 0.1 K/uL  Comprehensive metabolic panel     Status: Abnormal   Collection Time: 07/08/16  4:12 AM  Result Value Ref Range   Sodium 137 135 - 145 mmol/L   Potassium 3.4 (L) 3.5 - 5.1 mmol/L   Chloride 102 101 - 111 mmol/L   CO2 28 22 - 32 mmol/L   Glucose, Bld 97 65 - 99 mg/dL   BUN 8 6 - 20 mg/dL   Creatinine, Ser 1.07 0.61 - 1.24 mg/dL   Calcium 8.6 (L) 8.9 - 10.3 mg/dL   Total Protein 6.4 (L) 6.5 - 8.1 g/dL   Albumin 3.1 (L) 3.5 - 5.0 g/dL   AST 26 15 - 41 U/L   ALT 19 17 - 63 U/L   Alkaline Phosphatase 46 38 - 126 U/L   Total Bilirubin 1.7 (H) 0.3 - 1.2 mg/dL   GFR calc non Af Amer >60 >60 mL/min   GFR calc Af Amer >60 >60 mL/min    Comment: (NOTE) The eGFR has been  calculated using the CKD EPI equation. This calculation has not been validated in all clinical situations. eGFR's persistently <60 mL/min signify possible Chronic Kidney Disease.    Anion gap 7 5 - 15     HEENT: normal Cardio: RRR and no murmur Resp: CTA B/L and unlabored GI: BS positive and NT, ND Extremity:  Pulses positive and No Edema Skin:   Intact Neuro: Alert/Oriented, Flat, Normal Sensory, Abnormal Motor 4/5 in Right delt bi, tri, grip HF, KE , ADF, 4/5 L HF, grip, 5/5 in remaining muscle groups on Left side and Abnormal FMC Ataxic/ dec FMC Musc/Skel:  Other PIP amp digits 3,4,5 Gen NAD   Assessment/Plan: 1. Functional deficits secondary to embolic L PLIC/temperoal/opercular infarct, lumbar fusion 11/28/40 complicated by L Psoas hematoma which require 3+ hours per day of interdisciplinary therapy in a comprehensive inpatient rehab setting. Physiatrist is providing close team supervision and 24 hour management of active medical problems  listed below. Physiatrist and rehab team continue to assess barriers to discharge/monitor patient progress toward functional and medical goals. FIM: Function - Bathing Position: Shower Body parts bathed by patient: Right arm, Left arm, Chest, Abdomen, Front perineal area, Buttocks, Right upper leg, Left upper leg, Right lower leg, Left lower leg Body parts bathed by helper: Back Assist Level: Touching or steadying assistance(Pt > 75%)  Function- Upper Body Dressing/Undressing What is the patient wearing?: Orthosis Pull over shirt/dress - Perfomed by patient: Thread/unthread right sleeve, Thread/unthread left sleeve, Put head through opening, Pull shirt over trunk Orthosis activity level: Performed by patient Assist Level: Supervision or verbal cues Function - Lower Body Dressing/Undressing What is the patient wearing?: [employer], Shoes Underwear - Performed by patient: Thread/unthread right underwear leg, Thread/unthread left underwear leg,  Pull underwear up/down Pants- Performed by patient: Thread/unthread right pants leg, Thread/unthread left pants leg, Pull pants up/down Non-skid slipper socks- Performed by helper: Don/doff right sock, Don/doff left sock Shoes - Performed by patient: Fasten right, Fasten left Shoes - Performed by helper: Don/doff right shoe, Don/doff left shoe TED Hose - Performed by helper: Don/doff right TED hose, Don/doff left TED hose Assist for footwear: Partial/moderate assist Assist for lower body dressing: Touching or steadying assistance (Pt > 75%)  Function - Toileting Toileting steps completed by patient: Performs perineal hygiene Toileting steps completed by helper: Adjust clothing prior to toileting, Performs perineal hygiene, Adjust clothing after toileting (per Candice, NT report) Toileting Assistive Devices: Grab bar or rail Assist level: Touching or steadying assistance (Pt.75%)  Function - Air cabin crew transfer assistive device: Grab bar, Elevated toilet seat/BSC over toilet Assist level to toilet: Moderate assist (Pt 50 - 74%/lift or lower) Assist level from toilet: Moderate assist (Pt 50 - 74%/lift or lower)  Function - Chair/bed transfer Chair/bed transfer method: Stand pivot Chair/bed transfer assist level: Moderate assist (Pt 50 - 74%/lift or lower) Chair/bed transfer assistive device: Armrests Chair/bed transfer details: Manual facilitation for weight shifting, Verbal cues for precautions/safety, Verbal cues for technique, Manual facilitation for placement  Function - Locomotion: Wheelchair Will patient use wheelchair at discharge?: No Type: Manual Max wheelchair distance: 150 Assist Level: Touching or steadying assistance (Pt > 75%) Assist Level: Touching or steadying assistance (Pt > 75%) Assist Level: Touching or steadying assistance (Pt > 75%) Turns around,maneuvers to table,bed, and toilet,negotiates 3% grade,maneuvers on rugs and over doorsills: No Function -  Locomotion: Ambulation Assistive device: Walker-rolling Max distance: 150 Assist level: Moderate assist (Pt 50 - 74%) Assist level: Moderate assist (Pt 50 - 74%) Assist level: Moderate assist (Pt 50 - 74%) Assist level: Moderate assist (Pt 50 - 74%) Assist level: Moderate assist (Pt 50 - 74%)  Function - Comprehension Comprehension: Auditory Comprehension assist level: Follows complex conversation/direction with extra time/assistive device  Function - Expression Expression: Verbal Expression assist level: Expresses complex ideas: With extra time/assistive device  Function - Social Interaction Social Interaction assist level: Interacts appropriately with others with medication or extra time (anti-anxiety, antidepressant).  Function - Problem Solving Problem solving assist level: Solves complex 90% of the time/cues < 10% of the time  Function - Memory Memory assist level: Recognizes or recalls 90% of the time/requires cueing < 10% of the time Patient normally able to recall (first 3 days only): Current season, Location of own room, Staff names and faces, That he or she is in a hospital  1. 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  CIR PT, OT, SLP team conf in am - Back brace when out of bed - mild L hip flexor weakness from psoas hematoma 2. DVT Prophylaxis/Anticoagulation: Subcutaneous Lovenox. Monitor platelet counts (362K on 3/9)  of any signs of bleeding. Venous Doppler study negative  ASA '81mg'$  started for CVA prophyllaxis, SCD for DVT prevention 3. Pain Management: Oxycodone and Robaxin as needed  -consider pre-treating prior to therapies  4. Mood: Provide emotional support  5. Neuropsych: This patient is capable of making decisions on his own behalf.  6. Skin/Wound Care: Routine skin checks  7.  Fluids/Electrolytes/Nutrition: Routine I&O with follow-up chemistries , HypoK- 3.4 today, KCL supplement  8. Acute blood loss anemia due to hematoma/post-op. Follow serial Hgb--9.9 > 10.5 on 3/12 .  Improving no further w/u at this time 9. Constipation. Laxative assistance   LOS (Days) 4 A FACE TO FACE EVALUATION WAS PERFORMED  Holley Kocurek E 07/09/2016, 8:41 AM

## 2016-07-09 NOTE — Progress Notes (Signed)
Speech Language Pathology Daily Session Note  Patient Details  Name: Darrell Watson MRN: [id number] Date of Birth: 07-07-1960  Today's Date: 07/09/2016 SLP Individual Time: 1300-1330 SLP Individual Time Calculation (min): 30 min  Short Term Goals: Week 1: SLP Short Term Goal 1 (Week 1): Pt will utilize external memory aids for recall of new information with supervision cues.  SLP Short Term Goal 2 (Week 1): Pt will complete complex money management tasks with supervision cues.  SLP Short Term Goal 3 (Week 1): Pt will complete complex medication management tasks with supervision cues.  SLP Short Term Goal 4 (Week 1): Pt will demonstrate selective attention in moderately distracting environment with supervision cues for redirection to tasks.  SLP Short Term Goal 5 (Week 1): Pt will demonstrate anticipatory awareness by listing 3 activites that are safe to participate in at home or work with supervision cues.   Skilled Therapeutic Interventions: Skilled treatment session focused on cognitive goals. Patient recalled his current medications and organized a BID pill box with Mod I. Patient demonstrated selective attention to tasks in a mildly distracting environment for 20 minutes with Mod I. Patient left upright in wheelchair with family present. Continue with current plan of care.      Function:  Eating Eating   Modified Consistency Diet: No Eating Assist Level: Set up assist for   Eating Set Up Assist For: Opening containers;Cutting food       Cognition Comprehension Comprehension assist level: Follows basic conversation/direction with extra time/assistive device  Expression   Expression assist level: Expresses basic needs/ideas: With no assist  Social Interaction Social Interaction assist level: Interacts appropriately with others with medication or extra time (anti-anxiety, antidepressant).  Problem Solving Problem solving assist level: Solves basic problems with no assist  Memory  Memory assist level: Recognizes or recalls 90% of the time/requires cueing < 10% of the time    Pain No/Denies Pain   Therapy/Group: Individual Therapy  Marionette Meskill 07/09/2016, 3:34 PM

## 2016-07-09 NOTE — Progress Notes (Signed)
Speech Language Pathology Daily Session Note  Patient Details  Name: Darrell Watson MRN: [id number] Date of Birth: February 29, 1961  Today's Date: 07/09/2016 SLP Individual Time: 1530-1600 SLP Individual Time Calculation (min): 30 min  Short Term Goals: Week 1: SLP Short Term Goal 1 (Week 1): Pt will utilize external memory aids for recall of new information with supervision cues.  SLP Short Term Goal 2 (Week 1): Pt will complete complex money management tasks with supervision cues.  SLP Short Term Goal 3 (Week 1): Pt will complete complex medication management tasks with supervision cues.  SLP Short Term Goal 4 (Week 1): Pt will demonstrate selective attention in moderately distracting environment with supervision cues for redirection to tasks.  SLP Short Term Goal 5 (Week 1): Pt will demonstrate anticipatory awareness by listing 3 activites that are safe to participate in at home or work with supervision cues.   Skilled Therapeutic Interventions: Skilled treatment focused on cognition goals. SLP facilitated session by providing Min A verbal cues for completion of deductive reasoning puzzle. Pt was returned to room, left in wheelchair with son present. Continue per current plan of care.      Function:   Cognition Comprehension Comprehension assist level: Follows basic conversation/direction with extra time/assistive device  Expression   Expression assist level: Expresses basic needs/ideas: With no assist  Social Interaction Social Interaction assist level: Interacts appropriately with others with medication or extra time (anti-anxiety, antidepressant).  Problem Solving Problem solving assist level: Solves basic 90% of the time/requires cueing < 10% of the time  Memory Memory assist level: Recognizes or recalls 90% of the time/requires cueing < 10% of the time    Pain    Therapy/Group: Individual Therapy   Rosaland Shiffman B. Dreama Saaverton, M.S., CCC-SLP Speech-Language Pathologist   Aideen Fenster 07/09/2016, 4:25 PM

## 2016-07-10 ENCOUNTER — Inpatient Hospital Stay (HOSPITAL_COMMUNITY): Payer: 59 | Admitting: Occupational Therapy

## 2016-07-10 ENCOUNTER — Inpatient Hospital Stay (HOSPITAL_COMMUNITY): Payer: 59 | Admitting: Speech Pathology

## 2016-07-10 ENCOUNTER — Inpatient Hospital Stay (HOSPITAL_COMMUNITY): Payer: Self-pay | Admitting: Physical Therapy

## 2016-07-10 ENCOUNTER — Encounter (HOSPITAL_COMMUNITY): Payer: Self-pay | Admitting: Psychology

## 2016-07-10 MED ORDER — TRAZODONE HCL 50 MG PO TABS
50.0000 mg | ORAL_TABLET | Freq: Every evening | ORAL | Status: DC | PRN
  Administered 2016-07-11 – 2016-07-13 (×3): 50 mg via ORAL
  Filled 2016-07-10 (×3): qty 1

## 2016-07-10 MED ORDER — ENSURE ENLIVE PO LIQD
237.0000 mL | Freq: Every day | ORAL | Status: DC
  Administered 2016-07-10 – 2016-07-15 (×6): 237 mL via ORAL

## 2016-07-10 NOTE — Consult Note (Signed)
Neuropsychological Consultation Note   Patient:   Darrell Watson   DOB:   1960-06-17  MR Number:  [id number]  Location:  [facility] [address] [facility] [id number] [place] [address] Dept: [phone] Loc: [phone]           Date of Service:   07/10/16  Start Time:   9 AM End Time:   10 AM  Provider/Observer:   Arley Phenix, Psy.D.       Billing Code/Service: [phone]  Chief Complaint:    No chief complaint on file.   Reason for Service:  Darrell Watson is a 56 y.o. right handed male.  He recently had L5-S1 fusion on 06/28/16.  Darrell Watson presented on 07/03/2016 with right-sided weakness and facial droop with slurred speech. Cranial CT scan was negative for acute changes. MRI of the brain showed small acute infarct within the left posterior limb of the internal capsule and another infarct in the right temporal region. MRA was negative. Patient did not receive TPA.  The patient's description was that the night before he presented initially there was some right side weakness in his arms and legs as well as face. He presented with slurred speech.  He was talking with his girlfriend who told him he was "talking like a baby." He felt like he was really tired and went to bed. When he woke up the next morning his symptoms had continued and that was when they transferred him to the emergency department.  The patient had been a very active individual who was working a fairly strenuous maintenance job and also very physically fit and actively working out and engaged in Technical brewer activities.  This sudden change in functioning has had a particularly impact on his coping.  Current Status:  The patient is dealing with this sudden change in functioning fairly well. The patient reports that initially it was a major adjustment for him but he does feel like he is improving in physical therapy and is ready to actively work during his  inpatient hospitalization and continue to work on physical therapy once he is discharged. The patient denies any significant symptoms of depression but does acknowledge a lot of frustration with all of the events recently.  Behavioral Observation: Darrell Watson  presents as a 56 y.o.-year-old Right African American Male who appeared his stated age. his dress was Appropriate and he was Well Groomed and his manners were Appropriate to the situation.  his participation was indicative of Appropriate behaviors.  There were physical disabilities noted.  he displayed an appropriate level of cooperation and motivation.     Interactions:    Active Appropriate  Attention:   within normal limits and attention span and concentration were age appropriate  Memory:   within normal limits; recent and remote memory intact  Visuo-spatial:  within normal limits  Speech (Volume):  normal  Speech:   slurred; The patient reports that his speech has returned to his baseline, but son reports that there is still some mild changes but has improved significantly.  Thought Process:  Coherent  Though Content:  WNL; not suicidal  Orientation:   person, place, time/date and situation  Judgment:   Good  Planning:   Good  Affect:    Frustrated  Mood:    The patient reports he is frustrated with situation but ready to go home even though he realizes that he still has work to do.  Insight:   Present  Intelligence:   normal  Current Employment: Pt has been working with [employer] for more than 20 years.  He works as a Teaching laboratory technicianMaintenance Supervisor but does report that it is a very physical job with a lot of lifting.  He was told he could return to that but the neurosurgeon after the surgery.  Has had stroke after surgery.  Substance Use:  No concerns of substance abuse are reported.    Medical History:   Past Medical History:  Diagnosis Date  . Anemia   . Arthritis   . Tachycardia        @ENCMED @       Sexual  History:   History  Sexual Activity  . Sexual activity: Not on file    Family Med/Psych History: History reviewed. No pertinent family history.  Risk of Suicide/Violence: virtually non-existent Patient denies any SI or HI  Impression/DX:  Patient is a 56 year old post spinal fusion on 06/28/16 with CVA/stroke primarily on left hemisphere.  He has made some significant recovery but dealing with frustration and coping issues.    Disposition/Plan:  Worked on coping issues and agreed to follow-up after discharge from inpatient admission.        Electronically Signed   _______________________ Arley PhenixJohn Mika Griffitts, Psy.D.

## 2016-07-10 NOTE — Progress Notes (Signed)
Occupational Therapy Session Note  Patient Details  Name: Esperanza HeirOllis M Joiner MRN: [id number] Date of Birth: Dec 29, 1960  Today's Date: 07/10/2016 OT Individual Time: 1400-1500 OT Individual Time Calculation (min): 60 min    Short Term Goals: Week 1:  OT Short Term Goal 1 (Week 1): Pt will complete sit<stand during LB dressing with Min A OT Short Term Goal 2 (Week 1): Pt will complete toilet transfer with Min A and LRAD OT Short Term Goal 3 (Week 1): Pt will complete 1 grooming task in standing with Min A for balance  OT Short Term Goal 4 (Week 1): Pt will complete tub bench transfer with Min A and LRAD  Skilled Therapeutic Interventions/Progress Updates:    Treatment session with focus on functional transfers, LB dressing, AE education, and RUE strength and coordination.  Pt propelled w/c to ADL apt with supervision/cues to attend to Rt side due to veering to Rt (RUE weakness).  Educated on use of tub transfer bench in shower to increase safety with transfer and to maintain seated position during bathing due to back precautions/orders.  Pt return demonstrated transfer with supervision.  Ambulated to therapy gym with RW and min assist.  Engaged in simulated LB dressing with use of theraband with pt able to cross RLE over Lt knee to thread pant leg and then able to thread Lt with same technique.  Educated on use of various AE to increase independence with LB dressing, however as pt is able to maintain back precautions with current technique plan to continue in similar manner.  Engaged in pill box assessment, with pt unable to complete task in allotted 5 minutes and requiring cues for clarification as pt attempting to place 3 "pills" in same slot when pill box stating "one pill 3x daily".  Engaged in discussion regarding following of instructions to decrease adverse reactions.  Pt required increased time when opening pill boxes and demonstrating decreased coordination by dropping 3 "pills".  Ambulated back to  room with RW and min assist while engaging in divided attention with carrying on conversation.  Therapy Documentation Precautions:  Precautions Precautions: Fall, Back Precaution Comments: able to report 3/3 precautions Required Braces or Orthoses: Spinal Brace Spinal Brace: Lumbar corset, Applied in sitting position Restrictions Weight Bearing Restrictions: Yes Pain:  Pt with no c/o pain  See Function Navigator for Current Functional Status.   Therapy/Group: Individual Therapy  [name], Chaz Mcglasson 07/10/2016, 3:30 PM

## 2016-07-10 NOTE — Progress Notes (Signed)
Social Work Patient ID: Darrell Watson, male   DOB: [birth year], 56 y.o.   MRN: [id number]  Met with pt and son who was in his room to discuss team conference goals mod/-supervision level and target discharge date 3/20. Between son and fiance one of them will be there with him. Discussed OP therapies he was agreeable and will await team's recommendations for equipment. Pt is pleased with how well he is doing and hopeful his progress will continue. Will work on Financial planner needs for next Tuesday.

## 2016-07-10 NOTE — Progress Notes (Signed)
Nutrition Follow-up  DOCUMENTATION CODES:   Not applicable  INTERVENTION:  Provide Ensure Enlive po once daily, each supplement provides 350 kcal and 20 grams of protein  Encourage adequate PO intake.   NUTRITION DIAGNOSIS:   Inadequate oral intake related to poor appetite as evidenced by meal completion < 25%; improving  GOAL:   Patient will meet greater than or equal to 90% of their needs; met  MONITOR:   PO intake, Supplement acceptance, Weight trends, I & O's  REASON FOR ASSESSMENT:   Consult Assessment of nutrition requirement/status  ASSESSMENT:   56 y/o male PMHx Chronic Left sided pain. Presented 3/7 with R side weakness, facial droop and slurred speech. Admitted to inpatient Rehab  Pt was unavailable during attempted time of visit. Meal completion has been varied from 25-90% with most recent intake at 75-90%. Intake at meals have been improving. RD to order Ensure once daily to provide adequate caloric and protein needs.   Labs and medications reviewed.   Diet Order:  Diet regular Room service appropriate? Yes; Fluid consistency: Thin  Skin:   (Incision on abdomen)  Last BM:  3/13  Height:   Ht Readings from Last 1 Encounters:  07/05/16 _0  (1.778 m)    Weight:   Wt Readings from Last 1 Encounters:  07/10/16 196 lb 8.5 oz (89.1 kg)    Ideal Body Weight:  75.45 kg  BMI:  Body mass index is 28.2 kg/m.  Estimated Nutritional Needs:   Kcal:  2000-2200 kcals (23-25 kcal/kg)  Protein:  75-90 g Pro (1-1.2 g/kg bw)  Fluid:  2-2.2 L (62m/kcal)  EDUCATION NEEDS:   No education needs identified at this time  SCorrin Parker MS, RD, LDN Pager # [phone] hours/ weekend pager # [phone]

## 2016-07-10 NOTE — Progress Notes (Signed)
Physical Therapy Session Note  Patient Details  Name: Darrell Watson MRN: [id number] Date of Birth: Jun 26, 1960  Today's Date: 07/10/2016 PT Individual Time: 1600-1630 PT Individual Time Calculation (min): 30 min   Short Term Goals: Week 1:  PT Short Term Goal 1 (Week 1): Pt will demonstrate bed mobility from flat bed without bedrails with S PT Short Term Goal 2 (Week 1): Pt will transfer stand pivot minA PT Short Term Goal 3 (Week 1): Pt will ambulate LRAD x50' with minA PT Short Term Goal 4 (Week 1): Pt will demonstrate ascent/descent 12 6-inch stairs with min guard  Skilled Therapeutic Interventions/Progress Updates:    no c/o pain.  Session focus on gait training and activity tolerance.  Pt ambulates to and from therapy gym with supervision.  PT applied elastic shoe laces to shoes and provided pt with PLS AFO and 1/4" heel wedge for RLE for improved foot clearance and knee control in stance phase.  Pt demos improved RLE placement and decrease genu recurvatum with orthotics, however continues to demo decreased RLE clearance during gait.  Pt returned to room at end of session and positioned upright in w/c with call bell in reach and needs met.   Therapy Documentation Precautions:  Precautions Precautions: Fall, Back Precaution Comments: able to report 3/3 precautions Required Braces or Orthoses: Spinal Brace Spinal Brace: Lumbar corset, Applied in sitting position Restrictions Weight Bearing Restrictions: Yes   See Function Navigator for Current Functional Status.   Therapy/Group: Individual Therapy  Earnest Conroy Penven-Crew 07/10/2016, 5:08 PM

## 2016-07-10 NOTE — Progress Notes (Signed)
Speech Language Pathology Daily Session Note  Patient Details  Name: Darrell Watson MRN: [id number] Date of Birth: 05/07/1960  Today's Date: 07/10/2016 SLP Individual Time: 0820-0900 SLP Individual Time Calculation (min): 40 min  Short Term Goals: Week 1: SLP Short Term Goal 1 (Week 1): Pt will utilize external memory aids for recall of new information with supervision cues.  SLP Short Term Goal 2 (Week 1): Pt will complete complex money management tasks with supervision cues.  SLP Short Term Goal 3 (Week 1): Pt will complete complex medication management tasks with supervision cues.  SLP Short Term Goal 4 (Week 1): Pt will demonstrate selective attention in moderately distracting environment with supervision cues for redirection to tasks.  SLP Short Term Goal 5 (Week 1): Pt will demonstrate anticipatory awareness by listing 3 activites that are safe to participate in at home or work with supervision cues.   Skilled Therapeutic Interventions: Skilled treatment session focused on cognitive goals. Upon arrival, patient was sitting upright in wheelchair without back brace in place. Patient donned brace independently. Patient completed a complex money management task with Mod I and extra time. Patient left upright in wheelchair with all needs within reach. Continue with current plan of care.       Function:    Cognition Comprehension Comprehension assist level: Follows basic conversation/direction with extra time/assistive device  Expression   Expression assist level: Expresses basic needs/ideas: With no assist  Social Interaction Social Interaction assist level: Interacts appropriately with others with medication or extra time (anti-anxiety, antidepressant).  Problem Solving Problem solving assist level: Solves complex 90% of the time/cues < 10% of the time  Memory Memory assist level: Recognizes or recalls 90% of the time/requires cueing < 10% of the time    Pain Pain Assessment Faces  Pain Scale: No hurt  Therapy/Group: Individual Therapy  Jerre Vandrunen 07/10/2016, 12:52 PM

## 2016-07-10 NOTE — Progress Notes (Signed)
Subjective/Complaints:  No issues overnite, no change in strength noted  ROS- freq stooling, no abd , no N/V , no CP or SOB  Objective: Vital Signs: Blood pressure 137/71, pulse 96, temperature 98.5 F (36.9 C), temperature source Oral, resp. rate 18, height 5\' 10"  (1.778 m), weight 88.5 kg (195 lb), SpO2 100 %. No results found. Results for orders placed or performed during the hospital encounter of 07/05/16 (from the past 72 hour(s))  CBC WITH DIFFERENTIAL     Status: Abnormal   Collection Time: 07/08/16  4:12 AM  Result Value Ref Range   WBC 10.2 4.0 - 10.5 K/uL   RBC 3.78 (L) 4.22 - 5.81 MIL/uL   Hemoglobin 10.5 (L) 13.0 - 17.0 g/dL   HCT 09/07/16 (L) 08.4 - 61.7 %   MCV 87.6 78.0 - 100.0 fL   MCH 27.8 26.0 - 34.0 pg   MCHC 31.7 30.0 - 36.0 g/dL   RDW 82.0 93.8 - 86.3 %   Platelets 376 150 - 400 K/uL   Neutrophils Relative % 72 %   Neutro Abs 7.4 1.7 - 7.7 K/uL   Lymphocytes Relative 12 %   Lymphs Abs 1.2 0.7 - 4.0 K/uL   Monocytes Relative 11 %   Monocytes Absolute 1.1 (H) 0.1 - 1.0 K/uL   Eosinophils Relative 5 %   Eosinophils Absolute 0.5 0.0 - 0.7 K/uL   Basophils Relative 0 %   Basophils Absolute 0.0 0.0 - 0.1 K/uL  Comprehensive metabolic panel     Status: Abnormal   Collection Time: 07/08/16  4:12 AM  Result Value Ref Range   Sodium 137 135 - 145 mmol/L   Potassium 3.4 (L) 3.5 - 5.1 mmol/L   Chloride 102 101 - 111 mmol/L   CO2 28 22 - 32 mmol/L   Glucose, Bld 97 65 - 99 mg/dL   BUN 8 6 - 20 mg/dL   Creatinine, Ser 09/07/16 0.61 - 1.24 mg/dL   Calcium 8.6 (L) 8.9 - 10.3 mg/dL   Total Protein 6.4 (L) 6.5 - 8.1 g/dL   Albumin 3.1 (L) 3.5 - 5.0 g/dL   AST 26 15 - 41 U/L   ALT 19 17 - 63 U/L   Alkaline Phosphatase 46 38 - 126 U/L   Total Bilirubin 1.7 (H) 0.3 - 1.2 mg/dL   GFR calc non Af Amer >60 >60 mL/min   GFR calc Af Amer >60 >60 mL/min    Comment: (NOTE) The eGFR has been calculated using the CKD EPI equation. This calculation has not been validated in all  clinical situations. eGFR's persistently <60 mL/min signify possible Chronic Kidney Disease.    Anion gap 7 5 - 15     HEENT: normal Cardio: RRR and no murmur Resp: CTA B/L and unlabored GI: BS positive and NT, ND Extremity:  Pulses positive and No Edema Skin:   Intact Neuro: Alert/Oriented, Flat, Normal Sensory, Abnormal Motor 4/5 in Right delt bi, tri, grip HF, KE , ADF, 4/5 L HF, grip, 5/5 in remaining muscle groups on Left side and Abnormal FMC Ataxic/ dec FMC Musc/Skel:  Other PIP amp digits 3,4,5 Gen NAD   Assessment/Plan: 1. Functional deficits secondary to embolic L PLIC/temperoal/opercular infarct, lumbar fusion 06/28/16 complicated by L Psoas hematoma which require 3+ hours per day of interdisciplinary therapy in a comprehensive inpatient rehab setting. Physiatrist is providing close team supervision and 24 hour management of active medical problems listed below. Physiatrist and rehab team continue to assess barriers to discharge/monitor patient  progress toward functional and medical goals. FIM: Function - Bathing Position: Shower Body parts bathed by patient: Right arm, Left arm, Chest, Abdomen, Front perineal area, Buttocks, Right upper leg, Left upper leg, Right lower leg, Left lower leg Body parts bathed by helper: Back Assist Level: Touching or steadying assistance(Pt > 75%)  Function- Upper Body Dressing/Undressing What is the patient wearing?: Orthosis Pull over shirt/dress - Perfomed by patient: Thread/unthread right sleeve, Thread/unthread left sleeve, Put head through opening, Pull shirt over trunk Orthosis activity level: Performed by patient Assist Level: Supervision or verbal cues Function - Lower Body Dressing/Undressing What is the patient wearing?: [employer], Shoes Underwear - Performed by patient: Thread/unthread right underwear leg, Thread/unthread left underwear leg, Pull underwear up/down Pants- Performed by patient: Thread/unthread right pants leg,  Thread/unthread left pants leg, Pull pants up/down Non-skid slipper socks- Performed by helper: Don/doff right sock, Don/doff left sock Shoes - Performed by patient: Fasten right, Fasten left Shoes - Performed by helper: Don/doff right shoe, Don/doff left shoe TED Hose - Performed by helper: Don/doff right TED hose, Don/doff left TED hose Assist for footwear: Partial/moderate assist Assist for lower body dressing: Touching or steadying assistance (Pt > 75%)  Function - Toileting Toileting steps completed by patient: Performs perineal hygiene Toileting steps completed by helper: Adjust clothing prior to toileting, Performs perineal hygiene, Adjust clothing after toileting (per Candice, NT report) Toileting Assistive Devices: Grab bar or rail Assist level: Touching or steadying assistance (Pt.75%)  Function - Archivist transfer assistive device: Grab bar, Elevated toilet seat/BSC over toilet Assist level to toilet: Moderate assist (Pt 50 - 74%/lift or lower) Assist level from toilet: Moderate assist (Pt 50 - 74%/lift or lower)  Function - Chair/bed transfer Chair/bed transfer method: Stand pivot Chair/bed transfer assist level: Moderate assist (Pt 50 - 74%/lift or lower) Chair/bed transfer assistive device: Armrests Chair/bed transfer details: Manual facilitation for weight shifting, Verbal cues for precautions/safety, Verbal cues for technique, Manual facilitation for placement  Function - Locomotion: Wheelchair Will patient use wheelchair at discharge?: No Type: Manual Max wheelchair distance: 150 Assist Level: Touching or steadying assistance (Pt > 75%) Assist Level: Touching or steadying assistance (Pt > 75%) Assist Level: Touching or steadying assistance (Pt > 75%) Turns around,maneuvers to table,bed, and toilet,negotiates 3% grade,maneuvers on rugs and over doorsills: No Function - Locomotion: Ambulation Assistive device: Walker-rolling Max distance: 150 Assist  level: Moderate assist (Pt 50 - 74%) Assist level: Moderate assist (Pt 50 - 74%) Assist level: Moderate assist (Pt 50 - 74%) Assist level: Moderate assist (Pt 50 - 74%) Assist level: Moderate assist (Pt 50 - 74%)  Function - Comprehension Comprehension: Auditory Comprehension assist level: Follows basic conversation/direction with extra time/assistive device  Function - Expression Expression: Verbal Expression assist level: Expresses basic needs/ideas: With no assist  Function - Social Interaction Social Interaction assist level: Interacts appropriately with others with medication or extra time (anti-anxiety, antidepressant).  Function - Problem Solving Problem solving assist level: Solves basic 90% of the time/requires cueing < 10% of the time  Function - Memory Memory assist level: Recognizes or recalls 90% of the time/requires cueing < 10% of the time Patient normally able to recall (first 3 days only): Current season, Location of own room, Staff names and faces, That he or she is in a hospital  1. 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  CIR  PT, OT, SLP Team conference today please see physician documentation under team conference tab, met with team face-to-face to discuss problems,progress, and goals. Formulized individual treatment plan based on medical history, underlying problem and comorbidities. - Back brace when out of bed - mild L hip flexor weakness from psoas hematoma recheck cbc in am 2. DVT Prophylaxis/Anticoagulation: Subcutaneous Lovenox. Monitor platelet counts (362K on 3/9)  of any signs of bleeding. Venous Doppler study negative  ASA '81mg'$  started for CVA prophyllaxis, SCD for DVT prevention 3. Pain Management: Oxycodone and Robaxin as needed  -consider pre-treating prior to therapies   4. Mood: Provide emotional support  5. Neuropsych: This patient is capable of making decisions on his own behalf.  6. Skin/Wound Care: Routine skin checks  7. Fluids/Electrolytes/Nutrition: Routine I&O with follow-up chemistries , HypoK- , KCL supplement recheck BMET in am 8. Acute blood loss anemia due to hematoma/post-op. Follow serial Hgb--9.9 > 10.5 on 3/12 .  Improving no further w/u at this time 9. Constipation. Laxative assistance   LOS (Days) 5 A FACE TO FACE EVALUATION WAS PERFORMED  Jori Thrall E 07/10/2016, 7:31 AM

## 2016-07-10 NOTE — Patient Care Conference (Signed)
Inpatient RehabilitationTeam Conference and Plan of Care Update Date: 07/10/2016   Time: 11:15 AM    Patient Name: Darrell Watson      Medical Record Number: [id number]  Date of Birth: 27-Apr-1961 Sex: Male         Room/Bed: 4M06C/4M06C-01 Payor Info: Payor: [employer] / Plan: [employer] OTHER / Product Type: *No Product type* /    Admitting Diagnosis: CVA  Admit Date/Time:  07/05/2016  6:20 PM Admission Comments: No comment available   Primary Diagnosis:  Small vessel disease, cerebrovascular Principal Problem: Small vessel disease, cerebrovascular  Patient Active Problem List   Diagnosis Date Noted  . Small vessel disease, cerebrovascular 07/05/2016  . Right sided weakness 07/03/2016  . Facial droop 07/03/2016  . Stroke (HCC) 07/03/2016  . Anemia   . Tachycardia   . Back pain 06/27/2016    Expected Discharge Date: Expected Discharge Date: 07/16/16  Team Members Present: Physician leading conference: Dr. Claudette LawsAndrew Kirsteins Social Worker Present: Dossie DerBecky Locke Barrell, LCSW Nurse Present: Carmie EndAngie Joyce, RN PT Present: Teodoro Kilaitlin Penven-Crew, PT OT Present: Rosalio LoudSarah Hoxie, OT SLP Present: Feliberto Gottronourtney Payne, SLP PPS Coordinator present : Tora DuckMarie Noel, RN, CRRN     Current Status/Progress Goal Weekly Team Focus  Medical   No sig cognitive deficit per Neuropsych, Still has Left hip flexor weakness due to psoa hematoma in addition to R HP form CVA  improve BLE strength, maintain medical stability  evaluate and treat sleep disorder   Bowel/Bladder   pt is continent of bowel and bladder/ last BM on 07/09/16  continue to bathroom with stand pivot to wheelchair while pt is healing areas from bleeding in abdomal/rectal area  continue with current plan that is inplace until recommeded by physican   Swallow/Nutrition/ Hydration             ADL's   Mod assist bathing and LB dressing, min-mod assist transfers  Mod I overall, supervision shower  ADL retraining, motor quality, adaptive techniques  for bathing and dressing   Mobility   close supervision>min assist  mod I transfers, supervision gait/stairs  balance, activity tolerance, NMR, d/c planning    Communication             Safety/Cognition/ Behavioral Observations  Supervision-Min A  Mod I  complex problem solving, awareness   Pain   pt complains of moderate to severe pain in abdomial region  continue use of tramadol as prescribed for pain relief/ encouge patient to utilize pain medication to reduce over all stress to his body  educate patient about pain management during his recovery process ( he was trembling with pain when moving from bed/chair/toliet and he still would not use pain medication until encouraged to do so.Marland Kitchen.Marland Kitchen.pt has been taught about affects of unresolved pain on healing process/ pt is to be asked throughout the day about pain status   Skin   pt has unresolved incision site at lower anterior abdomial region/ hematoma in abdomiall region/ rectal area unresolved  surgeon is to remove sticthes from incision site and address new care for incision management/monitor area for signs and symptoms of infection and mointor patient for any worsening of hematoma areas  continued healing of before mentioned areas/ avoidance of any complations      *See Care Plan and progress notes for long and short-term goals.  Barriers to Discharge: BLE weakness as well as RUE    Possible Resolutions to Barriers:  Cont rehab, NM re ed    Discharge Planning/Teaching Needs:  Home  with girlfriend and son to provide assist, will need to move their schedules around for this to occur      Team Discussion:  Goals supervision-mod/i level. Pain issues-MD is managing. He has no restrictions besides back precautions. L-hip flexor weak. Started on aspirin with neuro approval. Son and fiance very supportive and here daily to provide support. Team recommends OP therapies at [facility]  Revisions to Treatment Plan:  DC 3/20   Continued Need for Acute  Rehabilitation Level of Care: The patient requires daily medical management by a physician with specialized training in physical medicine and rehabilitation for the following conditions: Daily direction of a multidisciplinary physical rehabilitation program to ensure safe treatment while eliciting the highest outcome that is of practical value to the patient.: Yes Daily medical management of patient stability for increased activity during participation in an intensive rehabilitation regime.: Yes Daily analysis of laboratory values and/or radiology reports with any subsequent need for medication adjustment of medical intervention for : Neurological problems  Darrell Watson, Darrell Watson 07/10/2016, 12:54 PM

## 2016-07-10 NOTE — Progress Notes (Signed)
Physical Therapy Session Note  Patient Details  Name: Darrell Watson MRN: [id number] Date of Birth: 1961-01-26  Today's Date: 07/10/2016 PT Individual Time: 1000-1100 PT Individual Time Calculation (min): 60 min   Short Term Goals: Week 1:  PT Short Term Goal 1 (Week 1): Pt will demonstrate bed mobility from flat bed without bedrails with S PT Short Term Goal 2 (Week 1): Pt will transfer stand pivot minA PT Short Term Goal 3 (Week 1): Pt will ambulate LRAD x50' with minA PT Short Term Goal 4 (Week 1): Pt will demonstrate ascent/descent 12 6-inch stairs with min guard  Skilled Therapeutic Interventions/Progress Updates:    no c/o pain.  Session focus on strengthening, NMR, and activity tolerance via ambulation, stair negotiation, and therex. Pt's son requesting to be signed off for transfers.  PT educated on ambulatory transfers to/from bathroom with RW and son returned demonstration.    Pt ambulates to and from therapy gym with close supervision.  BLE noted spasticity with swing through in 65% of opportunities, pt reports this is premorbid and does not appear to affect his balance negatively.  PT instructed pt in curb step negotiation x2 with RW and min assist to simulate home entry.  High level gait training through obstacle course x2 trials focus on stepping over and around obstacles.  Pt limited by R hip flexor weakness from hematoma when stepping over obstacles, provided education on modifying trajectory versus stepping around obstacles to reduce fall risk.  Pt requires min assist with high level ambulation.  LE therex focus on strengthening and activity tolerance 2x10 reps hip flexion and LAQ with 3# ankle weight.  Discussed with MD re: hematoma and physician states no restrictions on activity outside of back precautions.  Pt returned to room at end of session and positioned in w/c with call bell in reach and needs met.   Therapy Documentation Precautions:  Precautions Precautions: Fall,  Back Precaution Comments: able to report 3/3 precautions Required Braces or Orthoses: Spinal Brace Spinal Brace: Lumbar corset, Applied in sitting position Restrictions Weight Bearing Restrictions: Yes   See Function Navigator for Current Functional Status.   Therapy/Group: Individual Therapy  Darrell Watson 07/10/2016, 11:57 AM

## 2016-07-11 ENCOUNTER — Inpatient Hospital Stay (HOSPITAL_COMMUNITY): Payer: 59 | Admitting: Occupational Therapy

## 2016-07-11 ENCOUNTER — Inpatient Hospital Stay (HOSPITAL_COMMUNITY): Payer: Self-pay | Admitting: Physical Therapy

## 2016-07-11 ENCOUNTER — Inpatient Hospital Stay (HOSPITAL_COMMUNITY): Payer: 59 | Admitting: Speech Pathology

## 2016-07-11 LAB — BASIC METABOLIC PANEL
ANION GAP: 6 (ref 5–15)
BUN: 10 mg/dL (ref 6–20)
CALCIUM: 8.7 mg/dL — AB (ref 8.9–10.3)
CHLORIDE: 103 mmol/L (ref 101–111)
CO2: 28 mmol/L (ref 22–32)
CREATININE: 1.12 mg/dL (ref 0.61–1.24)
GFR calc non Af Amer: >60 mL/min (ref >60)
Glucose, Bld: 89 mg/dL (ref 65–99)
Potassium: 4.1 mmol/L (ref 3.5–5.1)
SODIUM: 137 mmol/L (ref 135–145)

## 2016-07-11 LAB — CBC
HCT: 35.5 % — ABNORMAL LOW (ref 39.0–52.0)
Hemoglobin: 11.2 g/dL — ABNORMAL LOW (ref 13.0–17.0)
MCH: 27.5 pg (ref 26.0–34.0)
MCHC: 31.5 g/dL (ref 30.0–36.0)
MCV: 87 fL (ref 78.0–100.0)
PLATELETS: 406 10*3/uL — AB (ref 150–400)
RBC: 4.08 MIL/uL — AB (ref 4.22–5.81)
RDW: 14.1 % (ref 11.5–15.5)
WBC: 6.6 10*3/uL (ref 4.0–10.5)

## 2016-07-11 NOTE — Progress Notes (Signed)
Subjective/Complaints: Slept well, discussed d/c date Some swelling  Right ankle  ROS- , no abd , no N/V , no CP or SOB  Objective: Vital Signs: Blood pressure 120/69, pulse 95, temperature 98.1 F (36.7 C), temperature source Oral, resp. rate 17, height '5\' 10"'$  (1.778 m), weight 89.1 kg (196 lb 8.5 oz), SpO2 100 %. No results found. Results for orders placed or performed during the hospital encounter of 07/05/16 (from the past 72 hour(s))  Basic metabolic panel     Status: Abnormal   Collection Time: 07/11/16  4:36 AM  Result Value Ref Range   Sodium 137 135 - 145 mmol/L   Potassium 4.1 3.5 - 5.1 mmol/L   Chloride 103 101 - 111 mmol/L   CO2 28 22 - 32 mmol/L   Glucose, Bld 89 65 - 99 mg/dL   BUN 10 6 - 20 mg/dL   Creatinine, Ser 1.12 0.61 - 1.24 mg/dL   Calcium 8.7 (L) 8.9 - 10.3 mg/dL   GFR calc non Af Amer >60 >60 mL/min   GFR calc Af Amer >60 >60 mL/min    Comment: (NOTE) The eGFR has been calculated using the CKD EPI equation. This calculation has not been validated in all clinical situations. eGFR's persistently <60 mL/min signify possible Chronic Kidney Disease.    Anion gap 6 5 - 15  CBC     Status: Abnormal   Collection Time: 07/11/16  4:36 AM  Result Value Ref Range   WBC 6.6 4.0 - 10.5 K/uL   RBC 4.08 (L) 4.22 - 5.81 MIL/uL   Hemoglobin 11.2 (L) 13.0 - 17.0 g/dL   HCT 35.5 (L) 39.0 - 52.0 %   MCV 87.0 78.0 - 100.0 fL   MCH 27.5 26.0 - 34.0 pg   MCHC 31.5 30.0 - 36.0 g/dL   RDW 14.1 11.5 - 15.5 %   Platelets 406 (H) 150 - 400 K/uL     HEENT: normal Cardio: RRR and no murmur Resp: CTA B/L and unlabored GI: BS positive and NT, ND Extremity:  Pulses positive and No Edema Skin:   Intact Neuro: Alert/Oriented, Flat, Normal Sensory, Abnormal Motor 4/5 in Right delt bi, tri, grip HF, KE , ADF, 4/5 L HF, grip, 5/5 in remaining muscle groups on Left side and Abnormal FMC Ataxic/ dec FMC Musc/Skel:  Other PIP amp digits 3,4,5 Gen NAD   Assessment/Plan: 1.  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 Physiatrist is providing close team supervision and 24 hour management of active medical problems listed below. Physiatrist and rehab team continue to assess barriers to discharge/monitor patient progress toward functional and medical goals. FIM: Function - Bathing Position: Shower Body parts bathed by patient: Right arm, Left arm, Chest, Abdomen, Front perineal area, Buttocks, Right upper leg, Left upper leg, Right lower leg, Left lower leg Body parts bathed by helper: Back Assist Level: Touching or steadying assistance(Pt > 75%)  Function- Upper Body Dressing/Undressing What is the patient wearing?: Orthosis Pull over shirt/dress - Perfomed by patient: Thread/unthread right sleeve, Thread/unthread left sleeve, Put head through opening, Pull shirt over trunk Orthosis activity level: Performed by patient Assist Level: Supervision or verbal cues Function - Lower Body Dressing/Undressing What is the patient wearing?: [employer], Shoes Underwear - Performed by patient: Thread/unthread right underwear leg, Thread/unthread left underwear leg, Pull underwear up/down Pants- Performed by patient: Thread/unthread  right pants leg, Thread/unthread left pants leg, Pull pants up/down Non-skid slipper socks- Performed by helper: Don/doff right sock, Don/doff left sock Shoes - Performed by patient: Fasten right, Fasten left Shoes - Performed by helper: Don/doff right shoe, Don/doff left shoe TED Hose - Performed by helper: Don/doff right TED hose, Don/doff left TED hose Assist for footwear: Partial/moderate assist Assist for lower body dressing: Touching or steadying assistance (Pt > 75%)  Function - Toileting Toileting steps completed by patient: Performs perineal  hygiene Toileting steps completed by helper: Adjust clothing prior to toileting, Performs perineal hygiene, Adjust clothing after toileting (per Candice, NT report) Toileting Assistive Devices: Grab bar or rail Assist level: Touching or steadying assistance (Pt.75%)  Function - Air cabin crew transfer assistive device: Elevated toilet seat/BSC over toilet Assist level to toilet: Touching or steadying assistance (Pt > 75%) Assist level from toilet: Touching or steadying assistance (Pt > 75%)  Function - Chair/bed transfer Chair/bed transfer method: Stand pivot Chair/bed transfer assist level: Moderate assist (Pt 50 - 74%/lift or lower) Chair/bed transfer assistive device: Armrests Chair/bed transfer details: Manual facilitation for weight shifting, Verbal cues for precautions/safety, Verbal cues for technique, Manual facilitation for placement  Function - Locomotion: Wheelchair Will patient use wheelchair at discharge?: No Type: Manual Max wheelchair distance: 150 Assist Level: Touching or steadying assistance (Pt > 75%) Assist Level: Touching or steadying assistance (Pt > 75%) Assist Level: Touching or steadying assistance (Pt > 75%) Turns around,maneuvers to table,bed, and toilet,negotiates 3% grade,maneuvers on rugs and over doorsills: No Function - Locomotion: Ambulation Assistive device: Walker-rolling Max distance: 150 Assist level: Moderate assist (Pt 50 - 74%) Assist level: Moderate assist (Pt 50 - 74%) Assist level: Moderate assist (Pt 50 - 74%) Assist level: Moderate assist (Pt 50 - 74%) Assist level: Moderate assist (Pt 50 - 74%)  Function - Comprehension Comprehension: Auditory Comprehension assist level: Follows basic conversation/direction with extra time/assistive device  Function - Expression Expression: Verbal Expression assist level: Expresses basic needs/ideas: With no assist  Function - Social Interaction Social Interaction assist level: Interacts  appropriately with others with medication or extra time (anti-anxiety, antidepressant).  Function - Problem Solving Problem solving assist level: Solves complex 90% of the time/cues < 10% of the time  Function - Memory Memory assist level: Recognizes or recalls 90% of the time/requires cueing < 10% of the time Patient normally able to recall (first 3 days only): Current season, Location of own room, Staff names and faces, That he or she is in a hospital  1. 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  CIR PT, OT, SLP  - Back brace when out of bed - mild L hip flexor weakness from psoas hematoma recheck cbc Hgb improving to 11.2 2. DVT Prophylaxis/Anticoagulation: Subcutaneous Lovenox. Monitor platelet counts (362K on 3/9)  of any signs of bleeding. Venous Doppler study negative  ASA '81mg'$  started for CVA prophyllaxis, SCD for DVT prevention 3. Pain Management: Oxycodone and Robaxin as needed  -consider pre-treating prior to therapies  4. Mood: Provide emotional support  5. Neuropsych: This patient is capable of making decisions on his own behalf.  6. Skin/Wound Care: Routine skin checks  7. Fluids/Electrolytes/Nutrition: Routine I&O with follow-up chemistries , HypoK- , KCL supplement recheck BMET normal 8. Acute blood loss anemia due to hematoma/post-op. Follow serial Hgb--9.9 > 10.5> 11.2  on 3/12 .  Improving no  further w/u at this time 9. Constipation. Laxative assistance   LOS (Days) 6 A FACE TO FACE EVALUATION WAS PERFORMED  KIRSTEINS,ANDREW E 07/11/2016, 7:09 AM

## 2016-07-11 NOTE — Progress Notes (Signed)
Speech Language Pathology Daily Session Note  Patient Details  Name: Darrell Watson MRN: [id number] Date of Birth: 06/04/1960  Today's Date: 07/11/2016 SLP Individual Time: 1120-1200 SLP Individual Time Calculation (min): 40 min  Short Term Goals: Week 1: SLP Short Term Goal 1 (Week 1): Pt will utilize external memory aids for recall of new information with supervision cues.  SLP Short Term Goal 2 (Week 1): Pt will complete complex money management tasks with supervision cues.  SLP Short Term Goal 3 (Week 1): Pt will complete complex medication management tasks with supervision cues.  SLP Short Term Goal 4 (Week 1): Pt will demonstrate selective attention in moderately distracting environment with supervision cues for redirection to tasks.  SLP Short Term Goal 5 (Week 1): Pt will demonstrate anticipatory awareness by listing 3 activites that are safe to participate in at home or work with supervision cues.   Skilled Therapeutic Interventions: Skilled treatment session focused on cognitive goals. SLP facilitated session by administering the MoCA (version 7.3). Patient scored 24/30 points with a score of 26 or above considered normal. Patient demonstrated difficulty with mathematical problems and delayed recall. However, patient reports this is baseline since concussion ~4 years ago. Patient can currently recall functional information and demonstrates complex problem solving with functional and familiar tasks with Mod I. All SLP goals have been met, therefore, patient will be discharged from skilled SLP intervention. Patient verbalized understanding and agreement. Patient left upright in wheelchair with all needs within reach. Continue with current plan of care.      Function:  Cognition Comprehension Comprehension assist level: Follows basic conversation/direction with no assist  Expression   Expression assist level: Expresses basic needs/ideas: With no assist  Social Interaction Social  Interaction assist level: Interacts appropriately with others with medication or extra time (anti-anxiety, antidepressant).  Problem Solving Problem solving assist level: Solves complex problems: With extra time  Memory Memory assist level: More than reasonable amount of time    Pain No/Denies Pain   Therapy/Group: Individual Therapy  Kailer Heindel 07/11/2016, 4:26 PM

## 2016-07-11 NOTE — Progress Notes (Signed)
Occupational Therapy Session Note  Patient Details  Name: Darrell Watson MRN: [id number] Date of Birth: [birth year]  Today's Date: 07/11/2016 OT Individual Time: 6943-7005 OT Individual Time Calculation (min): 60 min    Short Term Goals: Week 1:  OT Short Term Goal 1 (Week 1): Pt will complete sit<stand during LB dressing with Min A OT Short Term Goal 2 (Week 1): Pt will complete toilet transfer with Min A and LRAD OT Short Term Goal 3 (Week 1): Pt will complete 1 grooming task in standing with Min A for balance  OT Short Term Goal 4 (Week 1): Pt will complete tub bench transfer with Min A and LRAD      Skilled Therapeutic Interventions/Progress Updates:    Pt seen for BADL retraining with a focus on adaptive strategies to maintain back precautions for shower and dressing. Pt completed sit ><stands to RW with close S and ambulated in room, to bathroom, and later 50 ft in hallway with RW with steadying A on his hips. Pt transferred into shower and then was able to bathe with set up only.  He crossed legs to wash feet and used lateral leans to wash buttocks.  Donned shirt then orthosis from bench, then stood to pull pants over hips.  Excellent balance with UE support on RW.  Assisted pt with TEDS. To don R shoe with AFO, used funnel shoe horn in shoe, pt crossed leg and placed foot in 3/4 of the way.  He then pushed his foot down into shoe using shoe funnel with his foot on the floor. Pt completed session with short walk in the hallway. Pt maintained back precautions well.  Pt in w/c with all needs met.    Therapy Documentation Precautions:  Precautions Precautions: Fall, Back Precaution Comments: able to report 3/3 precautions Required Braces or Orthoses: Spinal Brace Spinal Brace: Lumbar corset, Applied in sitting position Restrictions Weight Bearing Restrictions: Yes    Vital Signs: Therapy Vitals Temp: 98.1 F (36.7 C) Temp Source: Oral Pulse Rate: 95 Resp: 17 BP: 120/69 Patient  Position (if appropriate): Lying Oxygen Therapy SpO2: 100 % O2 Device: Not Delivered Pain: Pain Assessment Pain Assessment: No/denies pain ADL: ADL ADL Comments: Please see functional navigator for ADL status  See Function Navigator for Current Functional Status.   Therapy/Group: Individual Therapy  [place] 07/11/2016, 8:48 AM

## 2016-07-11 NOTE — Progress Notes (Signed)
    Subjective:    Patient reports pain as 0 on 0-10 scale.   Denies CP or SOB.  Voiding without difficulty. Positive flatus. Reports decreased pain in left lower extremity.   Pt still has residual weakness in the RLE from CVA but says it is improving Objective: Vital signs in last 24 hours: Temp:  [98.1 F (36.7 C)] 98.1 F (36.7 C) (03/15 0520) Pulse Rate:  [90-95] 95 (03/15 0520) Resp:  [17-18] 17 (03/15 0520) BP: (120-131)/(68-69) 120/69 (03/15 0520) SpO2:  [100 %] 100 % (03/15 0520)  Intake/Output from previous day: 03/14 0701 - 03/15 0700 In: 600 [P.O.:600] Out: -  Intake/Output this shift: No intake/output data recorded.  Labs:  Recent Labs  07/11/16 0436  HGB 11.2*    Recent Labs  07/11/16 0436  WBC 6.6  RBC 4.08*  HCT 35.5*  PLT 406*    Recent Labs  07/11/16 0436  NA 137  K 4.1  CL 103  CO2 28  BUN 10  CREATININE 1.12  GLUCOSE 89  CALCIUM 8.7*   No results for input(s): LABPT, INR in the last 72 hours.  Physical Exam: ABD soft Sensation intact distally Dorsiflexion/Plantar flexion intact Incision: removed sutures, placed steri strips and new bandage Compartment soft  Assessment/Plan:    Continue treatment in Inpatient rehab Pt is supposed to go home next Tuesday Told him to contact our office when he goes home and we will make him an appt to come into clinic.   Magic Mohler, Baxter Kailarmen Christina for Dr[facility] [phone] 07/11/2016, 12:54 PM    Patient ID: Darrell Watson, male   DOB: 03-Oct-1960, 56 y.o.   MRN: [id number]

## 2016-07-11 NOTE — Progress Notes (Signed)
Orthopedic Tech Progress Note Patient Details:  Esperanza HeirOllis M Paras [birth year] [id number]  Patient ID: Esperanza Heirllis M Carles, male   DOB: [birth year], 56 y.o.   MRN: [id number]   Saul FordyceJennifer C Solymar Grace 07/11/2016, 2:54 PMCalled [employer] for right AFO.

## 2016-07-11 NOTE — Progress Notes (Signed)
Physical Therapy Session Note  Patient Details  Name: Darrell Watson MRN: [id number] Date of Birth: 1961-02-09  Today's Date: 07/11/2016 PT Individual Time: 1345-1445 and 1530-1600 PT Individual Time Calculation (min): 60 min and 30 min   Short Term Goals: Week 1:  PT Short Term Goal 1 (Week 1): Pt will demonstrate bed mobility from flat bed without bedrails with S PT Short Term Goal 2 (Week 1): Pt will transfer stand pivot minA PT Short Term Goal 3 (Week 1): Pt will ambulate LRAD x50' with minA PT Short Term Goal 4 (Week 1): Pt will demonstrate ascent/descent 12 6-inch stairs with min guard  Skilled Therapeutic Interventions/Progress Updates:  Session 1:    no c/o pain but reports fatigue today.  Session focus on ambulation, activity tolerance, strengthening, and balance.   Pt ambulates to and from therapy gym with supervision and RW.  Occasional spasticity noted in LLE during swing phase.  Orthotic consult with [employer] Rep to determine appropriate bracing system for pt.  Pt performs LE strengthening exercises, 10 reps of seated marching, LAQ, long sitting quad set, hip abd, plus AAROM side lying abduction x5 reps.  Pt performs 2 trials of static standing on foam for ankle strategy and hip strategy with occasional LOB posteriorly.  Pt performs alternating toe taps to 4" box x4 reps + 8 reps with bilat HHA.  Pt returned to room at end of session and positioned upright in w/c with call bell in reach and needs met.  Session 2: No c/o pain.  Session focus on gait and activity tolerance.  Pt ambulates to and from therapy gym with distant supervision.  Noted R circumduction in swing phase without shoes (taken by orthotist for toe cap).  Pt completes 10 minutes on nustep at level 3 with 4 extremities for reciprocal stepping pattern retraining, forced use, and activity tolerance. Pt returned to room at end of session and positioned in w/c with call bell in reach and needs met.   Therapy  Documentation Precautions:  Precautions Precautions: Fall, Back Precaution Comments: able to report 3/3 precautions Required Braces or Orthoses: Spinal Brace Spinal Brace: Lumbar corset, Applied in sitting position Restrictions Weight Bearing Restrictions: No   See Function Navigator for Current Functional Status.   Therapy/Group: Individual Therapy  Earnest Conroy Penven-Crew 07/11/2016, 4:27 PM

## 2016-07-12 ENCOUNTER — Inpatient Hospital Stay (HOSPITAL_COMMUNITY): Payer: 59 | Admitting: Occupational Therapy

## 2016-07-12 ENCOUNTER — Inpatient Hospital Stay (HOSPITAL_COMMUNITY): Payer: Self-pay

## 2016-07-12 ENCOUNTER — Inpatient Hospital Stay (HOSPITAL_COMMUNITY): Payer: Self-pay | Admitting: Physical Therapy

## 2016-07-12 LAB — CREATININE, SERUM: Creatinine, Ser: 1.16 mg/dL (ref 0.61–1.24)

## 2016-07-12 NOTE — Progress Notes (Signed)
Physical Therapy Session Note  Patient Details  Name: Darrell Watson MRN: [id number] Date of Birth: 07/19/1960  Today's Date: 07/12/2016 PT Individual Time: 1415-1515 PT Individual Time Calculation (min): 60 min   Short Term Goals: Week 1:  PT Short Term Goal 1 (Week 1): Pt will demonstrate bed mobility from flat bed without bedrails with S PT Short Term Goal 2 (Week 1): Pt will transfer stand pivot minA PT Short Term Goal 3 (Week 1): Pt will ambulate LRAD x50' with minA PT Short Term Goal 4 (Week 1): Pt will demonstrate ascent/descent 12 6-inch stairs with min guard  Skilled Therapeutic Interventions/Progress Updates:    no c/o pain, but does report ongoing fatigue.  Session focus on strengthening, activity tolerance, and balance during high level ambulation.   Pt ambulates to and from therapy gym with distant supervision, occasional catch of RLE due to weak hip flexion but improved with toe cap from orthotist.  UE therex with green/blue theraband 2x15 reps bicep/tricep curls.  Pt limited with shoulder therex 2/2 chronic shoulder pain and surgeries.  PT instructed pt in high level ambulation through obstacle course focus on stepping over and around obstacles with supervision.  Pt able to clear yoga block on floor today with RLE advancing first with no contact, which is an improvement from previous attempt earlier in the week.  Stair negotiation 2x4 steps with 2 rails, alternating pattern for strengthening, balance, and increased step length, with min assist for safety.  Pt returned to room at end of session and positioned in w/c with call bell in reach and needs met.   Therapy Documentation Precautions:  Precautions Precautions: Fall, Back Precaution Comments: able to report 3/3 precautions Required Braces or Orthoses: Spinal Brace Spinal Brace: Lumbar corset, Applied in sitting position Restrictions Weight Bearing Restrictions: No   See Function Navigator for Current Functional  Status.   Therapy/Group: Individual Therapy  Caitlin E Penven-Crew 07/12/2016, 4:44 PM  

## 2016-07-12 NOTE — Progress Notes (Signed)
Occupational Therapy Session Note  Patient Details  Name: Darrell Watson M Moskowitz MRN: [id number] Date of Birth: 09/04/1960  Today's Date: 07/12/2016 OT Individual Time: 0915-1000 and 1345-1415 OT Individual Time Calculation (min): 45 min and 30 min   Short Term Goals: Week 1:  OT Short Term Goal 1 (Week 1): Pt will complete sit<stand during LB dressing with Min A OT Short Term Goal 2 (Week 1): Pt will complete toilet transfer with Min A and LRAD OT Short Term Goal 3 (Week 1): Pt will complete 1 grooming task in standing with Min A for balance  OT Short Term Goal 4 (Week 1): Pt will complete tub bench transfer with Min A and LRAD  Skilled Therapeutic Interventions/Progress Updates:    1) Treatment session with focus on LB dressing techniques and functional mobility in home environment.  Pt donned shoes with setup assist, crossing legs over opposite knee.  Pt able to don with increased time and no use of AD this session.  Ambulated to ADL apt with RW and min guard to close supervision.  Engaged in simulated simple meal prep in kitchen with focus on safety with RW and problem solving transporting items.  Educated pt on various strategies including sliding items on counter top and use of RW bag to transport items.  Issued pt RW bag and demonstrated transporting items from kitchen to "living room" with increased safety.  2) Treatment session with focus on functional mobility, trunk control, and RUE gross motor control.  Pt ambulated to therapy gym with RW with min guard - supervision.  Engaged in sit > stand from low surface with focus on increased motor control.  Increased challenge to complete sit > stand with BUE on medicine ball to increase challenge on BLE and trunk control.  Ball toss and single handed ball bounce in sitting with focus on RUE coordination and motor control.  Returned to room as above and left seated in w/c with all needs in reach.  Therapy Documentation Precautions:   Precautions Precautions: Fall, Back Precaution Comments: able to report 3/3 precautions Required Braces or Orthoses: Spinal Brace Spinal Brace: Lumbar corset, Applied in sitting position Restrictions Weight Bearing Restrictions: No Pain:  Pt with no c/o pain  See Function Navigator for Current Functional Status.   Therapy/Group: Individual Therapy  [name], Challen Spainhour 07/12/2016, 12:13 PM

## 2016-07-12 NOTE — Progress Notes (Signed)
Occupational Therapy Session Note  Patient Details  Name: JES COSTALES MRN: [id number] Date of Birth: [birth year]  Today's Date: 07/12/2016 OT Individual Time: 11:00-11:58 0OT Individual Time Calculation (min): 30 min    Short Term Goals: Week 1:  OT Short Term Goal 1 (Week 1): Pt will complete sit<stand during LB dressing with Min A OT Short Term Goal 2 (Week 1): Pt will complete toilet transfer with Min A and LRAD OT Short Term Goal 3 (Week 1): Pt will complete 1 grooming task in standing with Min A for balance  OT Short Term Goal 4 (Week 1): Pt will complete tub bench transfer with Min A and LRAD  Skilled Therapeutic Interventions/Progress Updates:    Pt seated in w/c upon arrival with son present, reporting no pain and agreeable to tx. Pt propels w/c with VC to maintain straight path d/t RUE weakness. In ADL apartment, pt ambulates with steady assist to sit EOB<>supine with CGA. Pt able to verbalize precautions and appropriately log roll/push up on forearm with VC for technique. Pt ambulates to walk in shower and steps over ledge x3 while holding onto a grab bar with supervision and VC for foot placement/R foot clearance. Pt demo difficulty with R foot drop. In therapy gym to improve functional mobility in/out of shower and R foot clearance, Pt side steps 4x the length of the mat with increased time and Vc for RW mangement and R knee flexion. Pt stands to cross midline without twisting to obtain/toss horseshoe from above shoulder level to improve standing balance, tolerance and endurance. Pt completes 5 sit to stands each leg with one foot elevated on 5 inch block to improve weight shifting and increase strength in BLE for functional mobility. Pt ambulates to day room and sits on couch with supervision and VC for step through pattern. Pt requires MOD A for lifting from low couch to stand. While seated in mat, pt dons B ted hose and shoes with VC to use plastic to slip on ted hose and maintain  bending precautions by using reacher to grab shoes off of floor. Pt returned to room seated in w/c with all needs met and call light in reach.   Therapy Documentation Precautions:  Precautions Precautions: Fall, Back Precaution Comments: able to report 3/3 precautions Required Braces or Orthoses: Spinal Brace Spinal Brace: Lumbar corset, Applied in sitting position Restrictions Weight Bearing Restrictions: No    ADL: ADL ADL Comments: Please see functional navigator for ADL status  See Function Navigator for Current Functional Status.   Therapy/Group: Individual Therapy  Tonny Branch 07/12/2016, 5:15 PM

## 2016-07-12 NOTE — Progress Notes (Signed)
Speech Language Pathology Discharge Summary  Patient Details  Name: Darrell Watson MRN: [id number] Date of Birth: 09-Nov-1960   Patient has met 5 of 5 long term goals.  Patient to discharge at overall Modified Independent level.  Reasons goals not met: N/A    Clinical Impression/Discharge Summary:   Patient has made excellent gains and has met 5 of 5 LTG's this admission. Currently, patient is 100% intelligible at the conversation level with Mod I for use of speech intelligibility strategies. Patient is also overall Mod I to complete mildly complex and functional tasks safely in regards to problem solving, recall, attention and awareness. Patient education is complete. Due to patient's progress, patient will be discharged from skilled SLP intervention and f/u is not warranted at this time. Patient verbalized understanding and agreement.    Recommendation:  None      Equipment: N/A  Reasons for discharge: Treatment goals met   Patient/Family Agrees with Progress Made and Goals Achieved: Yes     [place], [place] 07/12/2016, 6:40 AM

## 2016-07-12 NOTE — Progress Notes (Signed)
Subjective/Complaints:  Ortho removed sutures yesterday  ROS- , no abd , no N/V , no CP or SOB  Objective: Vital Signs: Blood pressure 104/67, pulse 97, temperature 98.3 F (36.8 C), temperature source Oral, resp. rate 18, height '5\' 10"'$  (1.778 m), weight 89.1 kg (196 lb 8.5 oz), SpO2 100 %. No results found. Results for orders placed or performed during the hospital encounter of 07/05/16 (from the past 72 hour(s))  Basic metabolic panel     Status: Abnormal   Collection Time: 07/11/16  4:36 AM  Result Value Ref Range   Sodium 137 135 - 145 mmol/L   Potassium 4.1 3.5 - 5.1 mmol/L   Chloride 103 101 - 111 mmol/L   CO2 28 22 - 32 mmol/L   Glucose, Bld 89 65 - 99 mg/dL   BUN 10 6 - 20 mg/dL   Creatinine, Ser 1.12 0.61 - 1.24 mg/dL   Calcium 8.7 (L) 8.9 - 10.3 mg/dL   GFR calc non Af Amer >60 >60 mL/min   GFR calc Af Amer >60 >60 mL/min    Comment: (NOTE) The eGFR has been calculated using the CKD EPI equation. This calculation has not been validated in all clinical situations. eGFR's persistently <60 mL/min signify possible Chronic Kidney Disease.    Anion gap 6 5 - 15  CBC     Status: Abnormal   Collection Time: 07/11/16  4:36 AM  Result Value Ref Range   WBC 6.6 4.0 - 10.5 K/uL   RBC 4.08 (L) 4.22 - 5.81 MIL/uL   Hemoglobin 11.2 (L) 13.0 - 17.0 g/dL   HCT 35.5 (L) 39.0 - 52.0 %   MCV 87.0 78.0 - 100.0 fL   MCH 27.5 26.0 - 34.0 pg   MCHC 31.5 30.0 - 36.0 g/dL   RDW 14.1 11.5 - 15.5 %   Platelets 406 (H) 150 - 400 K/uL  Creatinine, serum     Status: None   Collection Time: 07/12/16  6:20 AM  Result Value Ref Range   Creatinine, Ser 1.16 0.61 - 1.24 mg/dL   GFR calc non Af Amer >60 >60 mL/min   GFR calc Af Amer >60 >60 mL/min    Comment: (NOTE) The eGFR has been calculated using the CKD EPI equation. This calculation has not been validated in all clinical situations. eGFR's persistently <60 mL/min signify possible Chronic Kidney Disease.      HEENT:  normal Cardio: RRR and no murmur Resp: CTA B/L and unlabored GI: BS positive and NT, ND Extremity:  Pulses positive and No Edema Skin:   Intact Neuro: Alert/Oriented, Flat, Normal Sensory, Abnormal Motor 4/5 in Right delt bi, tri, grip HF, KE , ADF, 4/5 L HF, grip, 5/5 in remaining muscle groups on Left side and Abnormal FMC Ataxic/ dec FMC Musc/Skel:  Other PIP amp digits 3,4,5 Gen NAD   Assessment/Plan: 1. 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 Physiatrist is providing close team supervision and 24 hour management of active medical problems listed below. Physiatrist and rehab team continue to assess barriers to discharge/monitor patient progress toward functional and medical goals. FIM: Function - Bathing Position: Shower Body parts bathed by patient: Right arm, Left arm, Chest, Abdomen, Front perineal area, Buttocks, Right upper leg, Left upper leg, Right lower leg, Left lower leg Body parts bathed by helper: Back Assist Level: Set up Set up :  To obtain items  Function- Upper Body Dressing/Undressing What is the patient wearing?: Pull over shirt/dress, Orthosis Pull over shirt/dress - Perfomed by patient: Thread/unthread right sleeve, Thread/unthread left sleeve, Put head through opening, Pull shirt over trunk Orthosis activity level: Performed by patient Assist Level: More than reasonable time (pt had clothing set up already) Function - Lower Body Dressing/Undressing What is the patient wearing?: Underwear, Pants, Ted Hose, AFO, Shoes Position: Wheelchair/chair at [employer] - Performed by patient: Thread/unthread right underwear leg, Thread/unthread left underwear leg, Pull underwear up/down Pants- Performed by patient: Thread/unthread right pants leg, Thread/unthread left pants leg,  Pull pants up/down Non-skid slipper socks- Performed by helper: Don/doff right sock, Don/doff left sock Shoes - Performed by patient: Don/doff left shoe, Don/doff right shoe Shoes - Performed by helper: Don/doff right shoe, Don/doff left shoe AFO - Performed by patient: Don/doff right AFO TED Hose - Performed by helper: Don/doff right TED hose, Don/doff left TED hose Assist for footwear: Partial/moderate assist Assist for lower body dressing: Supervision or verbal cues  Function - Toileting Toileting steps completed by patient: Adjust clothing prior to toileting, Performs perineal hygiene Toileting steps completed by helper: Adjust clothing after toileting Toileting Assistive Devices: Grab bar or rail Assist level: Touching or steadying assistance (Pt.75%)  Function - Air cabin crew transfer assistive device: Elevated toilet seat/BSC over toilet Assist level to toilet: Touching or steadying assistance (Pt > 75%) Assist level from toilet: Touching or steadying assistance (Pt > 75%)  Function - Chair/bed transfer Chair/bed transfer method: Stand pivot Chair/bed transfer assist level: Moderate assist (Pt 50 - 74%/lift or lower) Chair/bed transfer assistive device: Armrests Chair/bed transfer details: Manual facilitation for weight shifting, Verbal cues for precautions/safety, Verbal cues for technique, Manual facilitation for placement  Function - Locomotion: Wheelchair Will patient use wheelchair at discharge?: No Type: Manual Max wheelchair distance: 150 Assist Level: Touching or steadying assistance (Pt > 75%) Assist Level: Touching or steadying assistance (Pt > 75%) Assist Level: Touching or steadying assistance (Pt > 75%) Turns around,maneuvers to table,bed, and toilet,negotiates 3% grade,maneuvers on rugs and over doorsills: No Function - Locomotion: Ambulation Assistive device: Walker-rolling Max distance: 150 Assist level: Moderate assist (Pt 50 - 74%) Assist  level: Moderate assist (Pt 50 - 74%) Assist level: Moderate assist (Pt 50 - 74%) Assist level: Moderate assist (Pt 50 - 74%) Assist level: Moderate assist (Pt 50 - 74%)  Function - Comprehension Comprehension: Auditory Comprehension assist level: Follows basic conversation/direction with no assist  Function - Expression Expression: Verbal Expression assist level: Expresses basic needs/ideas: With no assist  Function - Social Interaction Social Interaction assist level: Interacts appropriately with others with medication or extra time (anti-anxiety, antidepressant).  Function - Problem Solving Problem solving assist level: Solves complex problems: With extra time  Function - Memory Memory assist level: More than reasonable amount of time Patient normally able to recall (first 3 days only): Current season, Location of own room, Staff names and faces, That he or she is in a hospital  1. 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  CIR PT, OT, SLP  - Back brace when out of bed - mild L hip flexor weakness from psoas hematoma recheck cbc Hgb improving to 11.2 2. DVT Prophylaxis/Anticoagulation: Subcutaneous Lovenox. Monitor platelet counts (362K on 3/9)  of any signs of bleeding. Venous Doppler study negative  ASA '81mg'$  started for  CVA prophyllaxis, SCD for DVT prevention 3. Pain Management: Oxycodone and Robaxin as needed  -consider pre-treating prior to therapies  4. Mood: Provide emotional support  5. Neuropsych: This patient is capable of making decisions on his own behalf.  6. Skin/Wound Care: Routine skin checks  7. Fluids/Electrolytes/Nutrition: Routine I&O with follow-up chemistries , HypoK- , KCL supplement recheck BMET normal 8. Acute blood loss anemia due to hematoma/post-op. Follow serial Hgb--9.9 >  10.5> 11.2  on 3/12 .  Improving no further w/u at this time 9. Constipation. Laxative assistance   LOS (Days) 7 A FACE TO FACE EVALUATION WAS PERFORMED  Lorne Winkels E 07/12/2016, 7:13 AM

## 2016-07-13 ENCOUNTER — Inpatient Hospital Stay (HOSPITAL_COMMUNITY): Payer: Self-pay

## 2016-07-13 NOTE — Progress Notes (Signed)
Occupational Therapy Session Note  Patient Details  Name: Darrell Watson MRN: [id number] Date of Birth: December 29, 1960  Today's Date: 07/13/2016 OT Individual Time: 1430-1530 OT Individual Time Calculation (min): 60 min   Short Term Goals: Week 1:  OT Short Term Goal 1 (Week 1): Pt will complete sit<stand during LB dressing with Min A OT Short Term Goal 2 (Week 1): Pt will complete toilet transfer with Min A and LRAD OT Short Term Goal 3 (Week 1): Pt will complete 1 grooming task in standing with Min A for balance  OT Short Term Goal 4 (Week 1): Pt will complete tub bench transfer with Min A and LRAD  Skilled Therapeutic Interventions/Progress Updates: ADL-retraining with focus on lower body dressing skills, specifically donning right AFO.   Pt received seated in his w/c with S.O. Present.  Pt reports independence with BADL except donning AFO.   OT requests pt demonstrate his technique to advise on alternate strategy.   Pt is unable to maintain back precautions during session despite trial use of step stool, dicem to stablize, LH shoe horn, 2-step method (AFO first then shoe), and use of size 11 shoe (Pt wears size 10).   Pt and S.O., agree to plan to investigate alternate AFO or adjust work/BADL schedule to allow S.O. To assist pt with donning AFO.        Therapy Documentation Precautions:  Precautions Precautions: Fall, Back Precaution Comments: able to report 3/3 precautions Required Braces or Orthoses: Spinal Brace Spinal Brace: Lumbar corset, Applied in sitting position Restrictions Weight Bearing Restrictions: No   Pain: No/denies     ADL: ADL ADL Comments: Please see functional navigator for ADL status   See Function Navigator for Current Functional Status.   Therapy/Group: Individual Therapy  [name] 07/13/2016, 3:37 PM

## 2016-07-13 NOTE — Progress Notes (Signed)
Darrell Watson is a 56 y.o. male 1960/12/18 [id number]  Subjective: No new complaints. No new problems. Slept well. Feeling OK.  Objective: Vital signs in last 24 hours: Temp:  [98 F (36.7 C)-98.7 F (37.1 C)] 98.7 F (37.1 C) (03/17 0500) Pulse Rate:  [98] 98 (03/17 0500) Resp:  [18] 18 (03/17 0500) BP: (115-127)/(53-75) 115/53 (03/17 0500) SpO2:  [100 %] 100 % (03/17 0500) Weight change:  Last BM Date: 07/11/16  Intake/Output from previous day: 03/16 0701 - 03/17 0700 In: 240 [P.O.:240] Out: 250 [Urine:250] Last cbgs: CBG (last 3)  No results for input(s): GLUCAP in the last 72 hours.   Physical Exam General: No apparent distress   HEENT: not dry Lungs: Normal effort. Lungs clear to auscultation, no crackles or wheezes. Cardiovascular: Regular rate and rhythm, no edema Abdomen: S/NT/ND; BS(+) Musculoskeletal:  unchanged Neurological: No new neurological deficits Wounds: N/A    Skin: clear  Aging changes Mental state: Alert, oriented, cooperative In a w/c    Lab Results: BMET    Component Value Date/Time   NA 137 07/11/2016 0436   NA 137 03/05/2012 1351   K 4.1 07/11/2016 0436   K 4.0 03/05/2012 1351   CL 103 07/11/2016 0436   CL 104 03/05/2012 1351   CO2 28 07/11/2016 0436   CO2 28 03/05/2012 1351   GLUCOSE 89 07/11/2016 0436   GLUCOSE 79 03/05/2012 1351   BUN 10 07/11/2016 0436   BUN 13.0 03/05/2012 1351   CREATININE 1.16 07/12/2016 0620   CREATININE 1.4 (H) 03/05/2012 1351   CALCIUM 8.7 (L) 07/11/2016 0436   CALCIUM 9.6 03/05/2012 1351   GFRNONAA >60 07/12/2016 0620   GFRAA >60 07/12/2016 0620   CBC    Component Value Date/Time   WBC 6.6 07/11/2016 0436   RBC 4.08 (L) 07/11/2016 0436   HGB 11.2 (L) 07/11/2016 0436   HGB 13.3 07/28/2012 1411   HCT 35.5 (L) 07/11/2016 0436   HCT 41.6 07/28/2012 1411   PLT 406 (H) 07/11/2016 0436   PLT 265 07/28/2012 1411   MCV 87.0 07/11/2016 0436   MCV 83.9 07/28/2012 1411   MCH 27.5 07/11/2016 0436   MCHC 31.5 07/11/2016 0436   RDW 14.1 07/11/2016 0436   RDW 14.0 07/28/2012 1411   LYMPHSABS 1.2 07/08/2016 0412   LYMPHSABS 1.4 07/28/2012 1411   MONOABS 1.1 (H) 07/08/2016 0412   MONOABS 0.6 07/28/2012 1411   EOSABS 0.5 07/08/2016 0412   EOSABS 0.6 (H) 07/28/2012 1411   BASOSABS 0.0 07/08/2016 0412   BASOSABS 0.1 07/28/2012 1411    Studies/Results: No results found.  Medications: I have reviewed the patient's current medications.  Assessment/Plan:  1. L embolic CV --- on ASA 2. R hemiparesis -- CIR 3. DVT proph - Lovenox, monitor PLT count 4. Pain -- Oxycodone prn 5. Constipation - LOC   Length of stay, days: 8  Sonda PrimesAlex Plotnikov , MD 07/13/2016, 3:28 PM

## 2016-07-14 ENCOUNTER — Inpatient Hospital Stay (HOSPITAL_COMMUNITY): Payer: 59 | Admitting: Physical Therapy

## 2016-07-14 ENCOUNTER — Inpatient Hospital Stay (HOSPITAL_COMMUNITY): Payer: Self-pay

## 2016-07-14 NOTE — Progress Notes (Signed)
Physical Therapy Session Note  Patient Details  Name: Darrell Watson MRN: [id number] Date of Birth: 07/22/1960  Today's Date: 07/14/2016 PT Individual Time: 0806-0901 PT Individual Time Calculation (min): 55 min   Short Term Goals: Week 1:  PT Short Term Goal 1 (Week 1): Pt will demonstrate bed mobility from flat bed without bedrails with S PT Short Term Goal 2 (Week 1): Pt will transfer stand pivot minA PT Short Term Goal 3 (Week 1): Pt will ambulate LRAD x50' with minA PT Short Term Goal 4 (Week 1): Pt will demonstrate ascent/descent 12 6-inch stairs with min guard  Skilled Therapeutic Interventions/Progress Updates:  Pt received in room with fiance present, inquiring about R shoe modifications as pt unable to manage heel of R shoe which later cases pain. Pt able to don R shoe & AFO with extra time and cuing to manage R heel; pt reports his hands & foot are cooperating today & reports attempting to problem solve issue for an hour yesterday with OT. Encouraged pt to continue attempting to don shoe and then receive assistance from other person. Pt reports his fiance may make alterations to his current AFO; educated pt on importance of not modifying this brace as this is used to specifically meet his gait needs & pt voiced understanding. Pt ambulated room>gym with RW & supervision. In parallel bars pt completed hip abduction monster walks and hip/knee flexion exercises with green theraband for resistance with activity focusing on hip strengthening. Pt utilized nu-step up to level 5 x 10 minutes with BLE only for strengthening purposes. At end of session pt left in handoff to OT.   Therapy Documentation Precautions:  Precautions Precautions: Fall, Back Precaution Comments: able to report 3/3 precautions Required Braces or Orthoses: Spinal Brace Spinal Brace: Lumbar corset, Applied in sitting position Restrictions Weight Bearing Restrictions: No  Pain: c/o "catching in back" but declines  requesting pain meds from RN.  See Function Navigator for Current Functional Status.   Therapy/Group: Individual Therapy  Sandi MariscalVictoria M Daiki Dicostanzo 07/14/2016, 10:28 AM

## 2016-07-14 NOTE — Progress Notes (Signed)
Darrell Watson is a 56 y.o. male 10/10/1960 [id number]  Subjective: No new complaints. No new problems. Slept well. Feeling OK.  Objective: Vital signs in last 24 hours: Temp:  [97.7 F (36.5 C)-98 F (36.7 C)] 98 F (36.7 C) (03/18 0458) Pulse Rate:  [97-100] 100 (03/18 0458) Resp:  [17-18] 17 (03/18 0458) BP: (122-142)/(68-79) 122/68 (03/18 0458) SpO2:  [100 %] 100 % (03/18 0458) Weight change:  Last BM Date: 07/13/16 (per pt. report)  Intake/Output from previous day: 03/17 0701 - 03/18 0700 In: 480 [P.O.:480] Out: -  Last cbgs: CBG (last 3)  No results for input(s): GLUCAP in the last 72 hours.   Physical Exam General: No apparent distress   HEENT: not dry Lungs: Normal effort. Lungs clear to auscultation, no crackles or wheezes. Cardiovascular: Regular rate and rhythm, no edema Abdomen: S/NT/ND; BS(+) Musculoskeletal:  unchanged Neurological: No new neurological deficits Wounds: N/A    Skin: clear   Mental state: Alert, oriented, cooperative In a w/c    Lab Results: BMET    Component Value Date/Time   NA 137 07/11/2016 0436   NA 137 03/05/2012 1351   K 4.1 07/11/2016 0436   K 4.0 03/05/2012 1351   CL 103 07/11/2016 0436   CL 104 03/05/2012 1351   CO2 28 07/11/2016 0436   CO2 28 03/05/2012 1351   GLUCOSE 89 07/11/2016 0436   GLUCOSE 79 03/05/2012 1351   BUN 10 07/11/2016 0436   BUN 13.0 03/05/2012 1351   CREATININE 1.16 07/12/2016 0620   CREATININE 1.4 (H) 03/05/2012 1351   CALCIUM 8.7 (L) 07/11/2016 0436   CALCIUM 9.6 03/05/2012 1351   GFRNONAA >60 07/12/2016 0620   GFRAA >60 07/12/2016 0620   CBC    Component Value Date/Time   WBC 6.6 07/11/2016 0436   RBC 4.08 (L) 07/11/2016 0436   HGB 11.2 (L) 07/11/2016 0436   HGB 13.3 07/28/2012 1411   HCT 35.5 (L) 07/11/2016 0436   HCT 41.6 07/28/2012 1411   PLT 406 (H) 07/11/2016 0436   PLT 265 07/28/2012 1411   MCV 87.0 07/11/2016 0436   MCV 83.9 07/28/2012 1411   MCH 27.5 07/11/2016 0436   MCHC  31.5 07/11/2016 0436   RDW 14.1 07/11/2016 0436   RDW 14.0 07/28/2012 1411   LYMPHSABS 1.2 07/08/2016 0412   LYMPHSABS 1.4 07/28/2012 1411   MONOABS 1.1 (H) 07/08/2016 0412   MONOABS 0.6 07/28/2012 1411   EOSABS 0.5 07/08/2016 0412   EOSABS 0.6 (H) 07/28/2012 1411   BASOSABS 0.0 07/08/2016 0412   BASOSABS 0.1 07/28/2012 1411    Studies/Results: No results found.  Medications: I have reviewed the patient's current medications.  Assessment/Plan:  1. L CVA, embolic - cont w/ASA 2. R hemiparesis ---- CIR 3. DVT proph -- Lovenox 4. Pain control - Oxycodone prn 5 Constipation - cont w/LOC  Length of stay, days: 9  Sonda PrimesAlex Irie Fiorello , MD 07/14/2016, 10:50 AM

## 2016-07-14 NOTE — Progress Notes (Signed)
Occupational Therapy Weekly Progress Note  Patient Details  Name: Darrell Watson MRN: [id number] Date of Birth: 1960/07/02  Beginning of progress report period: July 06, 2016 End of progress report period: July 13, 2016   Patient has met 4 of 4 short term goals.  Pt made excellent progress this week. Pt is currently able to perform all ADLs with MIN A to supervision assist. Pt continues to require cueing for safety awareness during functional mobility to improve R foot clearance and maintain upright posture. Pt has made progress in overall strength and endurance improving his ability to complete ADLs. Pt continues to be limited by LE weakness, decreased coordination, pain and back precautions. Pt plans to d/c home with intermittent supervision from fiance.   Patient continues to demonstrate the following deficits: muscle weakness and muscle joint tightness, decreased cardiorespiratoy endurance and ataxia, decreased coordination and decreased motor planning and therefore will continue to benefit from skilled OT intervention to enhance overall performance with BADL.  Patient progressing toward long term goals..  Continue plan of care.  OT Short Term Goals Week 2:  OT Short Term Goal 1 (Week 2): STG=LTG d/t ELOS    Therapy Documentation Precautions:  Precautions Precautions: Fall, Back Precaution Comments: able to report 3/3 precautions Required Braces or Orthoses: Spinal Brace Spinal Brace: Lumbar corset, Applied in sitting position Restrictions Weight Bearing Restrictions: No  See Function Navigator for Current Functional Status.   Therapy/Group: Individual Therapy  Tonny Branch 07/14/2016, 10:32 AM

## 2016-07-14 NOTE — Progress Notes (Signed)
Occupational Therapy Session Note  Patient Details  Name: Darrell Watson MRN: [id number] Date of Birth: [birth year]  Today's Date: 07/14/2016 OT Individual Time: [id number] OT Individual Time Calculation (min): 55 min    Short Term Goals: Week 1:  OT Short Term Goal 1 (Week 1): Pt will complete sit<stand during LB dressing with Min A OT Short Term Goal 2 (Week 1): Pt will complete toilet transfer with Min A and LRAD OT Short Term Goal 3 (Week 1): Pt will complete 1 grooming task in standing with Min A for balance  OT Short Term Goal 4 (Week 1): Pt will complete tub bench transfer with Min A and LRAD  Skilled Therapeutic Interventions/Progress Updates:    Pt with direct handoff from PT in hallway walking to room. Pt reporting 2/10 pain, but declines alerting RN because he does not want pain medication. Pt ambulates to from all therapeutic destinations with supervision-CGA with fatigue with VC for posture and to pick up RUE when walking. Pt stands at sink with supervision to complete oral care and shave in order to work on unsupported standing endurance and tolerance. Pt RLE spasms and pt able to stabilize himself on RW. Pt and fiance request to work on independence of donning AFO and R shoe. OT provides shoe horn and cues pt to assume figure 4. With AFO in shoe pt puts toes into shoes and uses small shoe horn to wedge between back of shoe and AFO to keep back of shoe from sliding down. Pt prefer this method and completes 3 rounds of blocked practice until he does not require cueing to don shoe/AFO. Ot recommended pt to sit and stretch BLE in seated figure 4 in between therapies in order to improve B hip flexibility. Pt completes obstacle course in therapy gym weaving around cones, side stepping up and over blocks to simulate walk in shower, and shooting basketball. Pt able to complete 2 rounds without rest break. Pt required VC for RW management and picking up of right foot. Pt returned to recliner in  room with call light in reach and fiance present.   Therapy Documentation Precautions:  Precautions Precautions: Fall, Back Precaution Comments: able to report 3/3 precautions Required Braces or Orthoses: Spinal Brace Spinal Brace: Lumbar corset, Applied in sitting position Restrictions Weight Bearing Restrictions: No General:   Vital Signs:  ADL: ADL ADL Comments: Please see functional navigator for ADL status  See Function Navigator for Current Functional Status.   Therapy/Group: Individual Therapy  [name] 07/14/2016, 10:22 AM

## 2016-07-15 ENCOUNTER — Inpatient Hospital Stay (HOSPITAL_COMMUNITY): Payer: 59 | Admitting: Occupational Therapy

## 2016-07-15 ENCOUNTER — Inpatient Hospital Stay (HOSPITAL_COMMUNITY): Payer: Self-pay | Admitting: Physical Therapy

## 2016-07-15 NOTE — Progress Notes (Signed)
Subjective/Complaints:    ROS- , no abd , no N/V , no CP or SOB  Objective: Vital Signs: Blood pressure 140/71, pulse 98, temperature 98.7 F (37.1 C), temperature source Oral, resp. rate 18, height 5\' 10"  (1.778 m), weight 89.1 kg (196 lb 8.5 oz), SpO2 100 %. No results found. No results found for this or any previous visit (from the past 72 hour(s)).   HEENT: normal Cardio: RRR and no murmur Resp: CTA B/L and unlabored GI: BS positive and NT, ND Extremity:  Pulses positive and No Edema Skin:   Intact Neuro: Alert/Oriented, Flat, Normal Sensory, Abnormal Motor 4/5 in Right delt bi, tri, grip HF, KE , ADF, 4/5 L HF, grip, 5/5 in remaining muscle groups on Left side and Abnormal FMC Ataxic/ dec FMC Musc/Skel:  Other PIP amp digits 3,4,5 Gen NAD   Assessment/Plan: 1. Functional deficits secondary to embolic L PLIC/temperoal/opercular infarct, lumbar fusion 06/28/16 complicated by L Psoas hematoma which require 3+ hours per day of interdisciplinary therapy in a comprehensive inpatient rehab setting. Physiatrist is providing close team supervision and 24 hour management of active medical problems listed below. Physiatrist and rehab team continue to assess barriers to discharge/monitor patient progress toward functional and medical goals. FIM: Function - Bathing Position: Shower Body parts bathed by patient: Right arm, Left arm, Chest, Abdomen, Front perineal area, Buttocks, Right upper leg, Left upper leg, Right lower leg, Left lower leg Body parts bathed by helper: Back Assist Level: Set up Set up : To obtain items  Function- Upper Body Dressing/Undressing What is the patient wearing?: Pull over shirt/dress, Orthosis Pull over shirt/dress - Perfomed by patient: Thread/unthread right sleeve, Thread/unthread left sleeve, Put head through opening, Pull shirt over trunk Orthosis activity level: Performed by patient Assist Level: More than reasonable time (pt had clothing set up  already) Function - Lower Body Dressing/Undressing What is the patient wearing?: Shoes, AFO Position: Wheelchair/chair at sink Underwear - Performed by patient: Thread/unthread right underwear leg, Thread/unthread left underwear leg, Pull underwear up/down Pants- Performed by patient: Thread/unthread right pants leg, Thread/unthread left pants leg, Pull pants up/down Non-skid slipper socks- Performed by helper: Don/doff right sock, Don/doff left sock Shoes - Performed by patient: Don/doff right shoe, Don/doff left shoe Shoes - Performed by helper: Don/doff right shoe, Don/doff left shoe AFO - Performed by patient: Don/doff right AFO TED Hose - Performed by helper: Don/doff right TED hose, Don/doff left TED hose Assist for footwear: Partial/moderate assist Assist for lower body dressing: Supervision or verbal cues  Function - Toileting Toileting steps completed by patient: Adjust clothing prior to toileting, Performs perineal hygiene Toileting steps completed by helper: Adjust clothing prior to toileting, Performs perineal hygiene, Adjust clothing after toileting Toileting Assistive Devices: Grab bar or rail Assist level: Touching or steadying assistance (Pt.75%)  Function - Archivist transfer assistive device: Elevated toilet seat/BSC over toilet Assist level to toilet: Supervision or verbal cues Assist level from toilet: Supervision or verbal cues  Function - Chair/bed transfer Chair/bed transfer method: Stand pivot, Ambulatory Chair/bed transfer assist level: Touching or steadying assistance (Pt > 75%) Chair/bed transfer assistive device: Armrests Chair/bed transfer details: Manual facilitation for weight shifting, Verbal cues for precautions/safety, Verbal cues for technique, Manual facilitation for placement  Function - Locomotion: Wheelchair Will patient use wheelchair at discharge?: No Type: Manual Max wheelchair distance: 150 Assist Level: Touching or steadying  assistance (Pt > 75%) Assist Level: Touching or steadying assistance (Pt > 75%) Assist Level: Touching or steadying assistance (  Pt > 75%) Turns around,maneuvers to table,bed, and toilet,negotiates 3% grade,maneuvers on rugs and over doorsills: No Function - Locomotion: Ambulation Assistive device: Walker-rolling, Orthosis Max distance: 150 ft Assist level: Supervision or verbal cues Assist level: Supervision or verbal cues Assist level: Supervision or verbal cues Assist level: Supervision or verbal cues Assist level: Moderate assist (Pt 50 - 74%)  Function - Comprehension Comprehension: Auditory Comprehension assist level: Understands complex 90% of the time/cues 10% of the time  Function - Expression Expression: Verbal Expression assist level: Expresses complex 90% of the time/cues < 10% of the time  Function - Social Interaction Social Interaction assist level: Interacts appropriately 90% of the time - Needs monitoring or encouragement for participation or interaction.  Function - Problem Solving Problem solving assist level: Solves complex 90% of the time/cues < 10% of the time  Function - Memory Memory assist level: Recognizes or recalls 90% of the time/requires cueing < 10% of the time Patient normally able to recall (first 3 days only): Current season, Location of own room, Staff names and faces, That he or she is in a hospital  1. 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  CIR PT, OT, SLP plan D/C in am  - Back brace when out of bed - mild L hip flexor weakness from psoas hematoma recheck cbc Hgb improving to 11.2 2. DVT Prophylaxis/Anticoagulation:  Monitor platelet counts (362K on 3/9)  of any signs of bleeding. Venous Doppler study negative  ASA 81mg  started for CVA prophyllaxis, SCD for DVT  prevention 3. Pain Management: Oxycodone and Robaxin as needed  -consider pre-treating prior to therapies  4. Mood: Provide emotional support  5. Neuropsych: This patient is capable of making decisions on his own behalf.  6. Skin/Wound Care: Routine skin checks  7. Fluids/Electrolytes/Nutrition: Routine I&O with follow-up chemistries , HypoK- , KCL supplement recheck BMET normal 8. Acute blood loss anemia due to hematoma/post-op. Follow serial Hgb--9.9 > 10.5> 11.2  on 3/12 .  Improving no further w/u at this time 9. Constipation. Laxative assistance   LOS (Days) 10 A FACE TO FACE EVALUATION WAS PERFORMED  Darrell Watson E 07/15/2016, 7:21 AM

## 2016-07-15 NOTE — Discharge Instructions (Signed)
Inpatient Rehab Discharge Instructions  Darrell Watson Discharge date and time: No discharge date for patient encounter.   Activities/Precautions/ Functional Status: Activity: activity as tolerated Diet: regular diet Wound Care: none needed Functional status:  ___ No restrictions     ___ Walk up steps independently ___ 24/7 supervision/assistance   ___ Walk up steps with assistance ___ Intermittent supervision/assistance  ___ Bathe/dress independently ___ Walk with walker     _x STROKE/TIA DISCHARGE INSTRUCTIONS SMOKING Cigarette smoking nearly doubles your risk of having a stroke & is the single most alterable risk factor  If you smoke or have smoked in the last 12 months, you are advised to quit smoking for your health.  Most of the excess cardiovascular risk related to smoking disappears within a year of stopping.  Ask you doctor about anti-smoking medications  Darrell Watson Quit Line: 1-800-QUIT NOW  Free Smoking Cessation Classes [phone]  CHOLESTEROL Know your levels; limit fat & cholesterol in your diet  Lipid Panel     Component Value Date/Time   CHOL 106 07/03/2016 1224   TRIG 111 07/03/2016 1224   HDL 45 07/03/2016 1224   CHOLHDL 2.4 07/03/2016 1224   VLDL 22 07/03/2016 1224   LDLCALC 39 07/03/2016 1224      Many patients benefit from treatment even if their cholesterol is at goal.  Goal: Total Cholesterol (CHOL) less than 160  Goal:  Triglycerides (TRIG) less than 150  Goal:  HDL greater than 40  Goal:  LDL (LDLCALC) less than 100   BLOOD PRESSURE American Stroke Association blood pressure target is less that 120/80 mm/Hg  Your discharge blood pressure is:  BP: 104/67  Monitor your blood pressure  Limit your salt and alcohol intake  Many individuals will require more than one medication for high blood pressure  DIABETES (A1c is a blood sugar average for last 3 months) Goal HGBA1c is under 7% (HBGA1c is blood sugar average for last 3 months)  Diabetes: No  known diagnosis of diabetes    Lab Results  Component Value Date   HGBA1C 6.5 (H) 07/03/2016     Your HGBA1c can be lowered with medications, healthy diet, and exercise.  Check your blood sugar as directed by your physician  Call your physician if you experience unexplained or low blood sugars.  PHYSICAL ACTIVITY/REHABILITATION Goal is 30 minutes at least 4 days per week  Activity: Increase activity slowly, Therapies: Physical Therapy: Home Health Return to work:   Activity decreases your risk of heart attack and stroke and makes your heart stronger.  It helps control your weight and blood pressure; helps you relax and can improve your mood.  Participate in a regular exercise program.  Talk with your doctor about the best form of exercise for you (dancing, walking, swimming, cycling).  DIET/WEIGHT Goal is to maintain a healthy weight  Your discharge diet is: Diet regular Room service appropriate? Yes; Fluid consistency: Thin  liquids Your height is:  Height: 5\' 10"  (177.8 cm) Your current weight is: Weight: 89.1 kg (196 lb 8.5 oz) Your Body Mass Index (BMI) is:  BMI (Calculated): 28  Following the type of diet specifically designed for you will help prevent another stroke.  Your goal weight range is:    Your goal Body Mass Index (BMI) is 19-24.  Healthy food habits can help reduce 3 risk factors for stroke:  High cholesterol, hypertension, and excess weight.  RESOURCES Stroke/Support Group:  Call [phone]   STROKE EDUCATION PROVIDED/REVIEWED AND GIVEN TO  PATIENT Stroke warning signs and symptoms How to activate emergency medical system (call 911). Medications prescribed at discharge. Need for follow-up after discharge. Personal risk factors for stroke. Pneumonia vaccine given:  Flu vaccine given:  My questions have been answered, the writing is legible, and I understand these instructions.  I will adhere to these goals & educational materials that have been provided to  me after my discharge from the hospital.   __ Bathe/dress with assistance ___ Walk Independently    ___ Shower independently ___ Walk with assistance    ___ Shower with assistance ___ No alcohol     ___ Return to work/school ________  Special Instructions:    COMMUNITY REFERRALS UPON DISCHARGE:    Outpatient: PT &OT  Agency:[facility] Phone:[phone]   Date of Last Service:07/16/2016  Appointment Date/Time:MARCH 22 THURSDAY 1:30-3:30 PM BOTH PT & OT  Medical Equipment/Items Ordered:ROLLING WALKER & TUB BENCH  Agency/Supplier:[facility]  [phone]   GENERAL COMMUNITY RESOURCES FOR PATIENT/FAMILY: Support Groups:[facility] SUPPORT GROUP EVERY SECOND THURSADAY @ 3:00-4:00 PM ON THE REHAB UNIT QUESTIONS CONTACT Darrell Watson [phone]  My questions have been answered and I understand these instructions. I will adhere to these goals and the provided educational materials after my discharge from the hospital.  Patient/Caregiver Signature _______________________________ Date __________  Clinician Signature _______________________________________ Date __________  Please bring this form and your medication list with you to all your follow-up doctor's appointments.

## 2016-07-15 NOTE — Progress Notes (Signed)
Occupational Therapy Session Note  Patient Details  Name: Darrell Watson MRN: [id number] Date of Birth: 1960-08-18  Today's Date: 07/15/2016 OT Individual Time: 1345-1428 OT Individual Time Calculation (min): 43 min    Short Term Goals: Week 2:  OT Short Term Goal 1 (Week 2): STG=LTG d/t ELOS  Skilled Therapeutic Interventions/Progress Updates:  Upon entering the room, pt seated in wheelchair with son present in room. Pt and caregiver having questions regarding upcoming discharge and home health recommendation. OT answered questions until pt with no further questions. Pt ambulates to bathroom with RW and stands to void while able to management clothing at mod I level. Pt ambulating to gym and engaged in horse shoe tasks by standing and tossing while maintaining precautions. Pt then ambulating with RW and squatting with use of reacher to obtain items from floor. Pt maintaining precautions throughout tasks. Pt returned to room and seated in wheelchair with call bell and all needed items within room.   Therapy Documentation Precautions:  Precautions Precautions: Fall, Back Precaution Comments: able to report 3/3 precautions Required Braces or Orthoses: Spinal Brace Spinal Brace: Lumbar corset, Applied in sitting position Restrictions Weight Bearing Restrictions: No General:   Vital Signs: Therapy Vitals Temp: 98.8 F (37.1 C) Temp Source: Oral Pulse Rate: 98 Resp: 18 BP: 130/83 Patient Position (if appropriate): Sitting Oxygen Therapy SpO2: 100 % O2 Device: Not Delivered Pain:   ADL: ADL ADL Comments: Please see functional navigator for ADL status Exercises:   Other Treatments:    See Function Navigator for Current Functional Status.   Therapy/Group: Individual Therapy  Alen BleacherBradsher, Hamed Debella P 07/15/2016, 4:48 PM

## 2016-07-15 NOTE — Progress Notes (Signed)
Occupational Therapy Note  Patient Details  Name: Darrell Watson MRN: [id number] Date of Birth: 1960-05-20  Today's Date: 07/15/2016 OT Individual Time: 1130-1200 OT Individual Time Calculation (min): 30 min   No c/o pain  Pt seen this session to practice and review tub bench transfers and methods to ensure back precautions and use of back brace.  Pt received in recliner and stated that he believes that his aunt has a tub bench. He will try to find out for sure by this afternoon.  Pt stood independently to RW and ambulated from room to tub room with close S.  In bathroom, reviewed correct set up of bench and transfers. Pt return demonstrated transfer well with S.  Discussed use of brace. Pt does say it is frustrating to go through all the steps of on/off with the brace for the shower and wants to walk in to bathroom without brace.  Explained that he needs to follow the precautions for his healing, unless his surgeon gives him clearance.  Reviewed back precautions with pt and he states he understands and will follow them as carefully as possible. Pt ambulated back to room and returned to recliner. Pt with all needs met and son in room with pt.  [place] 07/15/2016, 8:52 AM

## 2016-07-15 NOTE — Progress Notes (Signed)
Physical Therapy Discharge Summary  Patient Details  Name: Darrell Watson MRN: [id number] Date of Birth: Jan 18, 1961  Today's Date: 07/15/2016 PT Individual Time: 0905-1000 PT Individual Time Calculation (min): 55 min    Patient has met 12 of 12 long term goals due to improved activity tolerance, improved balance, improved postural control, increased strength, ability to compensate for deficits, functional use of  right upper extremity and right lower extremity, improved attention and improved coordination.  Patient to discharge at an ambulatory level supervision>mod I.   Patient's care partner is independent to provide the necessary supervision assistance at discharge.  Recommendation:  Patient will benefit from ongoing skilled PT services in outpatient setting to continue to advance safe functional mobility, address ongoing impairments in coordination, muscle activation, balance, and strength, and minimize fall risk.  Equipment: RW  Reasons for discharge: treatment goals met  Patient/family agrees with progress made and goals achieved: Yes   Skilled Therapeutic Intervention:  No c/o pain.  Session focus on d/c assessment, strengthening, balance, and activity tolerance.  Pt ambulates throughout unit with supervision and RW. Occasional spasticity in LLE during swing phase, improves with increasing gait distance.  Transfers throughout session mod I.  Pt completes curb step with RW and supervision to simulate home entry.  PT instructs pt in TUG (results below) and interprets outcomes for pt.  LE strengthening with 2x15 hip flexion and LAQ in sitting with back support.  Nustep x10 min with 4 extremities at level 4 for strengthening, activity tolerance, and reciprocal stepping pattern retraining.  Pt returned to room at end of session and positioned in w/c with call bell in reach and needs met.   PT Discharge Precautions/Restrictions Precautions Precautions: Fall;Back Required Braces or  Orthoses: Spinal Brace Spinal Brace: Lumbar corset;Applied in sitting position Restrictions Weight Bearing Restrictions: No Pain Pain Assessment Pain Assessment: No/denies pain Vision/Perception  Perception Comments: WFL  Cognition Overall Cognitive Status: Within Functional Limits for tasks assessed Arousal/Alertness: Awake/alert Orientation Level: Oriented X4 Sustained Attention: Appears intact Selective Attention: Appears intact Memory: Appears intact Awareness: Appears intact Problem Solving: Appears intact Sensation Sensation Light Touch: Appears Intact Coordination Gross Motor Movements are Fluid and Coordinated: No Fine Motor Movements are Fluid and Coordinated: No Coordination and Movement Description: ongong spasticity in LLE with ambulation especially when fatigued Heel Shin Test: impaired RLE, WFL LLE Motor  Motor Motor: Ataxia;Hemiplegia Motor - Discharge Observations: LLE spasticity  Mobility Transfers Transfers: Yes Sit to Stand: 6: Modified independent (Device/Increase time) Stand to Sit: 6: Modified independent (Device/Increase time) Stand Pivot Transfers: 6: Modified independent (Device/Increase time) Locomotion  Ambulation Ambulation: Yes Ambulation/Gait Assistance: 5: Supervision Ambulation Distance (Feet): 150 Feet Assistive device: Rolling walker Gait Gait: Yes Gait Pattern: Impaired Gait Pattern: Lateral hip instability;Poor foot clearance - right;Right genu recurvatum;Step-through pattern Stairs / Additional Locomotion Stairs: Yes Stairs Assistance: 5: Supervision Stair Management Technique: Two rails Number of Stairs: 12 Ramp: 5: Supervision Curb: 5: Supervision Wheelchair Mobility Wheelchair Mobility: No  Trunk/Postural Assessment  Cervical Assessment Cervical Assessment: Within Functional Limits Thoracic Assessment Thoracic Assessment: Within Functional Limits Lumbar Assessment Lumbar Assessment: Within Functional  Limits Postural Control Postural Control: Within Functional Limits  Balance Standardized Balance Assessment Standardized Balance Assessment: Timed Up and Go Test Timed Up and Go Test TUG: Normal TUG Normal TUG (seconds): 22.73 Static Sitting Balance Static Sitting - Level of Assistance: 7: Independent Dynamic Sitting Balance Dynamic Sitting - Level of Assistance: 6: Modified independent (Device/Increase time) Static Standing Balance Static Standing - Level of Assistance:  6: Modified independent (Device/Increase time) Dynamic Standing Balance Dynamic Standing - Level of Assistance: 5: Stand by assistance Extremity Assessment      RLE Assessment RLE Assessment: Exceptions to [facility] RLE AROM (degrees) Overall AROM Right Lower Extremity: Within functional limits for tasks assessed RLE Strength RLE Overall Strength: Deficits Right Hip Flexion: 3/5 Right Knee Flexion: 4+/5 Right Knee Extension: 4+/5 LLE Assessment LLE Assessment: Within Functional Limits LLE Strength LLE Overall Strength: Within Functional Limits for tasks assessed   See Function Navigator for Current Functional Status.  Petar Mucci E Penven-Crew 07/15/2016, 10:00 AM

## 2016-07-15 NOTE — Progress Notes (Signed)
Occupational Therapy Discharge Summary  Patient Details  Name: Darrell Watson MRN: [id number] Date of Birth: April 10, 1961   Patient has met 63 of 12 long term goals due to improved activity tolerance, improved balance, ability to compensate for deficits, functional use of  RIGHT upper and RIGHT lower extremity and improved awareness.  Patient to discharge at overall Modified Independent level, supervision for tub/shower transfers and bathing.  Patient's care partner is independent to provide the necessary physical and cognitive assistance at discharge.  Pt's fiance present during treatment sessions over the weekend to learn his care.  Reasons goals not met: Pt continues to require supervision/cues to adhere to back precautions during functional task of LB dressing.    Recommendation:  Patient will benefit from ongoing skilled OT services in outpatient setting to continue to advance functional skills in the area of BADL and Reduce care partner burden.  Equipment: No equipment provided Pt reports aunt has all bathroom equipment that he can have  Reasons for discharge: treatment goals met and discharge from hospital  Patient/family agrees with progress made and goals achieved: Yes  OT Discharge Precautions/Restrictions  Precautions Precautions: Fall;Back Required Braces or Orthoses: Spinal Brace Spinal Brace: Lumbar corset;Applied in sitting position Restrictions Weight Bearing Restrictions: No General   Vital Signs Therapy Vitals Temp: 98.8 F (37.1 C) Temp Source: Oral Pulse Rate: 98 Resp: 18 BP: 130/83 Patient Position (if appropriate): Sitting Oxygen Therapy SpO2: 100 % O2 Device: Not Delivered Pain Pain Assessment Pain Assessment: 0-10 Pain Score: 3  Pain Type: Acute pain Pain Location: Abdomen Pain Orientation: Mid Pain Frequency: Occasional Pain Intervention(s): Medication (See eMAR) ADL ADL ADL Comments: Please see functional navigator for ADL  status Vision/Perception  Vision- History Baseline Vision/History: Wears glasses Wears Glasses: Reading only Patient Visual Report: No change from baseline Vision- Assessment Vision Assessment?: No apparent visual deficits Perception Comments: WFL  Cognition Overall Cognitive Status: Within Functional Limits for tasks assessed Arousal/Alertness: Awake/alert Orientation Level: Oriented X4 Sustained Attention: Appears intact Selective Attention: Appears intact Memory: Appears intact Awareness: Appears intact Problem Solving: Appears intact Sensation Sensation Light Touch: Appears Intact Coordination Gross Motor Movements are Fluid and Coordinated: No Fine Motor Movements are Fluid and Coordinated: No Coordination and Movement Description: ongong spasticity in LLE with ambulation especially when fatigued Heel Shin Test: impaired RLE, WFL LLE Motor  Motor Motor: Ataxia;Hemiplegia Motor - Discharge Observations: LLE spasticity Mobility  Transfers Sit to Stand: 6: Modified independent (Device/Increase time) Stand to Sit: 6: Modified independent (Device/Increase time)  Trunk/Postural Assessment  Cervical Assessment Cervical Assessment: Within Functional Limits Thoracic Assessment Thoracic Assessment: Within Functional Limits Lumbar Assessment Lumbar Assessment: Within Functional Limits Postural Control Postural Control: Within Functional Limits  Balance Standardized Balance Assessment Standardized Balance Assessment: Timed Up and Go Test Timed Up and Go Test TUG: Normal TUG Normal TUG (seconds): 22.73 Static Sitting Balance Static Sitting - Level of Assistance: 7: Independent Dynamic Sitting Balance Dynamic Sitting - Level of Assistance: 6: Modified independent (Device/Increase time) Static Standing Balance Static Standing - Level of Assistance: 6: Modified independent (Device/Increase time) Dynamic Standing Balance Dynamic Standing - Level of Assistance: 5: Stand by  assistance Extremity/Trunk Assessment RUE Assessment RUE Assessment: Exceptions to [facility] (grossly 4/5 overall) LUE Assessment LUE Assessment: Within Functional Limits (limited shoulder flexion due to past shoulder surgery)   See Function Navigator for Current Functional Status.  Simonne Come 07/15/2016, 3:55 PM

## 2016-07-15 NOTE — Discharge Summary (Signed)
[name]:  Watson, Darrell Watson                  ACCOUNT NO.:  [id number]  MEDICAL RECORD NO.:  [id number]  LOCATION:  [id number]                        FACILITY:  [facility]  PHYSICIAN:  Darrell ColaceAndrew E. Watson, M.D.DATE OF BIRTH:  [birth year]  DATE OF ADMISSION:  07/05/2016 DATE OF DISCHARGE:  07/16/2016                              DISCHARGE SUMMARY   DISCHARGE DIAGNOSES: 1. 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 2. SCDs for DVT prophylaxis.  Venous Doppler studies negative. 3. Pain management. 4. Acute blood loss anemia. 5. Constipation.  HISTORY OF PRESENT ILLNESS:  This is a 56 year old right-handed male with history of recent L5-S1 fusion, anterior approach, June 28, 2016. He lives with spouse, worked in maintenance prior to admission. Presented on July 03, 2016, with right-sided weakness and facial droop with slurred speech.  Cranial CT scan negative.  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  MRA negative.  The patient did not receive tPA.  Echocardiogram with ejection fraction of 50%, no wall motion abnormalities, no emboli identified.  Carotid Dopplers, no ICA stenosis.  Findings of significant decrease in hemoglobin 15.2-8.3. Recent back surgery.  A CT of abdomen and pelvis showed intramuscular hematoma of the left rectus muscle.  Vascular Surgery consulted in regard to hematoma.  Advised conservative care.  Neurology continued to follow.  Await plan for anticoagulation.  He had been placed on subcutaneous Lovenox for DVT prophylaxis on July 03, 2016.  Venous Doppler studies negative.  He continued with back brace when out of bed. Physical and occupational therapy ongoing.  The patient was admitted for a comprehensive rehab program.  PAST MEDICAL  HISTORY:  See discharge diagnoses.  SOCIAL HISTORY:  Lives with spouse, independent prior to back surgery.  FUNCTIONAL STATUS UPON ADMISSION TO REHAB SERVICES:  Moderate assist 80 feet rolling walker, moderate assist sit to stand, min to mod assist activities of daily living.  PHYSICAL EXAMINATION:  VITAL SIGNS:  Blood pressure 130/70, pulse 108, temperature 99, and respirations 18. GENERAL:  This was an alert male, in no acute distress. HEENT:  EOMs intact. NECK:  Supple.  Nontender.  No JVD. CARDIAC:  Regular rate and rhythm.  No murmur. ABDOMEN:  Soft and nontender.  Good bowel sounds. LUNGS:  Clear to auscultation without wheeze. NEUROLOGIC:  Speech was mildly dysarthric.  Fully intelligible.  Fair insight to awareness of deficits.  REHABILITATION HOSPITAL COURSE:  The patient was admitted to Inpatient Rehab Services with therapies initiated on a 3-hour daily basis consisting of physical therapy, occupational therapy, and rehabilitation nursing.  The following issues were addressed during the patient's rehabilitation stay.  Pertaining to Darrell Watson's left posterior limb internal capsule infarct remained stable, low-dose aspirin had been initiated.  Venous Doppler studies of lower extremities negative.  He was followed closely after recent lumbar fusion on June 28, 2016, by Darrell Watson complicated by intramuscular hematoma of the left rectus muscle. Hemoglobin and hematocrit remained stable at 11.2.  Blood pressure is well controlled, on no current antihypertensive medication.  Bouts of constipation resolved with laxative assistance.  The patient received weekly collaborative interdisciplinary team conferences to discuss estimated length of stay, family teaching, any barriers to discharge. He had been fitted with a right AFO brace.  Ambulates to his room and gymnasium with rolling walker and supervision.  Stair negotiation using alternating pattern for strengthening, minimal assist  for safety.  He could gather belongings for activities of daily living and homemaking, hygiene, bathing, grooming.  Full family teaching was completed and plan discharge to home.  DISCHARGE MEDICATIONS:  Included aspirin 81 mg p.o. daily, Ativan 1 mg p.o. at bedtime, Robaxin 500 mg p.o. t.i.d. as needed, Ultram 50 mg p.o. every 6 hours as needed, Desyrel 50 mg daily at bedtime as needed sleep.  DISCHARGE INSTRUCTIONS:  His diet was regular.  The patient would follow up with Darrell [facility] as directed; Darrell Watson, Orthopedic Service, call for appointment; Darrell Watson, Neurology Services, call for appointment in 1 month; Darrell Watson, medical management.     Darrell Watson, P.A.   ______________________________ Darrell Watson, M.D.    DA/MEDQ  D:  07/15/2016  T:  07/15/2016  Job:  [id number]  cc:   Darrell Watson, M.D. Darrell P. Pearlean Brownie, MD Darrell Watson, M.D. Darrell Watson, M.D.

## 2016-07-15 NOTE — Discharge Summary (Signed)
Discharge summary job # [id number]

## 2016-07-15 NOTE — Progress Notes (Signed)
Occupational Therapy Session Note  Patient Details  Name: Darrell Watson MRN: [id number] Date of Birth: 1960/05/30  Today's Date: 07/15/2016 OT Individual Time: [id number] OT Individual Time Calculation (min): 60 min    Short Term Goals: Week 2:  OT Short Term Goal 1 (Week 2): STG=LTG d/t ELOS  Skilled Therapeutic Interventions/Progress Updates:    Completed ADL retraining at overall Mod I - supervision level.  Pt received upright finishing breakfast upon arrival.  Engaged in discussion regarding recommendation for supervision with shower transfers and bathing as well as use of DME to increase safety with transfers and adherence to back precautions during bathing.  Pt reports he may have access to bathroom DME, plans to find out before next OT session.  Discussed recommendation for tub transfer bench for tub/shower.  Pt attempted to ambulate to room shower without back brace donned, therapist provided reminders for back precautions and MD order to don brace in sitting and to wear back brace with all standing and ambulation.  Bathing completed with setup assist.  LB dressing with min cues for sequencing to increase safety and independence.  Again reiterated MD order to don back brace in sitting.  Completed toilet transfers with pt able to complete sit <> stand from standard commode without use of grab bars with increased time.  Recommend BSC for increased safety and ease with sit > stand from toilet as pt will be alone intermittently during the day while fiance works.  Pt able to don shoes with AFO this session, demonstrating recall from previous session.    Therapy Documentation Precautions:  Precautions Precautions: Fall, Back Precaution Comments: able to report 3/3 precautions Required Braces or Orthoses: Spinal Brace Spinal Brace: Lumbar corset, Applied in sitting position Restrictions Weight Bearing Restrictions: No General:   Vital Signs: Therapy Vitals Temp: 98.7 F (37.1 C) Temp  Source: Oral Pulse Rate: 98 Resp: 18 BP: 140/71 Patient Position (if appropriate): Sitting Oxygen Therapy SpO2: 100 % O2 Device: Not Delivered Pain: Pain Assessment Pain Assessment: No/denies pain  See Function Navigator for Current Functional Status.   Therapy/Group: Individual Therapy  [name], [name] 07/15/2016, 9:23 AM

## 2016-07-16 MED ORDER — ASPIRIN 81 MG PO TBEC: 81.0000 mg | DELAYED_RELEASE_TABLET | Freq: Every day | ORAL | Status: AC

## 2016-07-16 MED ORDER — METHOCARBAMOL 500 MG PO TABS: 500.0000 mg | ORAL_TABLET | Freq: Three times a day (TID) | ORAL | 0 refills | Status: DC | PRN

## 2016-07-16 MED ORDER — TRAZODONE HCL 50 MG PO TABS: 50.0000 mg | ORAL_TABLET | Freq: Every evening | ORAL | 0 refills | Status: DC | PRN

## 2016-07-16 MED ORDER — TRAMADOL HCL 50 MG PO TABS: 50.0000 mg | ORAL_TABLET | Freq: Four times a day (QID) | ORAL | 0 refills | Status: DC | PRN

## 2016-07-16 NOTE — Progress Notes (Signed)
Patient discharged home.  Left floor via wheelchair, escorted by nursing staff and spouse.  Patient and spouse verbalized understanding of discharge instructions as given by Deatra Inaan Angiulli, PA.  All patient belongings sent with patient, including DME and prescriptions.  Appears to be in no immediate distress at this time.  Dani Gobbleeardon, Mozetta Murfin J, RN

## 2016-07-16 NOTE — Progress Notes (Signed)
Social Work  Discharge Note  The overall goal for the admission was met for:   Discharge location: Yes-HOME WITH GIRLFRIEND, BETWEEN SHE AND SON WILL HAVE 24 HR SUPERVISION  Length of Stay: Yes-11 DAYS  Discharge activity level: Yes-MOD/I-SUPERVISION LEVEL  Home/community participation: Yes  Services provided included: MD, RD, PT, OT, SLP, RN, CM, TR, Pharmacy, Neuropsych and SW  Financial Services: Private Insurance: [facility]  Follow-up services arranged: Outpatient: [facility] 3/22 1:30-3;30 PM, DME: [place] and Patient/Family has no preference for HH/DME agencies  Comments (or additional information):GIRLFRIEND & SON WERE HERE DAILY AND PARTICIPATED IN THERAPIES WITH PT. ALL COMFORTABLE WITH DC TODAY  Patient/Family verbalized understanding of follow-up arrangements: Yes  Individual responsible for coordination of the follow-up plan: SELF & MALVINA-GIRLFRIEND  Confirmed correct DME delivered: Elease Hashimoto 07/16/2016    Elease Hashimoto

## 2016-07-16 NOTE — Progress Notes (Signed)
Subjective/Complaints:  No issues overnite  ROS- , no abd , no N/V , no CP or SOB  Objective: Vital Signs: Blood pressure (!) 106/53, pulse 97, temperature 98.1 F (36.7 C), temperature source Oral, resp. rate 18, height 5\' 10"  (1.778 m), weight 89.1 kg (196 lb 8.5 oz), SpO2 99 %. No results found. No results found for this or any previous visit (from the past 72 hour(s)).   HEENT: normal Cardio: RRR and no murmur Resp: CTA B/L and unlabored GI: BS positive and NT, ND Extremity:  Pulses positive and No Edema Skin:   Intact Neuro: Alert/Oriented, Flat, Normal Sensory, Abnormal Motor 4/5 in Right delt bi, tri, grip HF, KE , ADF, 4/5 L HF, grip, 5/5 in remaining muscle groups on Left side and Abnormal FMC Ataxic/ dec FMC Musc/Skel:  Other PIP amp digits 3,4,5 Gen NAD   Assessment/Plan: 1. Functional deficits secondary to embolic L PLIC/temporal/opercular infarct, lumbar fusion 06/28/16 complicated by L Psoas hematoma  Stable for D/C today F/u PCP in 3-4 weeks F/u PM&R 2 weeks See D/C summary See D/C instructions FIM: Function - Bathing Position: Shower Body parts bathed by patient: Right arm, Left arm, Chest, Abdomen, Front perineal area, Buttocks, Right upper leg, Left upper leg, Right lower leg, Left lower leg Body parts bathed by helper: Back Bathing not applicable: Back Assist Level: Supervision or verbal cues, Set up Set up : To obtain items  Function- Upper Body Dressing/Undressing What is the patient wearing?: Pull over shirt/dress, Orthosis Pull over shirt/dress - Perfomed by patient: Thread/unthread right sleeve, Thread/unthread left sleeve, Put head through opening, Pull shirt over trunk Orthosis activity level: Performed by patient Assist Level: More than reasonable time Function - Lower Body Dressing/Undressing What is the patient wearing?: Underwear, Pants, Shoes, AFO, Ted Hose Position:  (recliner) Underwear - Performed by patient: Thread/unthread right  underwear leg, Thread/unthread left underwear leg, Pull underwear up/down Pants- Performed by patient: Thread/unthread right pants leg, Thread/unthread left pants leg, Pull pants up/down Non-skid slipper socks- Performed by helper: Don/doff right sock, Don/doff left sock Shoes - Performed by patient: Don/doff right shoe, Don/doff left shoe Shoes - Performed by helper: Don/doff right shoe, Don/doff left shoe AFO - Performed by patient: Don/doff right AFO TED Hose - Performed by helper: Don/doff right TED hose, Don/doff left TED hose Assist for footwear: Supervision/touching assist Assist for lower body dressing: Supervision or verbal cues  Function - Toileting Toileting steps completed by patient: Adjust clothing prior to toileting, Performs perineal hygiene Toileting steps completed by helper: Adjust clothing prior to toileting, Performs perineal hygiene, Adjust clothing after toileting Toileting Assistive Devices: Grab bar or rail Assist level: More than reasonable time  Function - ArchivistToilet Transfers Toilet transfer assistive device: Walker Assist level to toilet: No Help, no cues, assistive device, takes more than a reasonable amount of time Assist level from toilet: No Help, no cues, assistive device, takes more than a reasonable amount of time  Function - Chair/bed transfer Chair/bed transfer method: Stand pivot Chair/bed transfer assist level: No Help, no cues, assistive device, takes more than a reasonable amount of time Chair/bed transfer assistive device: Walker, Orthosis Chair/bed transfer details: Manual facilitation for weight shifting, Verbal cues for precautions/safety, Verbal cues for technique, Manual facilitation for placement  Function - Locomotion: Wheelchair Will patient use wheelchair at discharge?: No Type: Manual Wheelchair activity did not occur:  (pt ambulatory on unit) Max wheelchair distance: 150 Assist Level: Touching or steadying assistance (Pt > 75%) Assist  Level: Touching  or steadying assistance (Pt > 75%) Assist Level: Touching or steadying assistance (Pt > 75%) Turns around,maneuvers to table,bed, and toilet,negotiates 3% grade,maneuvers on rugs and over doorsills: No Function - Locomotion: Ambulation Assistive device: Walker-rolling Max distance: 100' Assist level: Supervision or verbal cues Assist level: Supervision or verbal cues Assist level: Supervision or verbal cues Assist level: Supervision or verbal cues Assist level: Supervision or verbal cues  Function - Comprehension Comprehension: Auditory Comprehension assist level: Understands basic 90% of the time/cues < 10% of the time  Function - Expression Expression: Verbal Expression assist level: Expresses basic needs/ideas: With no assist  Function - Social Interaction Social Interaction assist level: Interacts appropriately with others with medication or extra time (anti-anxiety, antidepressant).  Function - Problem Solving Problem solving assist level: Solves basic 90% of the time/requires cueing < 10% of the time  Function - Memory Memory assist level: Recognizes or recalls 90% of the time/requires cueing < 10% of the time Patient normally able to recall (first 3 days only): Current season, Location of own room, Staff names and faces, That he or she is in a hospital  1. 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  CIR PT, OT, SLP plan D/C today - Back brace when out of bed - mild L hip flexor weakness from psoas hematoma recheck cbc Hgb improving to 11.2 2. DVT Prophylaxis/Anticoagulation:  Monitor platelet counts (362K on 3/9)  of any signs of bleeding. Venous Doppler study negative  ASA 81mg  started for CVA prophyllaxis, SCD for DVT prevention 3. Pain Management: Oxycodone and Robaxin as  needed  -consider pre-treating prior to therapies  4. Mood: Provide emotional support  5. Neuropsych: This patient is capable of making decisions on his own behalf.  6. Skin/Wound Care: Routine skin checks  7. Fluids/Electrolytes/Nutrition: Routine I&O with follow-up chemistries , HypoK- , KCL supplement recheck BMET normal 8. Acute blood loss anemia due to hematoma/post-op. Follow serial Hgb--9.9 > 10.5> 11.2  on 3/12 .  Improving no further w/u at this time 9. Constipation. Laxative assistance   LOS (Days) 11 A FACE TO FACE EVALUATION WAS PERFORMED  Kaedynce Tapp E 07/16/2016, 7:16 AM

## 2016-07-18 ENCOUNTER — Ambulatory Visit: Payer: 59 | Admitting: Occupational Therapy

## 2016-07-18 ENCOUNTER — Ambulatory Visit: Payer: 59 | Attending: Physical Medicine & Rehabilitation | Admitting: Physical Therapy

## 2016-07-18 DIAGNOSIS — R278 Other lack of coordination: Secondary | ICD-10-CM | POA: Insufficient documentation

## 2016-07-18 DIAGNOSIS — R2689 Other abnormalities of gait and mobility: Secondary | ICD-10-CM

## 2016-07-18 DIAGNOSIS — R2681 Unsteadiness on feet: Secondary | ICD-10-CM | POA: Diagnosis present

## 2016-07-18 DIAGNOSIS — M6281 Muscle weakness (generalized): Secondary | ICD-10-CM | POA: Diagnosis not present

## 2016-07-18 DIAGNOSIS — M545 Low back pain, unspecified: Secondary | ICD-10-CM

## 2016-07-18 DIAGNOSIS — I69352 Hemiplegia and hemiparesis following cerebral infarction affecting left dominant side: Secondary | ICD-10-CM | POA: Diagnosis not present

## 2016-07-18 NOTE — Therapy (Addendum)
[facility] [address] [place], [place], [address] Phone: [phone]   Fax:  [phone]  Physical Therapy Evaluation  Patient Details  Name: KARTHIKEYA FUNKE MRN: [id number] Date of Birth: 1961-01-23 Referring Provider: Charlett Blake, MD  Encounter Date: 07/18/2016      PT End of Session - 07/18/16 1540    Visit Number 1   Number of Visits 16   Date for PT Re-Evaluation 09/12/16   Authorization Type UHC-60 visit limit combined   PT Start Time 2355   PT Stop Time 1523   PT Time Calculation (min) 38 min   Equipment Utilized During Treatment Gait belt;Back brace   Activity Tolerance Patient tolerated treatment well   Behavior During Therapy [facility] for tasks assessed/performed      Past Medical History:  Diagnosis Date  . Anemia   . Arthritis   . Tachycardia     Past Surgical History:  Procedure Laterality Date  . ABDOMINAL EXPOSURE N/A 06/27/2016   Procedure: ABDOMINAL EXPOSURE;  Surgeon: Angelia Mould, MD;  Location: [facility];  Service: Vascular;  Laterality: N/A;  . ANTERIOR LUMBAR FUSION N/A 06/27/2016   Procedure: ANTERIOR LUMBAR FUSION L5-S1;  Surgeon: Melina Schools, MD;  Location: [facility];  Service: Orthopedics;  Laterality: N/A;  Requests 3 hours  . FINGER AMPUTATION Left   . SHOULDER SURGERY      There were no vitals filed for this visit.       Subjective Assessment - 07/18/16 1447    Subjective Pt is a 56 y/o male who presents to OPPT s/p lumbar fusion on 06/27/16;  d/c'ed home for ~ 1 week then had 2 falls.  Pt's fiance came to check on pt and pt admitted to hospital 07/03/16 for L CVA resulting in R sided weakness.  Pt then had CIR and discharged 07/16/16.  Pt presents today with decreased balance, continued back pain, and decreased mobiltiy.   Pertinent History lumbar fusion 06/27/16, CVA   Limitations Sitting;Standing;Walking;House hold activities   How long can you walk comfortably? at least 10  min   Patient Stated Goals improve RLE strength   Currently in Pain? Yes   Pain Score 3    Pain Location Back   Pain Orientation Lower   Pain Descriptors / Indicators Sharp   Pain Type Acute pain   Pain Onset 1 to 4 weeks ago   Pain Frequency Occasional   Aggravating Factors  stand to sit   Pain Relieving Factors transitioning slowly            [facility] PT Assessment - 07/18/16 1453      Assessment   Medical Diagnosis CVA, lumbar fusion   Referring Provider Charlett Blake, MD   Onset Date/Surgical Date 07/03/16  fusion 06/27/16   Hand Dominance Left   Next MD Visit 08/02/16   Prior Therapy CIR     Precautions   Precautions Back;Other (comment)  no driving at this time   Precaution Comments BLT (No bending, lifting, twisting). No lifting > 5 lbs   Required Braces or Orthoses Spinal Brace  and Rt AFO   Spinal Brace Lumbar corset;Applied in sitting position  when OOB     Restrictions   Weight Bearing Restrictions No     Balance Screen   Has the patient fallen in the past 6 months Yes   How many times? 2-due to CVA   Has the patient had a decrease in activity level because of  a fear of falling?  No   Is the patient reluctant to leave their home because of a fear of falling?  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   Leisure lifting weights; gym 5-6 days/wk     Cognition   Overall Cognitive Status Within Functional Limits for tasks assessed     Observation/Other Assessments   Focus on Therapeutic Outcomes  (FOTO)  32 (68% limited; predicted 41% limited)     Sensation   Light Touch Appears Intact     Tone   Assessment Location Right Lower Extremity     Strength   Overall Strength Comments LLE 5/5   Right Hip Flexion 3+/5   Right Knee Flexion 4-/5   Right Knee Extension 4/5   Right Ankle Dorsiflexion 3/5     Ambulation/Gait   Ambulation/Gait Yes   Ambulation/Gait Assistance 5: Supervision   Ambulation Distance (Feet) 200 Feet   Assistive device Rolling walker   Gait Pattern Decreased stance time - right;Decreased step length - left;Decreased hip/knee flexion - right;Decreased dorsiflexion - right;Right genu recurvatum;Lateral hip instability;Poor foot clearance - right  R foot dragging   Ambulation Surface Level;Indoor   Gait velocity 1.84 ft/sec  17.85 sec with RW     Standardized Balance Assessment   Standardized Balance Assessment Berg Balance Test;Timed Up and Go Test     Berg Balance Test   Sit to Stand Able to stand without using hands and stabilize independently   Standing Unsupported Able to stand safely 2 minutes   Sitting with Back Unsupported but Feet Supported on Floor or Stool Able to sit safely and securely 2 minutes   Stand to Sit Controls descent by using hands   Transfers Able to transfer safely, minor use of hands   Standing Unsupported with Eyes Closed Able to stand 10 seconds safely   Standing Ubsupported with Feet Together Able to place feet together independently and stand for 1 minute with supervision   From Standing, Reach Forward with Outstretched Arm Can reach forward >12 cm safely (5")  maintaining back precautions   From Standing Position, Pick up Object from Floor Unable to try/needs assist to keep balance  due to back precautions   From Standing Position, Turn to Look Behind Over each Shoulder Needs assist to keep from losing balance and falling  due to back precautions   Turn 360 Degrees Needs close supervision or verbal cueing   Standing  Unsupported, Alternately Place Feet on Step/Stool Able to complete >2 steps/needs minimal assist   Standing Unsupported, One Foot in Front Able to plae foot ahead of the other independently and hold 30 seconds   Standing on One Leg Able to lift leg independently and hold equal to or more than 3 seconds  RLE: unable   Total Score 36     Timed Up and Go Test   Normal TUG (seconds) 21.59   TUG Comments with RW     RLE Tone  RLE Tone Moderate;Mild                           PT Education - 07/18/16 1539    Education provided Yes   Education Details clinical findings, POC, goals of care   Person(s) Educated Patient   Methods Explanation   Comprehension Verbalized understanding          PT Short Term Goals - 07/18/16 1549      PT SHORT TERM GOAL #1   Title verbalize understanding of CVA risk factors/warning signs  (08/15/16)   Time 4   Period Weeks   Status New     PT SHORT TERM GOAL #2   Title improve BERG balance score to >/= 41/56 for improved balance (08/15/16)   Time 4   Period Weeks   Status New     PT SHORT TERM GOAL #3   Title improve gait velocity to > 2.1 ft/sec for improved mobility (08/15/16)   Time 4   Period Weeks   Status New     PT SHORT TERM GOAL #4   Title amb > 250' with SPC with supervision for improved mobility and progressing towards greater independence (08/15/16)   Time 4   Period Weeks   Status New     PT SHORT TERM GOAL #5   Title improve timed up and go to < 17 sec with LRAD for improved mobility (08/15/16)   Time 4   Period Weeks   Status New           PT Long Term Goals - 07/18/16 1554      PT LONG TERM GOAL #1   Title independent with HEP (09/12/16)   Time 8   Period Weeks   Status New     PT LONG TERM GOAL #2   Title improve BERG balance score to >/= 47/56 for decreased fall risk (09/12/16)   Baseline 07/18/16: max score likely 48/56 due to back precautions unless cleared by MD   Time 8   Period Weeks    Status New     PT LONG TERM GOAL #3   Title improve gait velocity to >/= 2.62 ft/sec for improved community access (09/12/16)   Time 8   Period Weeks   Status New     PT LONG TERM GOAL #4   Title amb > 500' with SPC modified independent on various indoor/outdoor surfaces for improved mobility and community access (09/14/16)   Time 8   Period Weeks   Status New     PT LONG TERM GOAL #5   Title improve timed up and go to < 14 sec for decreased fall risk with LRAD (09/14/16)   Time 8   Period Weeks   Status New     Additional Long Term Goals   Additional Long Term Goals Yes     PT LONG TERM GOAL #6   Title report ability to perform transitional movements without pain for improved function (09/14/16)   Time 8   Period Weeks   Status New               Plan - 07/18/16 1540    Clinical Impression Statement Pt is a 56 y/o male who presents to OPPT for moderate complexity PT eval s/p lumbar fusion with subsequent CVA 1 week later.  Pt with residual R sided weakness, increased RLE tone, gait abnormalities, and decreased balance affecting safe functional mobility.  Will  benefit from PT to maximize function and address deficits listed.   Rehab Potential Good   PT Frequency 2x / week   PT Duration 8 weeks   PT Treatment/Interventions ADLs/Self Care Home Management;Cryotherapy;Electrical Stimulation;Moist Heat;Neuromuscular re-education;Balance training;Therapeutic exercise;Therapeutic activities;Functional mobility training;Patient/family education;Gait training;DME Instruction;Stair training;Orthotic Fit/Training;Taping   PT Next Visit Plan establish HEP for RLE strengthening, tall kneeling/half kneeling activities, gait with cane   Consulted and Agree with Plan of Care Patient      Patient will benefit from skilled therapeutic intervention in order to improve the following deficits and impairments:  Abnormal gait, Decreased balance, Decreased mobility, Decreased strength, Impaired  tone, Pain  Visit Diagnosis: Hemiplegia and hemiparesis following cerebral infarction affecting left dominant side (HCC) - Plan: PT plan of care cert/re-cert  Muscle weakness (generalized) - Plan: PT plan of care cert/re-cert  Other abnormalities of gait and mobility - Plan: PT plan of care cert/re-cert  Unsteadiness on feet - Plan: PT plan of care cert/re-cert  Acute midline low back pain without sciatica - Plan: PT plan of care cert/re-cert     Problem List Patient Active Problem List   Diagnosis Date Noted  . Small vessel disease, cerebrovascular 07/05/2016  . Right sided weakness 07/03/2016  . Facial droop 07/03/2016  . Stroke ([place]) 07/03/2016  . Anemia   . Tachycardia   . Back pain 06/27/2016      Laureen Abrahams, PT, DPT 07/18/16 4:00 PM    [facility] [address] [place], [place], [address] Phone: [phone]   Fax:  [phone]  Name: MERVILLE HIJAZI MRN: [id number] Date of Birth: 13-May-1960      PHYSICAL THERAPY DISCHARGE SUMMARY  Visits from Start of Care: 1  Current functional level related to goals / functional outcomes: See above   Remaining deficits: Unknown; pt did not return.  Per notes pt received HHPT then OPPT for lumbar surgery; and just recently returned to this clinic for new eval.   Education / Equipment: n/a  Plan: Patient agrees to discharge.  Patient goals were not met. Patient is being discharged due to not returning since the last visit.  ?????    Laureen Abrahams, PT, DPT 10/22/16 10:04 AM  [facility] [address] [place], [place] [address]  [phone] (office) [phone] (fax)

## 2016-07-18 NOTE — Therapy (Signed)
[facility] [address] [place], [place], [address] Phone: [phone]   Fax:  [phone]  Occupational Therapy Evaluation  Patient Details  Name: Darrell Watson MRN: [id number] Date of Birth: 1960/12/28 Referring Provider: Dr. Wynn Banker  Encounter Date: 07/18/2016      OT End of Session - 07/18/16 1503    Visit Number 1   Number of Visits 9   Date for OT Re-Evaluation 08/18/16   Authorization Type UHC - 60 VISIT LIMIT COMBINED   Authorization - Visit Number 1   Authorization - Number of Visits 30   OT Start Time 1400   OT Stop Time 1445   OT Time Calculation (min) 45 min   Activity Tolerance Patient tolerated treatment well      Past Medical History:  Diagnosis Date  . Anemia   . Arthritis   . Tachycardia     Past Surgical History:  Procedure Laterality Date  . ABDOMINAL EXPOSURE N/A 06/27/2016   Procedure: ABDOMINAL EXPOSURE;  Surgeon: Chuck Hint, MD;  Location: [facility] OR;  Service: Vascular;  Laterality: N/A;  . ANTERIOR LUMBAR FUSION N/A 06/27/2016   Procedure: ANTERIOR LUMBAR FUSION L5-S1;  Surgeon: Venita Lick, MD;  Location: [facility];  Service: Orthopedics;  Laterality: N/A;  Requests 3 hours  . FINGER AMPUTATION Left   . SHOULDER SURGERY      There were no vitals filed for this visit.      Subjective Assessment - 07/18/16 1407    Pertinent History 06/27/16 lumbar fusion L5-S1, CVA 07/03/16   Limitations back precautions - no bending, lifting, twisting, brace on when OOB   Currently in Pain? Yes   Pain Score 3    Pain Location Back   Pain Orientation Lower   Pain Descriptors / Indicators Sharp   Pain Type Acute pain   Pain Onset 1 to 4 weeks ago   Pain Frequency Occasional   Aggravating Factors  From standing to sitting   Pain Relieving Factors transitioning slowly           [facility] OT Assessment - 07/18/16 0001      Assessment   Diagnosis CVA  s/p recent lumbar fusion 06/27/16   Referring Provider Dr. 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 Pt reports Lt shoulder surgery approx 10 years ago   Prior Therapy inpatient rehab 07/03/16 - 07/16/16     Precautions   Precautions Back;Other (comment)  no driving at this time   Precaution Comments BLT (No bending, lifting, twisting). No lifting > 5 lbs   Required Braces or Orthoses Spinal Brace  and Rt AFO   Spinal Brace Lumbar corset  when OOB     Balance Screen   Has the patient fallen in the past 6 months No   Has the patient had a decrease in activity level because of a fear of falling?  No   Is the patient reluctant to leave their home because of a fear of falling?  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     ADL   Eating/Feeding Independent   Grooming Independent   Upper Body Bathing Modified independent  seated   Lower Body Bathing Modified independent  seated   Upper Body Dressing Independent   Lower Body Dressing Increased time;Use fo adaptive device;Modified independent   Cytogeneticist Modified independent  with tub bench     IADL   Shopping Completely unable to shop   Light Housekeeping --  not since surgery   Meal Prep Able to complete simple cold meal and snack prep   Community Mobility Relies on family or friends for transportation    Medication Management Is responsible for taking medication in correct dosages at correct time   Development worker, community financial matters independently (budgets, writes checks, pays rent, bills goes to bank), collects and keeps track of income     Mobility   Mobility Status Needs assist   Mobility Status Comments using FWW     Written Expression   Dominant Hand Left     Vision - History   Baseline Vision Wears glasses only for reading     Cognition   Overall Cognitive Status Cognition to be further assessed in functional context PRN     Observation/Other Assessments   Observations Pt reports initial trouble remembering dates but has improved     Sensation   Light Touch Appears Intact  for UE's     Coordination   9 Hole Peg Test Right;Left   Right 9 Hole Peg Test 33.06 sec  Pt reports a little slower from CVA   Left 9 Hole Peg Test 39.47 sec  (slower d/t amputations in last 3 digits)      Edema   Edema none in UE's, but mild Rt foot     ROM / Strength   AROM / PROM / Strength AROM     AROM   Overall AROM Comments BUE shoulder ROM approx. 100* flexion (Lt sh. surgery approx 10 years ago, AND reports Rt shoulder deficits prior to CVA), approx. 75% ER/IR. Elbows distally WNL's. Pt has amputations last 3 digits at PIP joints Lt hand (1984)     Hand Function   Right Hand Grip (lbs) 107 lbs   Left Hand Grip (lbs) 102 lbs                              OT Long Term Goals - 07/18/16 1508      OT LONG TERM GOAL #1   Title Independent with coordination HEP for Rt hand (due 08/18/16)   Time 4   Period Weeks   Status New     OT LONG TERM GOAL #2   Title Pt to stand for 15 minutes w/o rest for simple meal prep demo safety   Time 4   Period Weeks   Status New     OT LONG TERM GOAL #3   Title Pt to safely perform light household tasks in standing adhering to back precautions using A/E prn    Time 4   Period Weeks   Status New     OT LONG TERM  GOAL #4   Title Pt to tolerate 10  min. of UE activity w/o rest to maintain UB strength/endurance   Time 4   Period Weeks   Status New               Plan - 07/18/16 1504    Clinical Impression Statement Pt is a 56 y.o. male who presents to outpatient rehab s/p CVA 07/03/16 with Rt sided weakness, lumbar fusion (L5-S1) on 06/27/16 with current back precautions and lumbar brace. Pt presents with mild deficits in Rt hand coordination, and decreased functional mobility and standing tolerance for ADLS.    Rehab Potential Good   OT Frequency 2x / week   OT Duration 4 weeks  PLUS EVAL   OT Treatment/Interventions Self-care/ADL training;Moist Heat;Fluidtherapy;DME and/or AE instruction;Patient/family education;Therapeutic exercises;Therapeutic activities;Cryotherapy;Neuromuscular education;Functional Mobility Training;Passive range of motion;Energy conservation;Manual Therapy   Plan coordination HEP, functional standing    Consulted and Agree with Plan of Care Patient      Patient will benefit from skilled therapeutic intervention in order to improve the following deficits and impairments:  Decreased coordination, Decreased range of motion, Impaired flexibility, Decreased endurance, Decreased activity tolerance, Decreased knowledge of use of DME, Impaired UE functional use, Pain, Decreased mobility, Decreased strength  Visit Diagnosis: Muscle weakness (generalized) - Plan: Ot plan of care cert/re-cert  Other lack of coordination - Plan: Ot plan of care cert/re-cert  Other abnormalities of gait and mobility - Plan: Ot plan of care cert/re-cert    Problem List Patient Active Problem List   Diagnosis Date Noted  . Small vessel disease, cerebrovascular 07/05/2016  . Right sided weakness 07/03/2016  . Facial droop 07/03/2016  . Stroke (HCC) 07/03/2016  . Anemia   . Tachycardia   . Back pain 06/27/2016    Kelli ChurnBallie, Kaegan Hettich Johnson, OTR/L 07/18/2016, 3:11 PM  [facility] [address] [place], [place], [address] Phone: [phone]   Fax:  [phone]  Name: Esperanza HeirOllis M Winnie MRN: [id number] Date of Birth: March 31, 1961

## 2016-07-22 DIAGNOSIS — D649 Anemia, unspecified: Secondary | ICD-10-CM | POA: Diagnosis not present

## 2016-07-22 DIAGNOSIS — I6340 Cerebral infarction due to embolism of unspecified cerebral artery: Secondary | ICD-10-CM | POA: Diagnosis not present

## 2016-07-22 DIAGNOSIS — S301XXD Contusion of abdominal wall, subsequent encounter: Secondary | ICD-10-CM | POA: Diagnosis not present

## 2016-07-25 DIAGNOSIS — I69398 Other sequelae of cerebral infarction: Secondary | ICD-10-CM | POA: Diagnosis not present

## 2016-07-25 DIAGNOSIS — R2689 Other abnormalities of gait and mobility: Secondary | ICD-10-CM | POA: Diagnosis not present

## 2016-07-25 DIAGNOSIS — R279 Unspecified lack of coordination: Secondary | ICD-10-CM | POA: Diagnosis not present

## 2016-08-01 ENCOUNTER — Telehealth: Payer: Self-pay | Admitting: Physical Therapy

## 2016-08-01 ENCOUNTER — Ambulatory Visit: Payer: 59 | Attending: Physical Medicine & Rehabilitation | Admitting: Physical Therapy

## 2016-08-01 ENCOUNTER — Ambulatory Visit: Payer: 59 | Admitting: Occupational Therapy

## 2016-08-01 DIAGNOSIS — R2689 Other abnormalities of gait and mobility: Secondary | ICD-10-CM | POA: Diagnosis not present

## 2016-08-01 DIAGNOSIS — R279 Unspecified lack of coordination: Secondary | ICD-10-CM | POA: Diagnosis not present

## 2016-08-01 DIAGNOSIS — I69398 Other sequelae of cerebral infarction: Secondary | ICD-10-CM | POA: Diagnosis not present

## 2016-08-01 NOTE — Telephone Encounter (Signed)
left message regarding missed OT/PT appts today and dates/time of next appts. Requested pt call if not able to make it, along with office phone number.  Sallyanne Kuster, PTA, [facility] [address], [address] [place], [place] [address] [phone] 08/01/16, 8:58 AM

## 2016-08-02 ENCOUNTER — Encounter: Payer: 59 | Attending: Physical Medicine & Rehabilitation

## 2016-08-02 ENCOUNTER — Encounter: Payer: Self-pay | Admitting: Physical Medicine & Rehabilitation

## 2016-08-02 ENCOUNTER — Ambulatory Visit (HOSPITAL_BASED_OUTPATIENT_CLINIC_OR_DEPARTMENT_OTHER): Payer: 59 | Admitting: Physical Medicine & Rehabilitation

## 2016-08-02 VITALS — BP 137/89 | HR 101 | Resp 14

## 2016-08-02 DIAGNOSIS — G541 Lumbosacral plexus disorders: Secondary | ICD-10-CM | POA: Diagnosis not present

## 2016-08-02 DIAGNOSIS — R2689 Other abnormalities of gait and mobility: Secondary | ICD-10-CM | POA: Diagnosis not present

## 2016-08-02 DIAGNOSIS — I69351 Hemiplegia and hemiparesis following cerebral infarction affecting right dominant side: Secondary | ICD-10-CM | POA: Diagnosis present

## 2016-08-02 DIAGNOSIS — I69359 Hemiplegia and hemiparesis following cerebral infarction affecting unspecified side: Secondary | ICD-10-CM | POA: Diagnosis not present

## 2016-08-02 DIAGNOSIS — R279 Unspecified lack of coordination: Secondary | ICD-10-CM | POA: Diagnosis not present

## 2016-08-02 DIAGNOSIS — I69398 Other sequelae of cerebral infarction: Secondary | ICD-10-CM | POA: Diagnosis not present

## 2016-08-02 DIAGNOSIS — Z981 Arthrodesis status: Secondary | ICD-10-CM | POA: Diagnosis not present

## 2016-08-02 NOTE — Patient Instructions (Addendum)
Please ask Dr. Shon Baton how long you must wear the lumbar corset

## 2016-08-02 NOTE — Progress Notes (Signed)
Subjective:    Patient ID: Darrell Watson, male    DOB: May 29, 1960, 56 y.o.   MRN: [id number] 56 year old right-handed male with history of recent L5-S1 fusion, anterior approach, June 28, 2016. He lives with spouse, worked in maintenance prior to admission. Presented on July 03, 2016, with right-sided weakness and facial droop with slurred speech.  Cranial CT scan negative.  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  MRA negative.  The patient did not receive tPA.  Echocardiogram with ejection fraction of 50%, no wall motion abnormalities, no emboli identified.  Carotid Dopplers, no ICA stenosis.  Findings of significant decrease in hemoglobin 15.2-8.3. Recent back surgery.  A CT of abdomen and pelvis showed intramuscular hematoma of the left rectus muscle.  Vascular Surgery consulted in regard to hematoma.  DATE OF ADMISSION:  07/05/2016 DATE OF DISCHARGE:  07/16/2016  HPI Back surgery recovery ok, has seen Dr Shon Baton Seen by Dr Nehemiah Settle, asking whether testosterone shots may be resumed, Dressing and bathing self Mod I Speech getting back to normal sometimes has to think of what to say HHPT, OT continues has ~4more weeks scheduled Walks around the house without a cane, [facility] distance with [facility] Pt notes decreased coordination in Right hand Pain Inventory Average Pain 0 Pain Right Now 0 My pain is no pain  In the last 24 hours, has pain interfered with the following? General activity 0 Relation with others 0 Enjoyment of life 0 What TIME of day is your pain at its worst? no pain Sleep (in general) Poor  Pain is worse with: no pain Pain improves with: no pain Relief from Meds: no pain  Mobility walk with assistance use a cane how many minutes can you walk? 20 ability to climb steps?  yes do you drive?  no  Function disabled: date disabled temporary  disability  Neuro/Psych weakness  Prior Studies hospital follow up  Physicians involved in your care hospital follow up   History reviewed. No pertinent family history. Social History   Social History  . Marital status: Married    Spouse name: N/A  . Number of children: N/A  . Years of education: N/A   Social History Main Topics  . Smoking status: Never Smoker  . Smokeless tobacco: Never Used  . Alcohol use Yes     Comment: BEER  OCC  . Drug use: No  . Sexual activity: Not Asked   Other Topics Concern  . None   Social History Narrative  . None   Past Surgical History:  Procedure Laterality Date  . ABDOMINAL EXPOSURE N/A 06/27/2016   Procedure: ABDOMINAL EXPOSURE;  Surgeon: Chuck Hint, MD;  Location: [facility] OR;  Service: Vascular;  Laterality: N/A;  . ANTERIOR LUMBAR FUSION N/A 06/27/2016   Procedure: ANTERIOR LUMBAR FUSION L5-S1;  Surgeon: Venita Lick, MD;  Location: [facility] OR;  Service: Orthopedics;  Laterality: N/A;  Requests 3 hours  . FINGER AMPUTATION Left   . SHOULDER SURGERY     Past Medical History:  Diagnosis Date  . Anemia   . Arthritis   . Tachycardia    BP 137/89 (BP Location: Right Arm, Patient Position: Sitting, Cuff Size: Normal)   Pulse (!) 101   Resp 14   SpO2 98%   Opioid Risk Score:   Fall Risk Score:  `1  Depression screen PHQ 2/9  No flowsheet data found.  Review of Systems  HENT: Negative.  Eyes: Negative.   Respiratory: Negative.   Cardiovascular: Negative.   Gastrointestinal: Negative.   Endocrine: Negative.   Genitourinary: Negative.   Musculoskeletal: Positive for gait problem.  Skin: Negative.   Allergic/Immunologic: Negative.   Neurological: Positive for weakness.  Hematological: Negative.   Psychiatric/Behavioral: Negative.   All other systems reviewed and are negative.      Objective:   Physical Exam  Constitutional: He is oriented to person, place, and time. He appears well-developed and well-nourished.   HENT:  Head: Normocephalic and atraumatic.  Eyes: Conjunctivae and EOM are normal. Pupils are equal, round, and reactive to light.  Neck: Normal range of motion.  Cardiovascular: Normal rate, regular rhythm and normal heart sounds.  Exam reveals no friction rub.   No murmur heard. Pulmonary/Chest: Effort normal and breath sounds normal. No respiratory distress. He has no wheezes.  Abdominal: Soft. Bowel sounds are normal. He exhibits no distension. There is no tenderness.  Neurological: He is alert and oriented to person, place, and time.  Psychiatric: He has a normal mood and affect. His behavior is normal.  Nursing note and vitals reviewed.   5/5 in Bilateral delt bi tri grip Decreased finger to thumb Right hand 4- bilateral HF, 4/5 bilateral KE, 3- R ADF, 5/5 Left ADF Sensation reduced to pp in Right foot    Assessment & Plan:  1. Left PLIC infarct with right hemiparesis affecting the lower limb greater than upper limb. He does have upper limb fine motor weakness on the right. Continue home health PT, OT for 3-4 weeks Anticipate return to driving in about 1 month, after which he may start outpatient PT/OT No return to work yet not sure whether patient will be able to resume his previous job at this point, continue to monitor  2. History of L5-S1 fusion postoperative retroperitoneal hematoma on the left causing some left hip flexor weakness, mild left quad weakness. Overall improving with this, patient will clarify with orthopedic surgeon. However long he must wear lumbar corset

## 2016-08-06 ENCOUNTER — Ambulatory Visit: Payer: 59 | Admitting: Occupational Therapy

## 2016-08-06 ENCOUNTER — Ambulatory Visit: Payer: 59 | Admitting: Physical Therapy

## 2016-08-06 DIAGNOSIS — R2689 Other abnormalities of gait and mobility: Secondary | ICD-10-CM | POA: Diagnosis not present

## 2016-08-06 DIAGNOSIS — R279 Unspecified lack of coordination: Secondary | ICD-10-CM | POA: Diagnosis not present

## 2016-08-06 DIAGNOSIS — I69398 Other sequelae of cerebral infarction: Secondary | ICD-10-CM | POA: Diagnosis not present

## 2016-08-08 ENCOUNTER — Telehealth: Payer: Self-pay | Admitting: Occupational Therapy

## 2016-08-08 ENCOUNTER — Ambulatory Visit: Payer: 59 | Admitting: Occupational Therapy

## 2016-08-08 NOTE — Telephone Encounter (Signed)
Therapist called and left message re: missed appointments and to call outpatient rehab back. Contact phone number provided

## 2016-08-09 DIAGNOSIS — R279 Unspecified lack of coordination: Secondary | ICD-10-CM | POA: Diagnosis not present

## 2016-08-09 DIAGNOSIS — I69398 Other sequelae of cerebral infarction: Secondary | ICD-10-CM | POA: Diagnosis not present

## 2016-08-09 DIAGNOSIS — R2689 Other abnormalities of gait and mobility: Secondary | ICD-10-CM | POA: Diagnosis not present

## 2016-08-12 ENCOUNTER — Ambulatory Visit: Payer: 59 | Admitting: Physical Therapy

## 2016-08-12 ENCOUNTER — Ambulatory Visit: Payer: 59 | Admitting: Occupational Therapy

## 2016-08-13 ENCOUNTER — Ambulatory Visit: Payer: Self-pay | Admitting: Physical Therapy

## 2016-08-13 ENCOUNTER — Encounter: Payer: Self-pay | Admitting: Occupational Therapy

## 2016-08-13 DIAGNOSIS — G8929 Other chronic pain: Secondary | ICD-10-CM | POA: Diagnosis not present

## 2016-08-13 DIAGNOSIS — I69398 Other sequelae of cerebral infarction: Secondary | ICD-10-CM | POA: Diagnosis not present

## 2016-08-13 DIAGNOSIS — M5442 Lumbago with sciatica, left side: Secondary | ICD-10-CM | POA: Diagnosis not present

## 2016-08-13 DIAGNOSIS — R2689 Other abnormalities of gait and mobility: Secondary | ICD-10-CM | POA: Diagnosis not present

## 2016-08-13 DIAGNOSIS — R279 Unspecified lack of coordination: Secondary | ICD-10-CM | POA: Diagnosis not present

## 2016-08-16 DIAGNOSIS — R2689 Other abnormalities of gait and mobility: Secondary | ICD-10-CM | POA: Diagnosis not present

## 2016-08-16 DIAGNOSIS — I69398 Other sequelae of cerebral infarction: Secondary | ICD-10-CM | POA: Diagnosis not present

## 2016-08-16 DIAGNOSIS — R279 Unspecified lack of coordination: Secondary | ICD-10-CM | POA: Diagnosis not present

## 2016-08-19 ENCOUNTER — Encounter: Payer: Self-pay | Admitting: Occupational Therapy

## 2016-08-19 ENCOUNTER — Ambulatory Visit: Payer: Self-pay | Admitting: Diagnostic Neuroimaging

## 2016-08-19 NOTE — Therapy (Signed)
[facility] [address] [place], [place], [address] Phone: [phone]   Fax:  [phone]  Patient Details  Name: NIHAAL FRIESEN MRN: [id number] Date of Birth: Apr 20, 1961 Referring Provider:  Dr. Claudette Laws Encounter Date: 08/19/2016  Pt has not returned since initial evaluation on 07/18/16. Therapist called pt re: missed appointments, and pt called back on 08/14/16 stating he has been receiving home health for approx. 1 month. Will d/c at this time  Kelli Churn, OTR/L 08/19/2016, 8:16 AM  [facility] [address] [place], [place], [address] Phone: [phone]   Fax:  [phone]

## 2016-08-20 ENCOUNTER — Ambulatory Visit: Payer: 59 | Admitting: Physical Therapy

## 2016-08-20 ENCOUNTER — Encounter: Payer: Self-pay | Admitting: Occupational Therapy

## 2016-08-20 ENCOUNTER — Encounter: Payer: Self-pay | Admitting: Diagnostic Neuroimaging

## 2016-08-20 ENCOUNTER — Ambulatory Visit (INDEPENDENT_AMBULATORY_CARE_PROVIDER_SITE_OTHER): Payer: 59 | Admitting: Diagnostic Neuroimaging

## 2016-08-20 VITALS — BP 142/82 | HR 100 | Ht 70.0 in | Wt 183.8 lb

## 2016-08-20 DIAGNOSIS — I10 Essential (primary) hypertension: Secondary | ICD-10-CM

## 2016-08-20 DIAGNOSIS — I639 Cerebral infarction, unspecified: Secondary | ICD-10-CM | POA: Diagnosis not present

## 2016-08-20 DIAGNOSIS — G4700 Insomnia, unspecified: Secondary | ICD-10-CM | POA: Diagnosis not present

## 2016-08-20 DIAGNOSIS — R0683 Snoring: Secondary | ICD-10-CM | POA: Diagnosis not present

## 2016-08-20 NOTE — Progress Notes (Signed)
[facility]  PATIENT: ERNESTINE LANGWORTHY DOB: 1960/10/18  REFERRING CLINICIAN: D Angiulli, PA  HISTORY FROM: patient  REASON FOR VISIT: new consult    HISTORICAL  CHIEF COMPLAINT:  Chief Complaint  Patient presents with  . NP [facility]  . Hospitalization Follow-up    1 mo post stroke f/u, using cane, discharged from [facility], Dr. Shon Baton (ortho) aquatics PT for back.   . Cerebrovascular Accident    HISTORY OF PRESENT ILLNESS:   56 year old right-handed male with L5-S1 fusion on 06/28/2016, had sudden onset right leg weakness and facial weakness on 07/03/2016. Patient presented to the hospital was admitted for stroke workup. Patient was found to have small acute ischemic infarction in left posterior limb of internal capsule and a punctate right anterior temporal ischemic infarction. No other stroke risk factors were identified.  Patient was found to have significant decline in hemoglobin as well as intramuscular hematoma in the left psoas and left rectus muscles.  Since that time patient has done well. He is continuing to do therapy and plans to transition to water therapy.  Patient reports history of interrupted sleep, snoring, daytime fatigue.  Patient reports drinking 1-2 alcoholic drinks per month.  Patient is continuing on aspirin 81 mg daily.   REVIEW OF SYSTEMS: Full 14 system review of systems performed and negative with exception of: Cold intolerance numbness insomnia.  ALLERGIES: No Known Allergies  HOME MEDICATIONS: Outpatient Medications Prior to Visit  Medication Sig Dispense Refill  . aspirin EC 81 MG EC tablet Take 1 tablet (81 mg total) by mouth daily.    Marland Kitchen acetaminophen (TYLENOL) 325 MG tablet Take 650 mg by mouth every 6 (six) hours as needed (for pain.).    Marland Kitchen methocarbamol (ROBAXIN) 500 MG tablet Take 1 tablet (500 mg total) by mouth 3 (three) times daily as needed for muscle spasms. (Patient not taking: Reported on 08/20/2016) 60 tablet 0  .  testosterone cypionate (DEPOTESTOSTERONE CYPIONATE) 200 MG/ML injection Inject 300 mg into the muscle every 14 (fourteen) days. Gets 1.5 ml at Dr office every 2 weeks Next dose is due 06-24-2016    . traMADol (ULTRAM) 50 MG tablet Take 1 tablet (50 mg total) by mouth every 6 (six) hours as needed for moderate pain. (Patient not taking: Reported on 08/20/2016) 30 tablet 0  . traZODone (DESYREL) 50 MG tablet Take 1 tablet (50 mg total) by mouth at bedtime as needed for sleep. 30 tablet 0   No facility-administered medications prior to visit.     PAST MEDICAL HISTORY: Past Medical History:  Diagnosis Date  . Anemia   . Arthritis   . Tachycardia     PAST SURGICAL HISTORY: Past Surgical History:  Procedure Laterality Date  . ABDOMINAL EXPOSURE N/A 06/27/2016   Procedure: ABDOMINAL EXPOSURE;  Surgeon: Chuck Hint, MD;  Location: [facility] OR;  Service: Vascular;  Laterality: N/A;  . ANTERIOR LUMBAR FUSION N/A 06/27/2016   Procedure: ANTERIOR LUMBAR FUSION L5-S1;  Surgeon: Venita Lick, MD;  Location: [facility] OR;  Service: Orthopedics;  Laterality: N/A;  Requests 3 hours  . FINGER AMPUTATION Left   . SHOULDER SURGERY      FAMILY HISTORY: History reviewed. No pertinent family history.  SOCIAL HISTORY:  Social History   Social History  . Marital status: Married    Spouse name: N/A  . Number of children: N/A  . Years of education: N/A   Occupational History  . Not on file.   Social History Main Topics  .  Smoking status: Never Smoker  . Smokeless tobacco: Never Used  . Alcohol use Yes     Comment: BEER  OCC  . Drug use: No  . Sexual activity: Not on file   Other Topics Concern  . Not on file   Social History Narrative  . No narrative on file     PHYSICAL EXAM  GENERAL EXAM/CONSTITUTIONAL: Vitals:  Vitals:   08/20/16 1456  BP: (!) 142/82  Pulse: 100  Weight: 183 lb 12.8 oz (83.4 kg)  Height:  (1.778 m)     Body mass index is 26.37 kg/m.  No exam data  present  Patient is in no distress; well developed, nourished and groomed; neck is supple  LEFT HAND DIGITS 3-5 AMPUTATIONS   CARDIOVASCULAR:  Examination of carotid arteries is normal; no carotid bruits  Regular rate and rhythm, no murmurs  Examination of peripheral vascular system by observation and palpation is normal  EYES:  Ophthalmoscopic exam of optic discs and posterior segments is normal; no papilledema or hemorrhages  MUSCULOSKELETAL:  Gait, strength, tone, movements noted in Neurologic exam below  NEUROLOGIC: MENTAL STATUS:  No flowsheet data found.  awake, alert, oriented to person, place and time  recent and remote memory intact  normal attention and concentration  language fluent, comprehension intact, naming intact,   fund of knowledge appropriate  CRANIAL NERVE:   2nd - no papilledema on fundoscopic exam  2nd, 3rd, 4th, 6th - pupils equal and reactive to light, visual fields full to confrontation, extraocular muscles intact, no nystagmus  5th - facial sensation symmetric  7th - facial strength symmetric  8th - hearing intact  9th - palate elevates symmetrically, uvula midline  11th - shoulder shrug symmetric  12th - tongue protrusion midline  MOTOR:   normal bulk and tone, full strength in the BUE, BLE  SENSORY:   normal and symmetric to light touch, temperature, vibration  COORDINATION:   finger-nose-finger, fine finger movements normal  REFLEXES:   deep tendon reflexes present and symmetric; TRACE AT ANKLES  GAIT/STATION:   narrow based gait; USES SINGLE POINT CANE    DIAGNOSTIC DATA (LABS, IMAGING, TESTING) - I reviewed patient records, labs, notes, testing and imaging myself where available.  Lab Results  Component Value Date   WBC 6.6 07/11/2016   HGB 11.2 (L) 07/11/2016   HCT 35.5 (L) 07/11/2016   MCV 87.0 07/11/2016   PLT 406 (H) 07/11/2016      Component Value Date/Time   NA 137 07/11/2016 0436   NA 137  03/05/2012 1351   K 4.1 07/11/2016 0436   K 4.0 03/05/2012 1351   CL 103 07/11/2016 0436   CL 104 03/05/2012 1351   CO2 28 07/11/2016 0436   CO2 28 03/05/2012 1351   GLUCOSE 89 07/11/2016 0436   GLUCOSE 79 03/05/2012 1351   BUN 10 07/11/2016 0436   BUN 13.0 03/05/2012 1351   CREATININE 1.16 07/12/2016 0620   CREATININE 1.4 (H) 03/05/2012 1351   CALCIUM 8.7 (L) 07/11/2016 0436   CALCIUM 9.6 03/05/2012 1351   PROT 6.4 (L) 07/08/2016 0412   PROT 7.4 03/05/2012 1351   ALBUMIN 3.1 (L) 07/08/2016 0412   ALBUMIN 4.2 03/05/2012 1351   AST 26 07/08/2016 0412   AST 33 03/05/2012 1351   ALT 19 07/08/2016 0412   ALT 37 03/05/2012 1351   ALKPHOS 46 07/08/2016 0412   ALKPHOS 62 03/05/2012 1351   BILITOT 1.7 (H) 07/08/2016 0412   BILITOT 0.47 03/05/2012 1351  GFRNONAA >60 07/12/2016 0620   GFRAA >60 07/12/2016 0620   Lab Results  Component Value Date   CHOL 106 07/03/2016   HDL 45 07/03/2016   LDLCALC 39 07/03/2016   TRIG 111 07/03/2016   CHOLHDL 2.4 07/03/2016   Lab Results  Component Value Date   HGBA1C 6.5 (H) 07/03/2016   Lab Results  Component Value Date   VITAMINB12 547 07/04/2016   No results found for: TSH   MRA  Unremarkable   Carotid Doppler  1-39% B ICA, VA antegrade  2D Echo  pending   LDL 39  HgbA1c 6.5  07/03/16 MRI brain 1. 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 No associated hemorrhage. Different vascular territories suggests embolic etiology. 2. T2 hyperintense focus in right hemi pons, possibly chronic lacunar infarct or prominent perivascular space.  3. Mild paranasal sinus disease.  4. Dermal lesion in right superolateral scalp overlying parietal bone, direct visualization recommended.   07/03/16 MRA head  - normal   07/04/16 carotid u/s - Intimal wall thickening CCA. Mild soft plaque origin ICA. 1-39% ICA plaquing. Vertebral artery flow is  antegrade.  07/04/16 TTE - No cardiac source of emboli was indentified.   ASSESSMENT AND PLAN  56 y.o. year old male here with left posterior limb internal capsule stroke (small vessel thrombosis) and punctate right temporal operculum stroke (cryptogenic). He did not receive IV t-PA due to delay in arrival.   Dx:  Cryptogenic stroke (HCC) - Plan: Cardiac event monitor, Ambulatory referral to Sleep Studies  Hypertension, unspecified type - Plan: Cardiac event monitor, Ambulatory referral to Sleep Studies  Snoring - Plan: Cardiac event monitor, Ambulatory referral to Sleep Studies  Insomnia, unspecified type    PLAN:  - check cardiac heart monitor (rule out paroxysmal atrial fibrillation)  - check sleep study  - continue aspirin  daily   - no neurologic contraindication for testosterone replacement therapy  - follow up with PCP re: BP control and sinus tachycardia  - follow up with stroke NP one more visit, and then as needed   Orders Placed This Encounter  Procedures  . Ambulatory referral to Sleep Studies  . Cardiac event monitor   Return in about 3 months (around 11/19/2016) for with NP Enid Skeens).    Suanne Marker, MD 08/20/2016, 3:12 PM Certified in Neurology, Neurophysiology and Neuroimaging  [facility] [address], [address] [place], [place] [address] [phone]

## 2016-08-20 NOTE — Patient Instructions (Signed)
-   check cardiac heart monitor (rule out paroxysmal atrial fibrillation)  - check sleep study  - continue aspirin  daily   - no neurologic contraindication for testosterone replacement therapy  - follow up with PCP re: BP control and sinus tachycardia  - follow up with stroke NP one more visit, and then as needed

## 2016-08-22 ENCOUNTER — Encounter: Payer: Self-pay | Admitting: Occupational Therapy

## 2016-08-22 ENCOUNTER — Ambulatory Visit: Payer: Self-pay | Admitting: Physical Therapy

## 2016-08-22 DIAGNOSIS — E291 Testicular hypofunction: Secondary | ICD-10-CM | POA: Diagnosis not present

## 2016-08-26 DIAGNOSIS — M6281 Muscle weakness (generalized): Secondary | ICD-10-CM | POA: Diagnosis not present

## 2016-08-26 DIAGNOSIS — R2689 Other abnormalities of gait and mobility: Secondary | ICD-10-CM | POA: Diagnosis not present

## 2016-08-28 ENCOUNTER — Other Ambulatory Visit: Payer: Self-pay | Admitting: Diagnostic Neuroimaging

## 2016-08-28 ENCOUNTER — Telehealth: Payer: Self-pay | Admitting: *Deleted

## 2016-08-28 ENCOUNTER — Ambulatory Visit (INDEPENDENT_AMBULATORY_CARE_PROVIDER_SITE_OTHER): Payer: 59

## 2016-08-28 DIAGNOSIS — I639 Cerebral infarction, unspecified: Secondary | ICD-10-CM | POA: Diagnosis not present

## 2016-08-28 DIAGNOSIS — R0683 Snoring: Secondary | ICD-10-CM

## 2016-08-28 DIAGNOSIS — I4891 Unspecified atrial fibrillation: Secondary | ICD-10-CM

## 2016-08-28 DIAGNOSIS — I10 Essential (primary) hypertension: Secondary | ICD-10-CM

## 2016-08-28 NOTE — Telephone Encounter (Signed)
Discussed [employer] papers with Dr Marjory Lies. Dr Marjory Lies recommends patient have Dr Carleene Overlie complete papers based on Dr Wynn Banker office note 08/02/16. Advised patient that Dr Wynn Banker will need to do a functions and abilities test following his therapies and  prior to him returning to work as this is the information the [place] papers are requiring. Gave pt number for Dr Jodean Lima office. Patient stated to shred the papers this RN has from [place].  Patient verbalized understanding, appreciation of call.

## 2016-08-28 NOTE — Telephone Encounter (Signed)
Called patient for clarification re: [employer] papers from [place] and [place].  Patient stated he is unable to return to work; he can hardly walk. He is currently taking therapy ordered by Dr Nehemiah Settle for his back surgery which took place 6 days prior to his stroke.  He will be in therapy for the next 4 weeks. His next FU in this office is 11/19/16.  He stated he is more concerned with recovering from his stroke than his back surgery.  This RN advised will discuss paperwork with Dr Marjory Lies. When ready, MR dept will call him. He verbalized understanding, appreciation.

## 2016-08-28 NOTE — Telephone Encounter (Signed)
Pt called said [employer] called him today to find out when he will return to work and he should pay the $50 fee for the disability paperwork. Pt understands he will pay the fee and MR will call him when it is ready.  FYI

## 2016-08-29 DIAGNOSIS — M6281 Muscle weakness (generalized): Secondary | ICD-10-CM | POA: Diagnosis not present

## 2016-08-29 DIAGNOSIS — R2689 Other abnormalities of gait and mobility: Secondary | ICD-10-CM | POA: Diagnosis not present

## 2016-08-30 ENCOUNTER — Encounter: Payer: 59 | Attending: Physical Medicine & Rehabilitation

## 2016-08-30 ENCOUNTER — Ambulatory Visit (HOSPITAL_BASED_OUTPATIENT_CLINIC_OR_DEPARTMENT_OTHER): Payer: 59 | Admitting: Physical Medicine & Rehabilitation

## 2016-08-30 ENCOUNTER — Encounter: Payer: Self-pay | Admitting: Physical Medicine & Rehabilitation

## 2016-08-30 VITALS — BP 129/88 | HR 106

## 2016-08-30 DIAGNOSIS — I69351 Hemiplegia and hemiparesis following cerebral infarction affecting right dominant side: Secondary | ICD-10-CM | POA: Insufficient documentation

## 2016-08-30 DIAGNOSIS — I69359 Hemiplegia and hemiparesis following cerebral infarction affecting unspecified side: Secondary | ICD-10-CM

## 2016-08-30 DIAGNOSIS — Z981 Arthrodesis status: Secondary | ICD-10-CM | POA: Insufficient documentation

## 2016-08-30 DIAGNOSIS — I69398 Other sequelae of cerebral infarction: Secondary | ICD-10-CM | POA: Diagnosis not present

## 2016-08-30 DIAGNOSIS — R269 Unspecified abnormalities of gait and mobility: Secondary | ICD-10-CM

## 2016-08-30 NOTE — Progress Notes (Signed)
Subjective:    Patient ID: Darrell Watson, male    DOB: 08/02/1960, 56 y.o.   MRN: [id number] Presented on July 03, 2016, with right-sided weakness and facial droop with slurred speech.  Cranial CT scan negative.  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  MRA negative.  The patient did not receive tPA.  Echocardiogram with ejection fraction of 50%, no wall motion abnormalities, no emboli identified.  Carotid Dopplers, no ICA stenosis.  Findings of significant decrease in hemoglobin 15.2-8.3. Recent back surgery.  A CT of abdomen and pelvis showed intramuscular hematoma of the left rectus muscle.  Vascular Surgery consulted in regard to hematoma.  DATE OF ADMISSION:  07/05/2016 DATE OF DISCHARGE:  07/16/2016 HPI  Outpt therapy at [facility] working on RLE strengthening Going 2x per week.  Doing HEP as well  Walking still not back to normal  Back pain hurts in the am, no longer wearing brace during driving Ortho stated xrays look ok.  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  Lifting and carrying 25lb.  Needs to climb stairs and ladders  Patient states that while he has his ups and downs, he does not feel like he needs to talk to a psychologist at this point.   Pain Inventory Average Pain 4 Pain Right Now 4 My pain is stabbing  In the last 24 hours, has pain interfered with the following? General activity 4 Relation with others 4 Enjoyment of life 4 What TIME of day is your pain at its worst? morning Sleep (in general) Poor  Pain is worse with: bending, sitting and standing Pain improves with: therapy/exercise and pacing activities Relief from Meds: no pain meds  Mobility use a cane how many minutes can you walk? 60 ability to climb steps?  yes do you drive?  yes  Function not employed: date last  employed .  Neuro/Psych trouble walking  Prior Studies Any changes since last visit?  no  Physicians involved in your care Any changes since last visit?  no   No family history on file. Social History   Social History  . Marital status: Married    Spouse name: N/A  . Number of children: N/A  . Years of education: N/A   Social History Main Topics  . Smoking status: Never Smoker  . Smokeless tobacco: Never Used  . Alcohol use Yes     Comment: BEER  OCC  . Drug use: No  . Sexual activity: Not on file   Other Topics Concern  . Not on file   Social History Narrative  . No narrative on file   Past Surgical History:  Procedure Laterality Date  . ABDOMINAL EXPOSURE N/A 06/27/2016   Procedure: ABDOMINAL EXPOSURE;  Surgeon: Chuck Hint, MD;  Location: [facility] OR;  Service: Vascular;  Laterality: N/A;  . ANTERIOR LUMBAR FUSION N/A 06/27/2016   Procedure: ANTERIOR LUMBAR FUSION L5-S1;  Surgeon: Venita Lick, MD;  Location: [facility] OR;  Service: Orthopedics;  Laterality: N/A;  Requests 3 hours  . FINGER AMPUTATION Left   . SHOULDER SURGERY     Past Medical History:  Diagnosis Date  . Anemia   . Arthritis   . Tachycardia    There were no vitals taken for this visit.  Opioid Risk Score:   Fall Risk Score:  `1  Depression screen  PHQ 2/9  Depression screen PHQ 2/9 08/02/2016  Decreased Interest 0  Down, Depressed, Hopeless 0  PHQ - 2 Score 0  Altered sleeping 0  Tired, decreased energy 0  Change in appetite 0  Feeling bad or failure about yourself  0  Trouble concentrating 0  Moving slowly or fidgety/restless 0  Suicidal thoughts 0  PHQ-9 Score 0  Difficult doing work/chores Not difficult at all     Review of Systems  Constitutional: Negative.   HENT: Negative.   Eyes: Negative.   Respiratory: Negative.   Cardiovascular: Negative.   Gastrointestinal: Negative.   Endocrine: Negative.   Genitourinary: Negative.   Musculoskeletal: Positive for back pain.    Skin: Negative.   Allergic/Immunologic: Negative.   Neurological: Negative.   Hematological: Negative.   Psychiatric/Behavioral: Negative.        Objective:   Physical Exam  Constitutional: He is oriented to person, place, and time. He appears well-developed and well-nourished.  HENT:  Head: Normocephalic and atraumatic.  Eyes: Conjunctivae and EOM are normal. Pupils are equal, round, and reactive to light.  Neck: Normal range of motion.  Neurological: He is alert and oriented to person, place, and time. No cranial nerve deficit or sensory deficit. Gait abnormal. Coordination normal.  Motor strength is 4/5 in the right deltoid, bicep, tricep, grip 4 minus in the right hip flexor, knee extensor, ankle dorsiflexor 5/5 in the left deltoid, bicep, tricep, grip, hip flexor, knee extensor, ankle dorsal flexor. Sensation intact to light touch bilateral upper and lower limbs  Gait is wide-based, stiff leg gait, right lower extremity. Very small step length and slow cadence  Psychiatric: He has a normal mood and affect. His behavior is normal. Judgment and thought content normal.  Nursing note and vitals reviewed.         Assessment & Plan:  1. Left PL IC infarct with residual right hemiparesis. He is approximately 2 months post infarct and is making good progress. He still is unable to perform his job activities as outlined above. Lifting and carrying are major obstacles. He will continue with outpatient therapy, he may be ready to start some job simulation next month. Continues to progress. 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

## 2016-08-30 NOTE — Patient Instructions (Signed)
No work until MD clearance

## 2016-09-02 DIAGNOSIS — R2689 Other abnormalities of gait and mobility: Secondary | ICD-10-CM | POA: Diagnosis not present

## 2016-09-02 DIAGNOSIS — M6281 Muscle weakness (generalized): Secondary | ICD-10-CM | POA: Diagnosis not present

## 2016-09-05 DIAGNOSIS — M6281 Muscle weakness (generalized): Secondary | ICD-10-CM | POA: Diagnosis not present

## 2016-09-05 DIAGNOSIS — E291 Testicular hypofunction: Secondary | ICD-10-CM | POA: Diagnosis not present

## 2016-09-05 DIAGNOSIS — R2689 Other abnormalities of gait and mobility: Secondary | ICD-10-CM | POA: Diagnosis not present

## 2016-09-09 DIAGNOSIS — R2689 Other abnormalities of gait and mobility: Secondary | ICD-10-CM | POA: Diagnosis not present

## 2016-09-09 DIAGNOSIS — M6281 Muscle weakness (generalized): Secondary | ICD-10-CM | POA: Diagnosis not present

## 2016-09-12 DIAGNOSIS — R2689 Other abnormalities of gait and mobility: Secondary | ICD-10-CM | POA: Diagnosis not present

## 2016-09-12 DIAGNOSIS — M6281 Muscle weakness (generalized): Secondary | ICD-10-CM | POA: Diagnosis not present

## 2016-09-16 DIAGNOSIS — R2689 Other abnormalities of gait and mobility: Secondary | ICD-10-CM | POA: Diagnosis not present

## 2016-09-16 DIAGNOSIS — M6281 Muscle weakness (generalized): Secondary | ICD-10-CM | POA: Diagnosis not present

## 2016-09-19 DIAGNOSIS — M6281 Muscle weakness (generalized): Secondary | ICD-10-CM | POA: Diagnosis not present

## 2016-09-19 DIAGNOSIS — R2689 Other abnormalities of gait and mobility: Secondary | ICD-10-CM | POA: Diagnosis not present

## 2016-09-19 DIAGNOSIS — E291 Testicular hypofunction: Secondary | ICD-10-CM | POA: Diagnosis not present

## 2016-09-24 ENCOUNTER — Institutional Professional Consult (permissible substitution): Payer: 59 | Admitting: Neurology

## 2016-09-24 DIAGNOSIS — G8929 Other chronic pain: Secondary | ICD-10-CM | POA: Diagnosis not present

## 2016-09-24 DIAGNOSIS — M5442 Lumbago with sciatica, left side: Secondary | ICD-10-CM | POA: Diagnosis not present

## 2016-09-24 DIAGNOSIS — Z4789 Encounter for other orthopedic aftercare: Secondary | ICD-10-CM | POA: Diagnosis not present

## 2016-09-26 DIAGNOSIS — R2689 Other abnormalities of gait and mobility: Secondary | ICD-10-CM | POA: Diagnosis not present

## 2016-09-26 DIAGNOSIS — M6281 Muscle weakness (generalized): Secondary | ICD-10-CM | POA: Diagnosis not present

## 2016-09-27 ENCOUNTER — Encounter: Payer: 59 | Attending: Physical Medicine & Rehabilitation

## 2016-09-27 ENCOUNTER — Ambulatory Visit: Payer: Self-pay | Admitting: Physical Medicine & Rehabilitation

## 2016-09-27 DIAGNOSIS — Z981 Arthrodesis status: Secondary | ICD-10-CM | POA: Insufficient documentation

## 2016-09-27 DIAGNOSIS — I69351 Hemiplegia and hemiparesis following cerebral infarction affecting right dominant side: Secondary | ICD-10-CM | POA: Insufficient documentation

## 2016-09-27 NOTE — Addendum Note (Signed)
Addendum  created 09/27/16 1048 by Tyffany Waldrop D, MD   Sign clinical note    

## 2016-10-01 DIAGNOSIS — M6281 Muscle weakness (generalized): Secondary | ICD-10-CM | POA: Diagnosis not present

## 2016-10-01 DIAGNOSIS — R2689 Other abnormalities of gait and mobility: Secondary | ICD-10-CM | POA: Diagnosis not present

## 2016-10-03 DIAGNOSIS — M6281 Muscle weakness (generalized): Secondary | ICD-10-CM | POA: Diagnosis not present

## 2016-10-03 DIAGNOSIS — R2689 Other abnormalities of gait and mobility: Secondary | ICD-10-CM | POA: Diagnosis not present

## 2016-10-04 ENCOUNTER — Ambulatory Visit (HOSPITAL_BASED_OUTPATIENT_CLINIC_OR_DEPARTMENT_OTHER): Payer: 59 | Admitting: Physical Medicine & Rehabilitation

## 2016-10-04 ENCOUNTER — Encounter: Payer: Self-pay | Admitting: Physical Medicine & Rehabilitation

## 2016-10-04 VITALS — BP 134/88 | HR 105

## 2016-10-04 DIAGNOSIS — I69351 Hemiplegia and hemiparesis following cerebral infarction affecting right dominant side: Secondary | ICD-10-CM | POA: Diagnosis present

## 2016-10-04 DIAGNOSIS — E291 Testicular hypofunction: Secondary | ICD-10-CM | POA: Diagnosis not present

## 2016-10-04 DIAGNOSIS — I69359 Hemiplegia and hemiparesis following cerebral infarction affecting unspecified side: Secondary | ICD-10-CM

## 2016-10-04 DIAGNOSIS — Z981 Arthrodesis status: Secondary | ICD-10-CM | POA: Diagnosis not present

## 2016-10-04 NOTE — Patient Instructions (Addendum)
Main issue is stroke, referral to neuro rehab for PT/OT  Not able to work at prior job

## 2016-10-04 NOTE — Progress Notes (Signed)
Subjective:    Patient ID: Darrell Watson, male    DOB: 1960-10-10, 56 y.o.   MRN: [id number]  HPI Low back doing ok, 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 Home health therapy, started on aquatic therapy per orthopedics for his post-lumbar fusion rehabilitation. His physical therapist told him that land-based therapy would be more helpful. Patient would like my input on this. He also asked me to contact his orthopedic surgeon, which I have done.  Independent with all bathing, dressing and mobility. Not using a cane or AFO. Still feels weak in the right leg greater than left lower extremity as well as the right upper extremity.  Pain Inventory Average Pain 4 Pain Right Now 3 My pain is sharp  In the last 24 hours, has pain interfered with the following? General activity 4 Relation with others 4 Enjoyment of life 4 What TIME of day is your pain at its worst? morning Sleep (in general) Fair  Pain is worse with: bending Pain improves with: . Relief from Meds: na  Mobility walk without assistance ability to climb steps?  yes do you drive?  yes  Function disabled: date disabled 3/18  Neuro/Psych bladder control problems weakness numbness tremor trouble walking  Prior Studies Any changes since last visit?  no  Physicians involved in your care Any changes since last visit?  no   No family history on file. Social History   Social History  . Marital status: Married    Spouse name: N/A  . Number of children: N/A  . Years of education: N/A   Social History Main Topics  . Smoking status: Never Smoker  . Smokeless tobacco: Never Used  . Alcohol use Yes     Comment: BEER  OCC  . Drug use: No  . Sexual activity: Not on file   Other Topics Concern  . Not on file   Social History Narrative  . No narrative on file   Past Surgical History:    Procedure Laterality Date  . ABDOMINAL EXPOSURE N/A 06/27/2016   Procedure: ABDOMINAL EXPOSURE;  Surgeon: Chuck Hinthristopher S Dickson, MD;  [facility];  Service: Vascular;  Laterality: N/A;  . ANTERIOR LUMBAR FUSION N/A 06/27/2016   Procedure: ANTERIOR LUMBAR FUSION L5-S1;  Surgeon: Venita Lickahari Brooks, MD;  Location: [facility] OR;  Service: Orthopedics;  Laterality: N/A;  Requests 3 hours  . FINGER AMPUTATION Left   . SHOULDER SURGERY     Past Medical History:  Diagnosis Date  . Anemia   . Arthritis   . Tachycardia    There were no vitals taken for this visit.  Opioid Risk Score:   Fall Risk Score:  `1  Depression screen PHQ 2/9  Depression screen PHQ 2/9 08/02/2016  Decreased Interest 0  Down, Depressed, Hopeless 0  PHQ - 2 Score 0  Altered sleeping 0  Tired, decreased energy 0  Change in appetite 0  Feeling bad or failure about yourself  0  Trouble concentrating 0  Moving slowly or fidgety/restless 0  Suicidal thoughts 0  PHQ-9 Score 0  Difficult doing work/chores Not difficult at all    Review of Systems  Constitutional: Negative.   HENT: Negative.   Eyes: Negative.   Respiratory: Negative.   Cardiovascular: Negative.   Gastrointestinal: Negative.   Endocrine: Negative.   Genitourinary: Negative.   Musculoskeletal: Negative.  Skin: Negative.   Allergic/Immunologic: Negative.   Neurological: Negative.   Hematological: Negative.   Psychiatric/Behavioral: Negative.   All other systems reviewed and are negative.      Objective:   Physical Exam  Constitutional: He is oriented to person, place, and time. He appears well-developed and well-nourished.  HENT:  Head: Normocephalic and atraumatic.  Eyes: Conjunctivae and EOM are normal. Pupils are equal, round, and reactive to light.  Neck: Normal range of motion.  Neurological: He is alert and oriented to person, place, and time.  Motor strength is 4 minus in the right deltoid, biceps, triceps, grip as well as hip flexors,  knee extensors, 3 plus right ankle dorsiflexor. Left hip flexor 4/5, knee extensor 5/5. Ankle dorsi flexion 5/5 left deltoid by stress at 5/5. Grip is 5 minus, mainly due to history of amputations of the third, fourth and fifth digits on the left side.  Skin: Skin is warm and dry.  Nursing note and vitals reviewed.  sensation intact pinprick bilateral upper and lower limbs        Assessment & Plan:  1. 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  2. History of lumbar fusion, at this point this is not his main source of disability. Would concentrate on stroke related deficits first.  Physical medicine and rehabilitation follow-up 6 weeks

## 2016-10-08 DIAGNOSIS — M6281 Muscle weakness (generalized): Secondary | ICD-10-CM | POA: Diagnosis not present

## 2016-10-08 DIAGNOSIS — R2689 Other abnormalities of gait and mobility: Secondary | ICD-10-CM | POA: Diagnosis not present

## 2016-10-18 DIAGNOSIS — E291 Testicular hypofunction: Secondary | ICD-10-CM | POA: Diagnosis not present

## 2016-10-21 ENCOUNTER — Encounter: Payer: Self-pay | Admitting: Physical Therapy

## 2016-10-21 ENCOUNTER — Ambulatory Visit: Payer: 59 | Attending: Physical Medicine & Rehabilitation | Admitting: Physical Therapy

## 2016-10-21 DIAGNOSIS — I69353 Hemiplegia and hemiparesis following cerebral infarction affecting right non-dominant side: Secondary | ICD-10-CM | POA: Diagnosis present

## 2016-10-21 DIAGNOSIS — R2689 Other abnormalities of gait and mobility: Secondary | ICD-10-CM

## 2016-10-21 DIAGNOSIS — M6281 Muscle weakness (generalized): Secondary | ICD-10-CM

## 2016-10-21 DIAGNOSIS — M545 Low back pain, unspecified: Secondary | ICD-10-CM

## 2016-10-21 DIAGNOSIS — R2681 Unsteadiness on feet: Secondary | ICD-10-CM | POA: Diagnosis present

## 2016-10-21 NOTE — Therapy (Signed)
[facility] [address] [place], [place], [address] Phone: [phone]   Fax:  [phone]  Physical Therapy Evaluation  Patient Details  Name: Darrell Watson MRN: [id number] Date of Birth: 1961/04/01 Referring Provider: Erick Colace, MD  Encounter Date: 10/21/2016      PT End of Session - 10/21/16 2041    Visit Number 1   Number of Visits 17   Date for PT Re-Evaluation 12/20/16   Authorization Type U[employer]-60 visit limit combined   PT Start Time 0800   PT Stop Time 0847   PT Time Calculation (min) 47 min   Activity Tolerance Patient tolerated treatment well   Behavior During Therapy [facility] for tasks assessed/performed      Past Medical History:  Diagnosis Date  . Anemia   . Arthritis   . Tachycardia     Past Surgical History:  Procedure Laterality Date  . ABDOMINAL EXPOSURE N/A 06/27/2016   Procedure: ABDOMINAL EXPOSURE;  Surgeon: Chuck Hint, MD;  Location: [facility] OR;  Service: Vascular;  Laterality: N/A;  . ANTERIOR LUMBAR FUSION N/A 06/27/2016   Procedure: ANTERIOR LUMBAR FUSION L5-S1;  Surgeon: Venita Lick, MD;  Location: [facility] OR;  Service: Orthopedics;  Laterality: N/A;  Requests 3 hours  . FINGER AMPUTATION Left   . SHOULDER SURGERY      There were no vitals filed for this visit.       Subjective Assessment - 10/21/16 0807    Subjective Pt is a 56 y/o male who presents to OPPT s/p lumbar fusion on 06/27/16;  d/c'ed home for ~ 1 week then had 2 falls.  Pt's fiance came to check on pt and pt admitted to hospital 07/03/16 for L CVA resulting in R sided weakness.  Pt then had CIR and discharged 07/16/16; pt presented to outpatient Neuro for initial evaluation but was unable to complete therapy due to transportation issues.  Pt completed one month of HHPT and then began outpatient therapy for his lumbar fusion.  Pt began driving one month ago and now returns to outpatient neuro to begin therapy for residual  effects of CVA.  Pt is no longer using cane, wears AFO intermittently and has not began working yet.  Pt feels his major impairments now are walking and balance.   Pertinent History lumbar fusion 06/27/16, L CVA with R hemiparesis   Limitations Standing;Walking   How long can you stand comfortably? 2-3 hours doing yard work-but LE fatigues quickly   Patient Stated Goals improve RLE strength; reports RUE strength has improved from working out at gym   Currently in Pain? No/denies            [facility] PT Assessment - 10/21/16 7253      Assessment   Medical Diagnosis L CVA with R hemiparesis, lumbar fusion   Referring Provider Erick Colace, MD   Onset Date/Surgical Date 07/03/16   Hand Dominance Left   Prior Therapy CIR, HHPT, outpatient     Precautions   Precautions Back   Precaution Comments No formal back precautions but surgeon advised NO LEG PRESS MACHINE   Required Braces or Orthoses Other Brace/Splint   Other Brace/Splint AFO intermittently      Restrictions   Weight Bearing Restrictions No     Balance Screen   Has the patient fallen in the past 6 months Yes   How many times? 1 a week ago at [facility], tried to run to go through an automatic door and  fell, was able to return to standing without difficulty   Has the patient had a decrease in activity level because of a fear of falling?  No   Is the patient reluctant to leave their home because of a fear of falling?  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  Has to walk a very long distance from car to inside work place across parking lot   Leisure [employer]; gym 5-6 days/wk.  At gym performs treadmill, ABD/ADD, HS curls; no leg press!!     Cognition   Overall Cognitive Status Within Functional Limits for tasks assessed     Observation/Other Assessments   Focus on Therapeutic Outcomes (FOTO)  56 (44% limited; predicted 31% limited)   Other Surveys  Other Surveys   Neuro Quality of Life  LE: 40.0     Sensation   Light Touch Appears Intact     Tone   Assessment Location Right Lower Extremity     ROM / Strength   AROM / PROM / Strength Strength     Strength   Overall Strength Deficits   Overall Strength Comments LLE 5/5   Right Hip Flexion 4/5   Right Knee Flexion 4+/5   Right Knee Extension 5/5   Right Ankle Dorsiflexion 4/5     Ambulation/Gait   Ambulation/Gait Yes   Ambulation/Gait Assistance 6: Modified independent (Device/Increase time);4: Min assist   Ambulation/Gait Assistance Details provided min A to provide stabilization at L trunk and R pelvis to maintain trunk in midline with pt demonstrating improved R hip and knee flexion activation and improved step length and clearance with improved proximal stability   Ambulation Distance (Feet) 250 Feet   Assistive device None   Gait Pattern Step-through pattern;Decreased step length - right;Decreased stance time - right;Decreased hip/knee flexion - right;Right hip hike;Poor foot clearance - right;Decreased stride length;Lateral trunk lean to left   Ambulation Surface Level;Indoor   Gait velocity 2.05 ft/sec   Stairs Yes   Stairs Assistance 6: Modified independent (Device/Increase time)   Stair Management Technique Two rails;Alternating pattern;Forwards   Number of Stairs 4   Height of Stairs 6     Standardized Balance Assessment   Standardized Balance Assessment Five Times Sit to Stand;10 meter walk test   Five times sit to stand comments  14.76    10 Meter Walk 15.97 seconds or 2.05 ft/sec     Berg Balance Test   Sit to Stand Able to stand without using hands and stabilize independently   Standing Unsupported Able to stand safely 2 minutes   Sitting with Back Unsupported but Feet Supported on Floor or Stool Able to sit safely and securely 2 minutes   Stand to Sit Sits safely with minimal use of hands   Transfers Able to transfer safely, minor use of hands   Standing Unsupported with Eyes Closed Able to stand 10 seconds safely   Standing Ubsupported with Feet  Together Able to place feet together independently and stand for 1 minute with supervision   From Standing, Reach Forward with Outstretched Arm Can reach forward >5 cm safely (2")   From Standing Position, Pick up Object from Floor Able to pick up shoe, needs supervision  reports pain and stiffness in low back   From Standing Position, Turn to Look Behind Over each Shoulder Looks behind one side only/other side shows less weight shift   Turn 360 Degrees Able to turn 360 degrees safely in 4 seconds or less   Standing Unsupported, Alternately Place Feet on Step/Stool Able to stand independently and complete 8 steps >20 seconds   Standing Unsupported, One Foot in Front Able to plae foot ahead of the other independently and hold 30 seconds   Standing on One Leg Able to lift leg independently and hold 5-10 seconds   Total Score 48   Berg comment: 48/56     RLE Tone   RLE Tone Within Functional Limits    Patient demonstrates increased fall risk as noted by score of 48/56 on Berg Balance Scale.  (<36= high risk for falls, close to 100%; 37-45 significant >80%; 46-51 moderate >50%; 52-55 lower >25%)         Objective measurements completed on examination: See above findings.                  PT Education - 10/21/16 2041    Education provided Yes   Education Details clinical findings, POC, goals   Person(s) Educated Patient   Methods Explanation    Comprehension Verbalized understanding          PT Short Term Goals - 10/21/16 2101      PT SHORT TERM GOAL #1   Title TARGET DATE FOR ALL STG 11/20/16: Pt will improve functional LE strength by decreasing five times sit to stand time to < or = to 12 seconds   Baseline 14.76 seconds   Time 4   Period Weeks   Status New     PT SHORT TERM GOAL #2   Title improve BERG balance score to >/= 50/56 for improved balance    Baseline 41/56   Time 4   Period Weeks   Status New     PT SHORT TERM GOAL #3   Title improve gait velocity to > 2.25 ft/sec for improved safety during community ambulation   Baseline 2.05 ft/sec   Time 4   Period Weeks   Status New     PT SHORT TERM GOAL #4   Title Will negotiate 8 stairs with one rail with alternating sequence and will ambulate outside x 500' over uneven grass and pavement MOD I      PT SHORT TERM GOAL #5   Title Participate in FGA assessment with LTG to be set   Time 4   Period Weeks           PT Long Term Goals - 10/21/16 2104      PT LONG TERM GOAL #1   Title TARGET DATE FOR ALL LTG 12/20/16: pt will be independent with HEP and will demonstrate safe technique for performing quad, glute, hamstring, gastroc/DF strengthening at gym and safe use of treadmill (use of machines)   Time 8   Period Weeks   Status New     PT LONG TERM GOAL #2   Title improve BERG balance score to >/= 53/56 for decreased fall risk    Time 8   Period  Weeks   Status New     PT LONG TERM GOAL #3   Title improve gait velocity to >/= 2.62 ft/sec for improved community access    Time 8   Period Weeks   Status New     PT LONG TERM GOAL #4   Title amb > 1,000 feet on uneven paved, curb and grassy outdoor surfaces at MOD I for improved mobility to enter/exit work   Time 8   Period Weeks   Status New     PT LONG TERM GOAL #5   Title Pt will improve FGA to >22/30 for improved balance/safety during more dynamic gait challenges   Baseline TBD   Time 8    Period Weeks   Status New     Additional Long Term Goals   Additional Long Term Goals Yes     PT LONG TERM GOAL #6   Title Pt will demonstrate safe lifting techniques (up to 25 lb) from various height surfaces and will carry objects x 150' over level surfaces with improved body mechanics.   Time 8   Period Weeks   Status New     PT LONG TERM GOAL #7   Title Will improve FOTO Neuro QOL LE by 20 points   Baseline 40   Time 8   Period Weeks   Status New                Plan - 10/21/16 2042    Clinical Impression Statement Pt is a 56 year old male presenting to OPPT neuro for low complexity PT evaluation s/p lumbar surgery and L CVA one week later; pt has completed 1 month course of HHPT due to transportation issues and orthopedic outpatient PT to address low back. Pt's PMH significant for the following: previous shoulder injuries, multiple finger amputations, and recent lumbar fusion-anterior approach. The following deficits were noted during pt's exam: residual R hemiparesis, impaired trunk and postural control, impaired gait, balance, and increased pain with functional movement.  Pt's gait speed indicates pt is safe for limited community ambulation but pt's goal is to be independent community ambulatory in order to return to work. Pt's BERG score indicates pt is at moderate risk for falls. Pt's condition is stable and would benefit from skilled PT to address these impairments and functional limitations to maximize functional mobility independence and reduce falls risk.   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, heavy lifting, climbing; worked out at gym 5x/week; one fall in community, lumbar fusion   Clinical Presentation Stable   Clinical Presentation due to: Limited strength and balance to return to work and leisure activities, moderate falls risk, one fall in community   Clinical Decision  Making Low   Rehab Potential Good   Clinical Impairments Affecting Rehab Potential recent lumbar surgery-pain and mm tightness   PT Frequency 2x / week   PT Duration 8 weeks   PT Treatment/Interventions ADLs/Self Care Home Management;Cryotherapy;Electrical Stimulation;Moist Heat;Neuromuscular re-education;Balance training;Therapeutic exercise;Therapeutic activities;Functional mobility training;Patient/family education;Gait training;DME Instruction;Stair training;Orthotic Fit/Training;Taping;Passive range of motion   PT Next Visit Plan Assess FGA and set LTG; R NMR for hamstring activation; core activation-pt gait improves with proximal stability.  Gait/endurance outside.  How to perform gym exercises for LE safely   Consulted and Agree with Plan of Care Patient      Patient will benefit from skilled therapeutic intervention in order to improve the following deficits and  impairments:  Abnormal gait, Decreased balance, Decreased mobility, Decreased strength, Pain, Difficulty walking, Improper body mechanics, Decreased range of motion  Visit Diagnosis: Hemiplegia and hemiparesis following cerebral infarction affecting right non-dominant side (HCC)  Muscle weakness (generalized)  Other abnormalities of gait and mobility  Unsteadiness on feet  Acute midline low back pain without sciatica     Problem List Patient Active Problem List   Diagnosis Date Noted  . Abnormality of gait as late effect of stroke 08/30/2016  . Hemiparesis affecting dominant side as late effect of cerebrovascular accident (CVA) (HCC) 08/02/2016  . Disorder of lumbosacral plexus 08/02/2016  . Small vessel disease, cerebrovascular 07/05/2016  . Right sided weakness 07/03/2016  . Facial droop 07/03/2016  . Stroke (HCC) 07/03/2016  . Anemia   . Tachycardia   . Back pain 06/27/2016    Edman CircleAudra Hall, PT, DPT 10/21/16    9:15 PM    [facility] [address]  [address] [place], [place], [address] Phone: [phone]   Fax:  [phone]  Name: Darrell Watson MRN: [id number] Date of Birth: 02/09/1961

## 2016-10-22 ENCOUNTER — Telehealth: Payer: Self-pay | Admitting: Physical Therapy

## 2016-10-22 ENCOUNTER — Encounter: Payer: Self-pay | Admitting: Occupational Therapy

## 2016-10-22 ENCOUNTER — Ambulatory Visit: Payer: 59 | Admitting: Occupational Therapy

## 2016-10-22 DIAGNOSIS — R2681 Unsteadiness on feet: Secondary | ICD-10-CM

## 2016-10-22 DIAGNOSIS — I69353 Hemiplegia and hemiparesis following cerebral infarction affecting right non-dominant side: Secondary | ICD-10-CM | POA: Diagnosis not present

## 2016-10-22 NOTE — Therapy (Signed)
[facility] [address] [place], [place], [address] Phone: [phone]   Fax:  [phone]  Occupational Therapy Evaluation  Patient Details  Name: Darrell HeirOllis M Rhein MRN: [id number] Date of Birth: Mar 06, 1961 Referring Provider: Dr. Wynn BankerKirsteins  Encounter Date: 10/22/2016      OT End of Session - 10/22/16 1302    Visit Number 1   Number of Visits 1   Authorization Type UHC 60 visit limit   OT Start Time 1150   OT Stop Time 1227   OT Time Calculation (min) 37 min   Activity Tolerance Patient tolerated treatment well      Past Medical History:  Diagnosis Date  . Anemia   . Arthritis   . Tachycardia     Past Surgical History:  Procedure Laterality Date  . ABDOMINAL EXPOSURE N/A 06/27/2016   Procedure: ABDOMINAL EXPOSURE;  Surgeon: Chuck Hinthristopher S Dickson, MD;  [facility];  Service: Vascular;  Laterality: N/A;  . ANTERIOR LUMBAR FUSION N/A 06/27/2016   Procedure: ANTERIOR LUMBAR FUSION L5-S1;  Surgeon: Venita Lickahari Brooks, MD;  Location: [facility] OR;  Service: Orthopedics;  Laterality: N/A;  Requests 3 hours  . FINGER AMPUTATION Left   . SHOULDER SURGERY      There were no vitals filed for this visit.      Subjective Assessment - 10/22/16 1152    Subjective  My R arm feels back to normal   Pertinent History 06/27/16 lumbar fusion L5-S1, CVA 07/03/16   Limitations back precautions - no bending, lifting, twisting, brace on when OOB   Patient Stated Goals be able to walk normal and go back to work    Currently in Pain? No/denies  lower back hurts when I get up but stretching helps           [facility] OT Assessment - 10/22/16 0001      Assessment   Diagnosis R CVA   Referring Provider Dr. Wynn BankerKirsteins   Onset Date 07/03/16  pt had lumbar fusion on 06/28/2016   Prior Therapy inpt rehab PT, OT and then HHPT and OT     Precautions   Precautions Back  pt states he was cleared by MD to drive.    Precaution Comments BLT precautions  per pt   Required Braces or Orthoses Spinal Brace   Spinal Brace Lumbar corset  pt reports MD said to wear brace now for comfort only     Restrictions   Weight Bearing Restrictions Yes   Other Position/Activity Restrictions no lifting     Balance Screen   Has the patient fallen in the past 6 months Yes  Pt seeing PT currently   How many times? 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    Leisure lifting weights, go to the gym     ADL   Eating/Feeding Independent   Grooming Independent   Upper Body Bathing Modified independent  Lower Body Bathing Modified independent   Upper Body Dressing Independent   Lower Body Dressing Increased time;Modified independent   Toilet Transfer Modified independent   Toileting - Clothing Manipulation Independent   Toileting -  Hygiene Independent   Tub/Shower Transfer Modified independent  sometimes uses tub bench but not always     IADL   Shopping Takes care of all shopping needs independently   Light Housekeeping Maintains house alone or with occasional assistance   Meal Prep Plans, prepares and serves adequate meals independently   Community Mobility Drives own vehicle   Medication Management Is responsible for taking medication in correct dosages at correct time   Development worker, community financial matters independently (budgets, writes checks, pays rent, bills goes to bank), collects and  keeps track of income     Mobility   Mobility Status Independent     Written Expression   Dominant Hand Left     Vision - History   Baseline Vision Wears glasses only for reading     Vision Assessment   Eye Alignment Within Functional Limits     Activity Tolerance   Activity Tolerance Tolerates 30 min activity with muliple rests   Activity Tolerance Comments Pt reports tremondous energy used to ambulate     Cognition   Overall Cognitive Status Within Functional Limits for tasks assessed   Mini Mental State Exam  Pt reports initially he had memory issues but no longer feels this is an issue. Will monitor functionally     Sensation   Light Touch Appears Intact   Hot/Cold Appears Intact   Proprioception Appears Intact     Coordination   Gross Motor Movements are Fluid and Coordinated Yes   Other pt had surgery on L shoulder 10 years ago and states  he needs it on R shoulder now but he doesn't want to do it because he wasn't pleased with outcome of last surgery. States UE's are no different than before the stroke or back surgery     Tone   Assessment Location Right Upper Extremity;Left Upper Extremity     ROM / Strength   AROM / PROM / Strength AROM;Strength     AROM   Overall AROM  Within functional limits for tasks performed   Overall AROM Comments see above notes regarding shoulder limitations. Pt feels he is at baseline.      Strength   Overall Strength Unable to assess   Overall Strength Comments Pt unclear regarding lifting precautions however did state MD cleared him to return to the gym.  Due to unclear precautions did not MMT UE's - pt states he feels UE's are back to baseline but he is limited at gym for lifting due to back pain. Encouraged pt to clarify lifting and back precautions with MD. PT verbalized understanding.      Hand Function   Right Hand Gross Grasp Functional   Left Hand Gross Grasp Functional     RUE Tone   RUE Tone Within Functional Limits      LUE Tone   LUE Tone Within Functional Limits                              OT Long Term Goals - 07/18/16 1508      OT LONG TERM GOAL #1   Title Independent with coordination HEP for Rt hand (due 08/18/16)   Time 4   Period Weeks   Status New  OT LONG TERM GOAL #2   Title Pt to stand for 15 minutes w/o rest for simple meal prep demo safety   Time 4   Period Weeks   Status New     OT LONG TERM GOAL #3   Title Pt to safely perform light household tasks in standing adhering to back precautions using A/E prn    Time 4   Period Weeks   Status New     OT LONG TERM GOAL #4   Title Pt to tolerate 10 min. of UE activity w/o rest to maintain UB strength/endurance   Time 4   Period Weeks   Status New               Plan - 10/22/16 1300    Clinical Impression Statement Pt is a 56 year old male s/p back surgery (lumbar fusion) on 06/27/2016 with subsequent stroke on #/10/2016.  Pt presents with the following deficits that impact return to work:  impaired balance, LE weakness, poor activity tolerance, back pain. These defiits currently being addressed by PT. Do not feel pt requires OT at this time - pt in agreement.    Plan no f/u OT recommended at this time   Consulted and Agree with Plan of Care Patient      Patient will benefit from skilled therapeutic intervention in order to improve the following deficits and impairments:     Visit Diagnosis: Unsteadiness on feet    Problem List Patient Active Problem List   Diagnosis Date Noted  . Abnormality of gait as late effect of stroke 08/30/2016  . Hemiparesis affecting dominant side as late effect of cerebrovascular accident (CVA) (HCC) 08/02/2016  . Disorder of lumbosacral plexus 08/02/2016  . Small vessel disease, cerebrovascular 07/05/2016  . Right sided weakness 07/03/2016  . Facial droop 07/03/2016  . Stroke (HCC) 07/03/2016  . Anemia   . Tachycardia   . Back pain 06/27/2016    Norton Pastel , OTR/L 10/22/2016, 1:08 PM   [facility] [address] [place], [place], [address] Phone: [phone]   Fax:  [phone]  Name: JASHAUN PENROSE MRN: [id number] Date of Birth: February 18, 1961

## 2016-10-22 NOTE — Telephone Encounter (Signed)
Hello Dr. Shon BatonBrooks [name] (1960-08-28) was recently referred for outpatient PT and OT to continue to address impairments related to his CVA after lumbar surgery.  He is interested in returning to working out at a [employer].   He is no longer wearing the lumbar corset and he reported that back precautions have been lifted.  Is this accurate or are there any specific back precautions he should continue to follow 3-4 months out from lumbar fusion especially in regards to bending and weight restrictions with lifting?  Thank you for your guidance and assistance, Edman Circleudra Hall, PT, DPT 10/22/16    5:10 PM

## 2016-10-24 NOTE — Telephone Encounter (Signed)
Please have patient call my office to discuss in greater detail

## 2016-10-31 ENCOUNTER — Encounter: Payer: Self-pay | Admitting: Rehabilitation

## 2016-10-31 ENCOUNTER — Ambulatory Visit: Payer: 59 | Attending: Physical Medicine & Rehabilitation | Admitting: Rehabilitation

## 2016-10-31 DIAGNOSIS — I69353 Hemiplegia and hemiparesis following cerebral infarction affecting right non-dominant side: Secondary | ICD-10-CM | POA: Diagnosis present

## 2016-10-31 DIAGNOSIS — R278 Other lack of coordination: Secondary | ICD-10-CM | POA: Insufficient documentation

## 2016-10-31 DIAGNOSIS — R2689 Other abnormalities of gait and mobility: Secondary | ICD-10-CM | POA: Diagnosis present

## 2016-10-31 DIAGNOSIS — M545 Low back pain, unspecified: Secondary | ICD-10-CM

## 2016-10-31 DIAGNOSIS — R2681 Unsteadiness on feet: Secondary | ICD-10-CM

## 2016-10-31 DIAGNOSIS — M6281 Muscle weakness (generalized): Secondary | ICD-10-CM | POA: Diagnosis present

## 2016-10-31 NOTE — Therapy (Signed)
[facility] [address] [place], [place], [address] Phone: [phone]   Fax:  [phone]  Physical Therapy Treatment  Patient Details  Name: Darrell Watson MRN: [id number] Date of Birth: Sep 30, 1960 Referring Provider: Erick ColaceAndrew E Kirsteins, MD  Encounter Date: 10/31/2016      PT End of Session - 10/31/16 1237    Visit Number 2   Number of Visits 17   Date for PT Re-Evaluation 12/20/16   Authorization Type UHC-60 visit limit combined   PT Start Time 1018   PT Stop Time 1102   PT Time Calculation (min) 44 min   Activity Tolerance Patient tolerated treatment well   Behavior During Therapy [facility] for tasks assessed/performed      Past Medical History:  Diagnosis Date  . Anemia   . Arthritis   . Tachycardia     Past Surgical History:  Procedure Laterality Date  . ABDOMINAL EXPOSURE N/A 06/27/2016   Procedure: ABDOMINAL EXPOSURE;  Surgeon: Chuck Hinthristopher S Dickson, MD;  [facility];  Service: Vascular;  Laterality: N/A;  . ANTERIOR LUMBAR FUSION N/A 06/27/2016   Procedure: ANTERIOR LUMBAR FUSION L5-S1;  Surgeon: Venita Lickahari Brooks, MD;  Location: [facility] OR;  Service: Orthopedics;  Laterality: N/A;  Requests 3 hours  . FINGER AMPUTATION Left   . SHOULDER SURGERY      There were no vitals filed for this visit.      Subjective Assessment - 10/31/16 1023    Subjective Pt reports no pain, no changes since last visit, no falls.    Pertinent History lumbar fusion 06/27/16, L CVA with R hemiparesis   Limitations Standing;Walking   How long can you stand comfortably? 2-3 hours doing yard work-but LE fatigues quickly   How long can you walk comfortably? at least 10 min   Patient Stated Goals improve RLE strength; reports RUE strength has improved from working out at gym   Currently in Pain? No/denies            [facility] - 10/31/16 1026      Functional Gait  Assessment   Gait assessed  Yes   Gait Level Surface Walks  20 ft, slow speed, abnormal gait pattern, evidence for imbalance or deviates 10-15 in outside of the 12 in walkway width. Requires more than 7 sec to ambulate 20 ft.   Change in Gait Speed Makes only minor adjustments to walking speed, or accomplishes a change in speed with significant gait deviations, deviates 10-15 in outside the 12 in walkway width, or changes speed but loses balance but is able to recover and continue walking.   Gait with Horizontal Head Turns Performs head turns smoothly with slight change in gait velocity (eg, minor disruption to smooth gait path), deviates 6-10 in outside 12 in walkway width, or uses an assistive device.   Gait with Vertical Head Turns Performs task with slight change in gait velocity (eg, minor disruption to smooth gait path), deviates 6 - 10 in outside 12 in walkway width or uses assistive device   Gait and Pivot Turn Pivot turns safely in greater than 3 sec and stops with no loss of balance, or pivot turns safely within 3 sec and stops with mild imbalance, requires small steps to catch balance.   Step Over Obstacle Is able to step over one shoe box (4.5 in total height) but must slow down and adjust steps to clear box safely. May require verbal cueing.   Gait with Narrow Base  of Support Is able to ambulate for 10 steps heel to toe with no staggering.   Gait with Eyes Closed Cannot walk 20 ft without assistance, severe gait deviations or imbalance, deviates greater than 15 in outside 12 in walkway width or will not attempt task.   Ambulating Backwards Walks 20 ft, uses assistive device, slower speed, mild gait deviations, deviates 6-10 in outside 12 in walkway width.   Steps Alternating feet, must use rail.   Total Score 16   FGA comment: < 19 = high risk fall                     [facility] Adult PT Treatment/Exercise - 10/31/16 0001      Neuro Re-ed    Neuro Re-ed Details  Worked on NMR for improved R proximal stabilization (glute med activation),  core activation and improved R hamstring activation as well as to decrease tone for more fluid gait pattern.  Began with having pt sit on rolling stool and propel forward x 3 bouts of 15-20'.  Cues for improved alignment and control.  Then attempted to work in tall kneeling for R hip activation, however pt attempted tall to half kneeling which caused L hamstring cramp, as did holding L leg out to side and returning to midline x 5 reps.  Then had pt step LLE out to side and back.  Eventually discontinued task due to increased hamstring cramping.  Performed quadruped alternating UE/LE x 10 reps with light tactile cues at core for stability.  Had pt in tall kneeling with forearms on physioball rolling forward/back x 10 reps for improved core activation.  Note that gait folllowing  NMR tasks did appear more fluid than when pt ambulated into clinic.                  PT Education - 10/31/16 1237    Education provided Yes   Education Details education on tone in RLE   Person(s) Educated Patient   Methods Explanation   Comprehension Verbalized understanding          PT Short Term Goals - 10/31/16 1239      PT SHORT TERM GOAL #1   Title TARGET DATE FOR ALL STG 11/20/16: Pt will improve functional LE strength by decreasing five times sit to stand time to < or = to 12 seconds   Baseline 14.76 seconds   Time 4   Period Weeks   Status New     PT SHORT TERM GOAL #2   Title improve BERG balance score to >/= 50/56 for improved balance    Baseline 41/56   Time 4   Period Weeks   Status New     PT SHORT TERM GOAL #3   Title improve gait velocity to > 2.25 ft/sec for improved safety during community ambulation   Baseline 2.05 ft/sec   Time 4   Period Weeks   Status New     PT SHORT TERM GOAL #4   Title Will negotiate 8 stairs with one rail with alternating sequence and will ambulate outside x 500' over uneven grass and pavement MOD I      PT SHORT TERM GOAL #5   Title Participate in FGA  assessment with LTG to be set   Baseline 16/30 on 10/31/16   Time 4   Period Weeks   Status Achieved           PT Long Term Goals - 10/31/16 1239  PT LONG TERM GOAL #1   Title TARGET DATE FOR ALL LTG 12/20/16: pt will be independent with HEP and will demonstrate safe technique for performing quad, glute, hamstring, gastroc/DF strengthening at gym and safe use of treadmill (use of machines)   Time 8   Period Weeks   Status New     PT LONG TERM GOAL #2   Title improve BERG balance score to >/= 53/56 for decreased fall risk    Time 8   Period Weeks   Status New     PT LONG TERM GOAL #3   Title improve gait velocity to >/= 2.62 ft/sec for improved community access    Time 8   Period Weeks   Status New     PT LONG TERM GOAL #4   Title amb > 1,000 feet on uneven paved, curb and grassy outdoor surfaces at MOD I for improved mobility to enter/exit work   Time 8   Period Weeks   Status New     PT LONG TERM GOAL #5   Title Pt will improve FGA to >22/30 for improved balance/safety during more dynamic gait challenges   Baseline TBD   Time 8   Period Weeks   Status New     Additional Long Term Goals   Additional Long Term Goals Yes     PT LONG TERM GOAL #6   Title Pt will demonstrate safe lifting techniques (up to 25 lb) from various height surfaces and will carry objects x 150' over level surfaces with improved body mechanics.   Time 8   Period Weeks   Status New     PT LONG TERM GOAL #7   Title Will improve FOTO Neuro QOL LE by 20 points   Baseline 40   Time 8   Period Weeks   Status New     PT LONG TERM GOAL #8   Title Pt will improve FGA to 20/23 in order to indicate decreased fall risk.     Baseline 16/30 at baseline   Time 8   Period Weeks   Status New               Plan - 10/31/16 1237    Clinical Impression Statement Skilled session focused on assessment of dynamic balance/gait with FGA.  Note score of 16/30 placing pt at high fall risk.  Also  began NMR for decreased tone, more fluid gait, and improved RLE activation/stabilization.  Pt tolerated all very well except for tall kneeling due to hamstring cramping likely due to previous exercises.    Rehab Potential Good   Clinical Impairments Affecting Rehab Potential recent lumbar surgery-pain and mm tightness   PT Frequency 2x / week   PT Duration 8 weeks   PT Treatment/Interventions ADLs/Self Care Home Management;Cryotherapy;Electrical Stimulation;Moist Heat;Neuromuscular re-education;Balance training;Therapeutic exercise;Therapeutic activities;Functional mobility training;Patient/family education;Gait training;DME Instruction;Stair training;Orthotic Fit/Training;Taping;Passive range of motion   PT Next Visit Plan R NMR for hamstring activation; core activation-pt gait improves with proximal stability.  Gait/endurance outside.  How to perform gym exercises for LE safely   Consulted and Agree with Plan of Care Patient      Patient will benefit from skilled therapeutic intervention in order to improve the following deficits and impairments:  Abnormal gait, Decreased balance, Decreased mobility, Decreased strength, Pain, Difficulty walking, Improper body mechanics, Decreased range of motion  Visit Diagnosis: Unsteadiness on feet  Hemiplegia and hemiparesis following cerebral infarction affecting right non-dominant side (HCC)  Muscle weakness (generalized)  Other  abnormalities of gait and mobility  Acute midline low back pain without sciatica     Problem List Patient Active Problem List   Diagnosis Date Noted  . Abnormality of gait as late effect of stroke 08/30/2016  . Hemiparesis affecting dominant side as late effect of cerebrovascular accident (CVA) (HCC) 08/02/2016  . Disorder of lumbosacral plexus 08/02/2016  . Small vessel disease, cerebrovascular 07/05/2016  . Right sided weakness 07/03/2016  . Facial droop 07/03/2016  . Stroke (HCC) 07/03/2016  . Anemia   .  Tachycardia   . Back pain 06/27/2016    Harriet Butte, PT, MPT [facility] [address] [place], [place], [address] Phone: [phone]   Fax:  [phone] 10/31/16, 12:42 PM  Name: Darrell Watson MRN: [id number] Date of Birth: February 28, 1961

## 2016-11-04 ENCOUNTER — Encounter: Payer: Self-pay | Admitting: Occupational Therapy

## 2016-11-04 ENCOUNTER — Encounter (INDEPENDENT_AMBULATORY_CARE_PROVIDER_SITE_OTHER): Payer: Self-pay

## 2016-11-04 ENCOUNTER — Encounter: Payer: Self-pay | Admitting: Neurology

## 2016-11-04 ENCOUNTER — Ambulatory Visit (INDEPENDENT_AMBULATORY_CARE_PROVIDER_SITE_OTHER): Payer: 59 | Admitting: Neurology

## 2016-11-04 VITALS — BP 154/98 | HR 95 | Ht 70.0 in | Wt 194.0 lb

## 2016-11-04 DIAGNOSIS — R0683 Snoring: Secondary | ICD-10-CM

## 2016-11-04 DIAGNOSIS — I63511 Cerebral infarction due to unspecified occlusion or stenosis of right middle cerebral artery: Secondary | ICD-10-CM

## 2016-11-04 DIAGNOSIS — R351 Nocturia: Secondary | ICD-10-CM

## 2016-11-04 DIAGNOSIS — Z8673 Personal history of transient ischemic attack (TIA), and cerebral infarction without residual deficits: Secondary | ICD-10-CM | POA: Diagnosis not present

## 2016-11-04 DIAGNOSIS — R531 Weakness: Secondary | ICD-10-CM | POA: Diagnosis not present

## 2016-11-04 NOTE — Patient Instructions (Signed)

## 2016-11-04 NOTE — Progress Notes (Signed)
Subjective:    Patient ID: Darrell Watson is a 56 y.o. male.  HPI     Darrell FoleySaima Mardy Lucier, MD, PhD [facility] [address], [address] [address] [place], [place] [address]  Dear Satira SarkVikram,   I saw your patient, Darrell Watson, upon your kind request in my clinic today for initial consultation of his sleep disorder, in particular, concern for underlying obstructive sleep apnea. The patient is unaccompanied today. 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 I reviewed your office note from 08/20/2016. His Epworth sleepiness score is 6 out of 24, fatigue score is 32 out of 63. He lives with his wife. He works for [employer]. He has 2 children. He is a nonsmoker and drinks alcohol about twice a week and caffeine in the form of coffee, 2 servings per day. He tries to be in bed around 10 PM and wakeup time is 7:30. He has nocturia once per average night, denies morning headaches or telltale restless leg symptoms but does have discomfort in his lower back and sometimes has to sit up in bed. He had a tonsillectomy as a child. He has a first cousin with sleep apnea. He is reluctant to come in for sleep study as he feels he will not sleep well and he does not foresee that he could tolerate a CPAP but is willing to try it if the need arises. He has lost a little bit of weight. He has reduced his testosterone injections to once every 2 weeks and would like to go down even further. This is prescribed through his primary care physician. He finished a 30 day heart monitor last month but has not received results. We reviewed his results,  interpreted by Dr. Johney Watson. He was advised that there was overall sinus rhythm and some tachycardia, could not exclude other forms of tachycardia other than sinus tachycardia. He was advised that there was no clear-cut episodes of A. fib which is reassuring. He does report having a faster resting heart rate, sometimes in the low 100s even and this has been going on for the past several years, maybe more than 5 years even. He has PT, feels weaker on the right still, particularly RLE.   His Past Medical History Is Significant For: Past Medical History:  Diagnosis Date  . Anemia   . Arthritis   . Tachycardia     His Past Surgical History Is Significant For: Past Surgical History:  Procedure Laterality Date  . ABDOMINAL EXPOSURE N/A 06/27/2016   Procedure: ABDOMINAL EXPOSURE;  Surgeon: Chuck Hinthristopher S Dickson, MD;  [facility];  Service: Vascular;  Laterality: N/A;  . ANTERIOR LUMBAR FUSION N/A 06/27/2016   Procedure: ANTERIOR LUMBAR FUSION L5-S1;  Surgeon: Venita Lickahari Brooks, MD;  Location: [facility] OR;  Service: Orthopedics;  Laterality: N/A;  Requests 3 hours  . FINGER AMPUTATION Left   . SHOULDER SURGERY      His Family History Is Significant For: No family history on file.  His Social History Is Significant For: Social History   Social History  . Marital status: Married    Spouse name: N/A  . Number of children: N/A  . Years of education: N/A  Social History Main Topics  . Smoking status: Never Smoker  . Smokeless tobacco: Never Used  . Alcohol use Yes     Comment: BEER  OCC  . Drug use: No  . Sexual activity: Not Asked   Other Topics Concern  . None   Social History Narrative  . None    His Allergies Are:  No Known Allergies:   His Current Medications Are:  Outpatient Encounter Prescriptions as of 11/04/2016  Medication Sig  . acetaminophen (TYLENOL) 325 MG tablet Take 650 mg by mouth every 6 (six) hours as needed (for pain.).  Marland Kitchen aspirin EC 81 MG EC tablet Take 1 tablet (81  mg total) by mouth daily.  Marland Kitchen testosterone cypionate (DEPOTESTOSTERONE CYPIONATE) 200 MG/ML injection Inject 300 mg into the muscle every 14 (fourteen) days. Gets 1.5 ml at Dr office every 2 weeks Next dose is due 06-24-2016   No facility-administered encounter medications on file as of 11/04/2016.   :  Review of Systems:  Out of a complete 14 point review of systems, all are reviewed and negative with the exception of these symptoms as listed below: Review of Systems  Neurological:       Pt presents today to discuss his sleep. Pt has suffered a stroke. Pt has never had a sleep study but does endorse snoring.  Epworth Sleepiness Scale 0= would never doze 1= slight chance of dozing 2= moderate chance of dozing 3= high chance of dozing  Sitting and reading: 2 Watching TV: 1 Sitting inactive in a public place (ex. Theater or meeting): 0 As a passenger in a car for an hour without a break: 0 Lying down to rest in the afternoon: 2 Sitting and talking to someone: 0 Sitting quietly after lunch (no alcohol): 1 In a car, while stopped in traffic: 0 Total: 6     Objective:  Neurological Exam  Physical Exam Physical Examination:   Vitals:   11/04/16 1316  BP: (!) 154/98  Pulse: 95    General Examination: The patient is a very pleasant 56 y.o. male in no acute distress. He appears well-developed and well-nourished and well groomed.   HEENT: Normocephalic, atraumatic, pupils are equal, round and reactive to light and accommodation. Extraocular tracking is good without limitation to gaze excursion or nystagmus noted. Normal smooth pursuit is noted. Hearing is grossly intact. Face is symmetric with normal facial animation and normal facial sensation. Speech is clear with no dysarthria noted. There is no hypophonia. There is no lip, neck/head, jaw or voice tremor. Neck is supple with full range of passive and active motion. There are no carotid bruits on auscultation. Oropharynx exam  reveals: no significant mouth dryness, good dental hygiene and moderate airway crowding, due to floppy soft palate, wider uvula, ends in a wisp like ending. Mallampati is class II. Tongue protrudes centrally and palate elevates symmetrically. Tonsils are absent with possible residual tissue on the L. Neck size is 16.5 inches. He has a nearly absent overbite.   Chest: Clear to auscultation without wheezing, rhonchi or crackles noted.  Heart: S1+S2+0, regular and normal without murmurs, rubs or gallops noted.   Abdomen: Soft, non-tender and non-distended with normal bowel sounds appreciated on auscultation.  Extremities: There is no pitting edema in the distal lower extremities bilaterally. Pedal pulses are intact.  Skin: Warm and dry without trophic changes noted.  Musculoskeletal: exam reveals no obvious joint deformities, tenderness or joint swelling or erythema, missing distal fingers 3-5 on L.   Neurologically:  Mental status: The patient is awake, alert and oriented in all 4 spheres. His immediate and remote memory, attention, language skills and fund of knowledge are appropriate. There is no evidence of aphasia, agnosia, apraxia or anomia. Speech is clear with normal prosody and enunciation. Thought process is linear. Mood is normal and affect is normal.  Cranial nerves II - XII are as described above under HEENT exam. In addition: shoulder shrug is normal with equal shoulder height noted. Motor exam: Normal bulk, strength and tone is noted except mild R hip flexor weakness. There is no drift, tremor or rebound. Romberg is negative. Reflexes are 2+ throughout, L knee 3+. Fine motor skills and coordination: intact with normal finger taps, normal hand movements, normal rapid alternating patting, normal foot taps and normal foot agility.  Cerebellar testing: No dysmetria or intention tremor on finger to nose testing. Heel to shin is difficult R more than L. There is no truncal or gait ataxia.   Sensory exam: intact to light touch in the upper and lower extremities.  Gait, station and balance: He stands easily. No veering to one side is noted. No leaning to one side is noted. Posture is age-appropriate and stance is narrow based. Gait shows normal stride length and normal pace. No problems turning are noted. Tandem walk is difficult for him. Able to briefly pull up to toe stance.      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 I had a long chat with the patient about my findings and the diagnosis of OSA, its prognosis and treatment options. We talked about medical treatments, surgical interventions and non-pharmacological approaches. I explained in particular the risks and ramifications of untreated moderate to severe OSA, especially with respect to developing cardiovascular disease down the Road, including congestive heart failure, difficult to treat hypertension, cardiac arrhythmias, or stroke. Even type 2 diabetes has, in part, been linked to untreated OSA. Symptoms of untreated OSA include daytime sleepiness, memory problems, mood irritability and mood disorder such as depression and anxiety, lack of energy, as well as recurrent headaches, especially morning headaches. We talked about trying to maintain a healthy lifestyle in general, as well as the importance of weight control. I encouraged the patient to eat healthy, exercise daily and keep well hydrated, to keep a scheduled bedtime and wake time routine, to not skip any meals and eat healthy snacks in between meals. I  advised the patient not to drive when feeling sleepy. I recommended the following at this time: sleep study with potential positive airway pressure titration. (We will score hypopneas at 4%).   I explained the sleep test procedure to the patient and also outlined possible surgical and non-surgical treatment options of OSA, including the use of a custom-made dental device (which would require a referral to a specialist dentist or oral surgeon), upper airway surgical options, such as pillar implants, radiofrequency surgery, tongue base surgery, and UPPP (which would involve a referral to an ENT surgeon). Rarely, jaw surgery such as mandibular advancement may be considered.  I also explained the CPAP treatment option to the patient, who indicated that he  would be willing to try CPAP if the need arises. I explained the importance of being compliant with PAP treatment, not only for insurance purposes but primarily to improve His symptoms, and for the patient's long term health benefit, including to reduce His cardiovascular risks. I answered all his questions today and the patient was in agreement. I would like to see him back after the sleep study is completed and encouraged him to call with any interim questions, concerns, problems or updates.   Thank you very much for allowing me to participate in the care of this nice patient. If I can be of any further assistance to you please do not hesitate to talk to me.  Sincerely,   Darrell Foley, MD, PhD

## 2016-11-06 ENCOUNTER — Ambulatory Visit: Payer: 59 | Admitting: Physical Therapy

## 2016-11-06 ENCOUNTER — Encounter: Payer: Self-pay | Admitting: Physical Therapy

## 2016-11-06 ENCOUNTER — Encounter: Payer: Self-pay | Admitting: Occupational Therapy

## 2016-11-06 DIAGNOSIS — M6281 Muscle weakness (generalized): Secondary | ICD-10-CM

## 2016-11-06 DIAGNOSIS — R2681 Unsteadiness on feet: Secondary | ICD-10-CM | POA: Diagnosis not present

## 2016-11-06 DIAGNOSIS — R2689 Other abnormalities of gait and mobility: Secondary | ICD-10-CM

## 2016-11-06 NOTE — Therapy (Signed)
[facility] [address] [place], [place], [address] Phone: [phone]   Fax:  [phone]  Physical Therapy Treatment  Patient Details  Name: Darrell Watson MRN: [id number] Date of Birth: [birth year] Referring Provider: Erick Colace, MD  Encounter Date: 11/06/2016      PT End of Session - 11/06/16 1400    Visit Number 3   Number of Visits 17   Date for PT Re-Evaluation 12/20/16   Authorization Type UHC-60 visit limit combined   PT Start Time 1018   PT Stop Time 1102   PT Time Calculation (min) 44 min   Activity Tolerance Patient tolerated treatment well   Behavior During Therapy [facility] for tasks assessed/performed      Past Medical History:  Diagnosis Date  . Anemia   . Arthritis   . Tachycardia     Past Surgical History:  Procedure Laterality Date  . ABDOMINAL EXPOSURE N/A 06/27/2016   Procedure: ABDOMINAL EXPOSURE;  Surgeon: Chuck Hint, MD;  Location: [facility] OR;  Service: Vascular;  Laterality: N/A;  . ANTERIOR LUMBAR FUSION N/A 06/27/2016   Procedure: ANTERIOR LUMBAR FUSION L5-S1;  Surgeon: Venita Lick, MD;  Location: [facility] OR;  Service: Orthopedics;  Laterality: N/A;  Requests 3 hours  . FINGER AMPUTATION Left   . SHOULDER SURGERY      There were no vitals filed for this visit.      Subjective Assessment - 11/06/16 1019    Subjective Patient denies any changes or falls. Patient reports he has returned to routinely working out a the gym with emphasis on upper body exercises. He is avoiding utilizing the leg press due to the physician's request.    Pertinent History lumbar fusion 06/27/16, L CVA with R hemiparesis   Limitations Standing;Walking   How long can you stand comfortably? 2-3 hours doing yard work-but LE fatigues quickly   How long can you walk comfortably? at least 10 min   Patient Stated Goals improve RLE strength; reports RUE strength has improved from working out at gym   Currently  in Pain? Yes   Pain Score 2   woke up with back pain   Pain Location Back   Pain Orientation Lower;Mid  L5/S1                          [facility] Adult PT Treatment/Exercise - 11/06/16 1015      Ambulation/Gait   Ambulation/Gait Yes   Ambulation/Gait Assistance 6: Modified independent (Device/Increase time)   Ambulation Distance (Feet) 115 Feet  166ft x 2    Assistive device None   Gait Pattern Step-through pattern;Decreased step length - right;Decreased stance time - right;Decreased hip/knee flexion - right;Right hip hike;Poor foot clearance - right;Decreased stride length;Lateral trunk lean to left   Ambulation Surface Level;Indoor   Stairs Yes   Stairs Assistance 6: Modified independent (Device/Increase time)   Stair Management Technique One rail Right;Alternating pattern;Forwards   Number of Stairs 4  x3 reps     Neuro Re-ed    Neuro Re-ed Details  Performed NMR with bridging with R foot off edge of bed (R knee at 90* angle utilizing a block under patient's R foot). Started with patient's L leg in hooklying position. Progressed to LLE resting in near 90/90 position propped on stool to foster incresaed WB and workload on RLE during bridge. Patient sat on rolling stool and rolled fwd utilizing one LE at a time to foster  hamstring activation. Required intermittent tactile cueing for upright posture and stabilization when seated on stool.      Exercises   Exercises --                PT Education - 11/06/16 1020    Education provided Yes   Education Details HEP core stabilization (posterior pelvic tilt) with LE movement (hooklying abd/add, heel slides, hip flexion, RLE off/on low mat)   Person(s) Educated Patient   Methods Explanation;Demonstration;Tactile cues;Verbal cues;Handout   Comprehension Verbalized understanding;Returned demonstration;Verbal cues required;Tactile cues required;Need further instruction          PT Short Term Goals - 11/06/16 1923       PT SHORT TERM GOAL #1   Title TARGET DATE FOR ALL STG 11/20/16: Pt will improve functional LE strength by decreasing five times sit to stand time to < or = to 12 seconds   Baseline 14.76 seconds   Time 4   Period Weeks   Status On-going     PT SHORT TERM GOAL #2   Title improve BERG balance score to >/= 50/56 for improved balance    Baseline 41/56   Time 4   Period Weeks   Status On-going     PT SHORT TERM GOAL #3   Title improve gait velocity to > 2.25 ft/sec for improved safety during community ambulation   Baseline 2.05 ft/sec   Time 4   Period Weeks   Status On-going     PT SHORT TERM GOAL #4   Title Will negotiate 8 stairs with one rail with alternating sequence and will ambulate outside x 500' over uneven grass and pavement MOD I    Status On-going     PT SHORT TERM GOAL #5   Title Participate in FGA assessment with LTG to be set   Baseline 16/30 on 10/31/16   Time 4   Period Weeks   Status Achieved           PT Long Term Goals - 11/06/16 1924      PT LONG TERM GOAL #1   Title TARGET DATE FOR ALL LTG 12/20/16: pt will be independent with HEP and will demonstrate safe technique for performing quad, glute, hamstring, gastroc/DF strengthening at gym and safe use of treadmill (use of machines)   Time 8   Period Weeks   Status On-going     PT LONG TERM GOAL #2   Title improve BERG balance score to >/= 53/56 for decreased fall risk    Time 8   Period Weeks   Status On-going     PT LONG TERM GOAL #3   Title improve gait velocity to >/= 2.62 ft/sec for improved community access    Time 8   Period Weeks   Status On-going     PT LONG TERM GOAL #4   Title amb > 1,000 feet on uneven paved, curb and grassy outdoor surfaces at MOD I for improved mobility to enter/exit work   Time 8   Period Weeks   Status On-going     PT LONG TERM GOAL #5   Title Pt will improve FGA to >22/30 for improved balance/safety during more dynamic gait challenges   Baseline TBD    Time 8   Period Weeks   Status On-going     PT LONG TERM GOAL #6   Title Pt will demonstrate safe lifting techniques (up to 25 lb) from various height surfaces and will carry objects x 150' over  level surfaces with improved body mechanics.   Time 8   Period Weeks   Status On-going     PT LONG TERM GOAL #7   Title Will improve FOTO Neuro QOL LE by 20 points   Baseline 40   Time 8   Period Weeks   Status On-going     PT LONG TERM GOAL #8   Title Pt will improve FGA to 20/23 in order to indicate decreased fall risk.     Baseline 16/30 at baseline   Time 8   Period Weeks   Status On-going               Plan - 11/06/16 1403    Clinical Impression Statement Today's skilled PT session focused on advancing patient's NMR with emphasis on R hamstring and core activation. PT initiated HEP to aid in decreasing back pain via core activation. Patient is making progress towards goals, and will benefit from continued skilled PT to address functional mobility deficits.    Rehab Potential Good   Clinical Impairments Affecting Rehab Potential recent lumbar surgery-pain and mm tightness   PT Frequency 2x / week   PT Duration 8 weeks   PT Treatment/Interventions ADLs/Self Care Home Management;Cryotherapy;Electrical Stimulation;Moist Heat;Neuromuscular re-education;Balance training;Therapeutic exercise;Therapeutic activities;Functional mobility training;Patient/family education;Gait training;DME Instruction;Stair training;Orthotic Fit/Training;Taping;Passive range of motion   PT Next Visit Plan progress NMR for hamstring and core activation with emphasis on R weight shfit; gait/endurance outside; check on how patient is performing gym exercises to ensure safety    Consulted and Agree with Plan of Care Patient      Patient will benefit from skilled therapeutic intervention in order to improve the following deficits and impairments:  Abnormal gait, Decreased balance, Decreased mobility,  Decreased strength, Pain, Difficulty walking, Improper body mechanics, Decreased range of motion  Visit Diagnosis: Unsteadiness on feet  Muscle weakness (generalized)  Other abnormalities of gait and mobility     Problem List Patient Active Problem List   Diagnosis Date Noted  . Abnormality of gait as late effect of stroke 08/30/2016  . Hemiparesis affecting dominant side as late effect of cerebrovascular accident (CVA) (HCC) 08/02/2016  . Disorder of lumbosacral plexus 08/02/2016  . Small vessel disease, cerebrovascular 07/05/2016  . Right sided weakness 07/03/2016  . Facial droop 07/03/2016  . Stroke (HCC) 07/03/2016  . Anemia   . Tachycardia   . Back pain 06/27/2016   Chase Picket, SPT 11/06/2016, 2:04 PM  Vladimir Faster, PT, DPT  11/06/2016, 7:25 PM   [facility] [address] [place], [place], [address] Phone: [phone]   Fax:  [phone]  Name: Darrell Watson MRN: [id number] Date of Birth: Feb 03, 1961

## 2016-11-06 NOTE — Patient Instructions (Signed)
PELVIC TILT: Posterior    Tighten abdominals, flatten low back.   Copyright  VHI. All rights reserved.   (1) Hip Abduction / Adduction: with Knee Flexion (Supine)    With both knees bent, gently lower both knees towards the bed while keeping your core engaged (the "pelvic tilt" in the first picture in this packet).  Repeat __8__ times per set. Do __1__ sets per session. Do __1__ sessions per day.  http://orth.exer.us/682   Copyright  VHI. All rights reserved.    (2) Heel Slide    Bend knee and pull heel toward buttocks. Return. Repeat with other knee. Complete this exercise AFTER you have activated your core ("pelvic tilt" as in the first picture in this packet).  Repeat __8__ times per leg. Do __1__ sessions per day.  http://gt2.exer.us/371   Copyright  VHI. All rights reserved.   (3) Knee to Chest: Advanced    Lie with both legs bent. Bring one knee up. You do not have to lift your knee all the way to your chest. Lift knee __8_ times. Restabilize pelvis. Repeat with other leg. Do _1__ set, _1__ times per day. Complete this activity AFTER you have activated your core ("pelvic tilt" like the first picture in this packet).   http://ss.exer.us/10   Copyright  VHI. All rights reserved.   (4) From same starting position as exercises above, after activating your core ("pelvic tilt" like in the first picture in this packet), then gently lower your R leg off the edge of the bed. Then return your R leg up on the bed - ensure your core is engaged throughout the entire exercise.   (5) Bridging    Slowly raise buttocks from floor, keeping stomach tight. (Even though this picture does not show it) Perform this exercise with your RLE off the edge of the couch/bed. Keep your core activated as you push through your R foot (on the floor off the edge of the mat/bed). Push through your R HEEL.  Repeat __8__ times per set. Do __1-2__ sets per session. Do _1___ sessions per  day.  http://orth.exer.us/1096   Copyright  VHI. All rights reserved.

## 2016-11-08 ENCOUNTER — Ambulatory Visit: Payer: 59 | Admitting: Physical Therapy

## 2016-11-08 ENCOUNTER — Encounter: Payer: Self-pay | Admitting: Physical Therapy

## 2016-11-08 DIAGNOSIS — I69353 Hemiplegia and hemiparesis following cerebral infarction affecting right non-dominant side: Secondary | ICD-10-CM

## 2016-11-08 DIAGNOSIS — R2681 Unsteadiness on feet: Secondary | ICD-10-CM

## 2016-11-08 DIAGNOSIS — R2689 Other abnormalities of gait and mobility: Secondary | ICD-10-CM

## 2016-11-08 DIAGNOSIS — R278 Other lack of coordination: Secondary | ICD-10-CM

## 2016-11-08 DIAGNOSIS — M6281 Muscle weakness (generalized): Secondary | ICD-10-CM

## 2016-11-08 NOTE — Therapy (Signed)
[facility] [address] [place], [place], [address] Phone: [phone]   Fax:  [phone]  Physical Therapy Treatment  Patient Details  Name: Darrell Watson MRN: [id number] Date of Birth: [birth year] Referring Provider: Erick Colace, MD  Encounter Date: 11/08/2016      PT End of Session - 11/08/16 1234    Visit Number 4   Number of Visits 17   Date for PT Re-Evaluation 12/20/16   Authorization Type UHC-60 visit limit combined   PT Start Time 1100   PT Stop Time 1150   PT Time Calculation (min) 50 min   Activity Tolerance Patient tolerated treatment well   Behavior During Therapy [facility] for tasks assessed/performed      Past Medical History:  Diagnosis Date  . Anemia   . Arthritis   . Tachycardia     Past Surgical History:  Procedure Laterality Date  . ABDOMINAL EXPOSURE N/A 06/27/2016   Procedure: ABDOMINAL EXPOSURE;  Surgeon: Chuck Hint, MD;  Location: [facility] OR;  Service: Vascular;  Laterality: N/A;  . ANTERIOR LUMBAR FUSION N/A 06/27/2016   Procedure: ANTERIOR LUMBAR FUSION L5-S1;  Surgeon: Venita Lick, MD;  Location: [facility] OR;  Service: Orthopedics;  Laterality: N/A;  Requests 3 hours  . FINGER AMPUTATION Left   . SHOULDER SURGERY      There were no vitals filed for this visit.      Subjective Assessment - 11/08/16 1115    Subjective Patinet denies any changes or falls; states in AM his back pain is 2/10 but resolves once he gets up and moving around;  he is frustrated with staying home; wants to get back to work; he has been working on [employer];  he clarified not using leg press; his back MD doens't want him flexing the back and putting a lot of pressure through his back ; back pain seems worse lately ; stated after fusion he has no pain; but this month it's not real bad but he can feel it;  never more that a 1 or 2    Pertinent History lumbar fusion 06/27/16, L CVA with R hemiparesis   Currently in  Pain? Yes   Pain Score 2    Pain Location Back   Pain Orientation Right;Lower   Pain Descriptors / Indicators Aching      NMR Went through HEP ; he was able to do all the ex's well; educated on TA contraction with all ex's and avoiding val salva; advanced the HEP with  Supine bike and lower ab ex's ; and standing hip/glut ex's  Treadmill; progressed  Up to 2 mph ; VQ's throughout for improved gait control; had him up to this speed to let him feel a normal gait speed ; using bar to hold to for support; used his phone and took a 20 sec video for him for visual feedback; to correct gait deficits;  We did 2  5 min bouts then 1 1 min for the video; used verbal que to limit lateral head mvt;- this smoothed out a lot of the abnormality ;                      [facility] Adult PT Treatment/Exercise - 11/08/16 0001      Ambulation/Gait   Ambulation/Gait Yes   Ambulation/Gait Assistance 6: Modified independent (Device/Increase time)                PT Education - 11/08/16 1234  Education provided Yes   Education Details HEP  provided 3 addtional handouts/exs;  reviewed core control with established exs    Person(s) Educated Patient   Methods Explanation;Tactile cues;Demonstration;Verbal cues;Handout   Comprehension Verbalized understanding;Returned demonstration          PT Short Term Goals - 11/06/16 1923      PT SHORT TERM GOAL #1   Title TARGET DATE FOR ALL STG 11/20/16: Pt will improve functional LE strength by decreasing five times sit to stand time to < or = to 12 seconds   Baseline 14.76 seconds   Time 4   Period Weeks   Status On-going     PT SHORT TERM GOAL #2   Title improve BERG balance score to >/= 50/56 for improved balance    Baseline 41/56   Time 4   Period Weeks   Status On-going     PT SHORT TERM GOAL #3   Title improve gait velocity to > 2.25 ft/sec for improved safety during community ambulation   Baseline 2.05 ft/sec   Time 4   Period  Weeks   Status On-going     PT SHORT TERM GOAL #4   Title Will negotiate 8 stairs with one rail with alternating sequence and will ambulate outside x 500' over uneven grass and pavement MOD I    Status On-going     PT SHORT TERM GOAL #5   Title Participate in FGA assessment with LTG to be set   Baseline 16/30 on 10/31/16   Time 4   Period Weeks   Status Achieved           PT Long Term Goals - 11/06/16 1924      PT LONG TERM GOAL #1   Title TARGET DATE FOR ALL LTG 12/20/16: pt will be independent with HEP and will demonstrate safe technique for performing quad, glute, hamstring, gastroc/DF strengthening at gym and safe use of treadmill (use of machines)   Time 8   Period Weeks   Status On-going     PT LONG TERM GOAL #2   Title improve BERG balance score to >/= 53/56 for decreased fall risk    Time 8   Period Weeks   Status On-going     PT LONG TERM GOAL #3   Title improve gait velocity to >/= 2.62 ft/sec for improved community access    Time 8   Period Weeks   Status On-going     PT LONG TERM GOAL #4   Title amb > 1,000 feet on uneven paved, curb and grassy outdoor surfaces at MOD I for improved mobility to enter/exit work   Time 8   Period Weeks   Status On-going     PT LONG TERM GOAL #5   Title Pt will improve FGA to >22/30 for improved balance/safety during more dynamic gait challenges   Baseline TBD   Time 8   Period Weeks   Status On-going     PT LONG TERM GOAL #6   Title Pt will demonstrate safe lifting techniques (up to 25 lb) from various height surfaces and will carry objects x 150' over level surfaces with improved body mechanics.   Time 8   Period Weeks   Status On-going     PT LONG TERM GOAL #7   Title Will improve FOTO Neuro QOL LE by 20 points   Baseline 40   Time 8   Period Weeks   Status On-going     PT  LONG TERM GOAL #8   Title Pt will improve FGA to 20/23 in order to indicate decreased fall risk.     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 Abnormality of gait as late effect of stroke 08/30/2016  . Hemiparesis affecting dominant side as late effect of cerebrovascular accident (CVA) (HCC) 08/02/2016  . Disorder of lumbosacral plexus 08/02/2016  . Small vessel disease, cerebrovascular 07/05/2016  . Right sided weakness 07/03/2016  . Facial droop 07/03/2016  . Stroke (HCC) 07/03/2016  . Anemia   . Tachycardia   . Back pain 06/27/2016    Vashti HeyShoup, Jeffrey D PT DPT  11/08/2016, 3:01 PM  [facility] [address] [place], [place], [address] Phone: [phone]   Fax:  [phone]  Name: Esperanza HeirOllis M Mccalip MRN: [id number] Date of Birth: 1960/10/21

## 2016-11-08 NOTE — Patient Instructions (Signed)
  http://ss.exer.us/38   Copyright  VHI. All rights reserved.  Lower Abdominals With Heel Slide    Start with both legs up. Lower one leg and slide foot until knee is straight. Return, by sliding foot up and lifting knee. Keep back and pelvis neutral. Do ___ times, each leg. Do ___ sets, ___ times per day.  http://ss.exer.us/32   Copyright  VHI. All rights reserved.  Bike With Lower Abdominals Isometric    Alternate straightening slightly and bending legs in bike motion. Keep pelvis and back still. Do ___ times, ___ times per day.  http://ss.exer.us/42   Copyright  VHI. All rights reserved.  Drunken Sailor Leg Lift    Feet turned out, squat. Shift weight to one leg lifting other leg. Repeat to other side. Keep feet and knees facing out. Do ___ times, ___ times per day.  http://ss.exer.us/156   Copyright  VHI. All rights reserved.

## 2016-11-11 ENCOUNTER — Encounter: Payer: Self-pay | Admitting: Physical Therapy

## 2016-11-11 ENCOUNTER — Encounter: Payer: Self-pay | Admitting: Occupational Therapy

## 2016-11-11 ENCOUNTER — Ambulatory Visit: Payer: 59 | Admitting: Physical Therapy

## 2016-11-11 DIAGNOSIS — M545 Low back pain, unspecified: Secondary | ICD-10-CM

## 2016-11-11 DIAGNOSIS — R2681 Unsteadiness on feet: Secondary | ICD-10-CM | POA: Diagnosis not present

## 2016-11-11 DIAGNOSIS — R2689 Other abnormalities of gait and mobility: Secondary | ICD-10-CM

## 2016-11-11 DIAGNOSIS — I69353 Hemiplegia and hemiparesis following cerebral infarction affecting right non-dominant side: Secondary | ICD-10-CM

## 2016-11-11 DIAGNOSIS — M6281 Muscle weakness (generalized): Secondary | ICD-10-CM

## 2016-11-11 NOTE — Therapy (Signed)
[facility] [address] [place], [place], [address] Phone: [phone]   Fax:  [phone]  Physical Therapy Treatment  Patient Details  Name: Darrell HeirOllis M Isaacks MRN: [id number] Date of Birth: March 14, 1961 Referring Provider: Erick ColaceAndrew E Kirsteins, MD  Encounter Date: 11/11/2016      PT End of Session - 11/11/16 0951    Visit Number 5   Number of Visits 17   Date for PT Re-Evaluation 12/20/16   Authorization Type UHC-60 visit limit combined   PT Start Time 0845   PT Stop Time 0938   PT Time Calculation (min) 53 min   Activity Tolerance Patient tolerated treatment well   Behavior During Therapy [facility] for tasks assessed/performed      Past Medical History:  Diagnosis Date  . Anemia   . Arthritis   . Tachycardia     Past Surgical History:  Procedure Laterality Date  . ABDOMINAL EXPOSURE N/A 06/27/2016   Procedure: ABDOMINAL EXPOSURE;  Surgeon: Chuck Hinthristopher S Dickson, MD;  [facility];  Service: Vascular;  Laterality: N/A;  . ANTERIOR LUMBAR FUSION N/A 06/27/2016   Procedure: ANTERIOR LUMBAR FUSION L5-S1;  Surgeon: Venita Lickahari Brooks, MD;  Location: [facility] OR;  Service: Orthopedics;  Laterality: N/A;  Requests 3 hours  . FINGER AMPUTATION Left   . SHOULDER SURGERY      There were no vitals filed for this visit.      Subjective Assessment - 11/11/16 0850    Subjective Still has tightness in back & leg first thing in morning.    Pertinent History lumbar fusion 06/27/16, L CVA with R hemiparesis   Limitations Standing;Walking   Patient Stated Goals improve RLE strength; reports RUE strength has improved from working out at gym   Currently in Pain? Yes   Pain Score 2    Pain Location Back   Pain Orientation Right;Lower   Pain Descriptors / Indicators Aching   Pain Type Acute pain   Pain Onset 1 to 4 weeks ago   Pain Frequency Occasional   Aggravating Factors  first thing in morning   Pain Relieving Factors slow controlled  motions                         [facility] Adult PT Treatment/Exercise - 11/11/16 0845      Ambulation/Gait   Stairs Yes   Stairs Assistance 5: Supervision   Stairs Assistance Details (indicate cue type and reason) demo & verbal cues on proper wt shift & LE motion. Added 45* turn for facilitation of abductors & adductors. Pt verbalized and return demo understanding.    Stair Management Technique One rail Right;One rail Left;Alternating pattern;Forwards;Sideways   Number of Stairs 4  5 reps   Height of Stairs 6     Exercises   Exercises Knee/Hip     Knee/Hip Exercises: Aerobic   Elliptical LE only motion, level 1 for 1 min forward & 1 min backward; PT instructed verbally in rational/ benefits to machine, getting on/off safely, resistance level and building muscle endurance.    Nustep 5 min using LEs level 5; PT verbally instructed in set-up, resistance level, rationale / benefits for machine and building up muscle endurance.     PT demo / instructed in bent over rows that he reports performing with both legs straight. PT changed to one leg bent on bed & manual cues to engage core. Pt performed 10 reps 10# with each UE and reported less back  strain.            PT Education - 11/11/16 0947    Education provided Yes   Education Details Reviewed gym machines with emphasis on posture & control over amount of weight. updated HEP: stretches for low back esp first thing in am when back is sore; cardio equip to facilitate alternate LE flexion / extension and stairs.    Person(s) Educated Patient   Methods Explanation;Demonstration;Tactile cues;Verbal cues;Handout   Comprehension Verbalized understanding;Returned demonstration;Verbal cues required;Tactile cues required;Need further instruction          PT Short Term Goals - 11/06/16 1923      PT SHORT TERM GOAL #1   Title TARGET DATE FOR ALL STG 11/20/16: Pt will improve functional LE strength by decreasing five times sit  to stand time to < or = to 12 seconds   Baseline 14.76 seconds   Time 4   Period Weeks   Status On-going     PT SHORT TERM GOAL #2   Title improve BERG balance score to >/= 50/56 for improved balance    Baseline 41/56   Time 4   Period Weeks   Status On-going     PT SHORT TERM GOAL #3   Title improve gait velocity to > 2.25 ft/sec for improved safety during community ambulation   Baseline 2.05 ft/sec   Time 4   Period Weeks   Status On-going     PT SHORT TERM GOAL #4   Title Will negotiate 8 stairs with one rail with alternating sequence and will ambulate outside x 500' over uneven grass and pavement MOD I    Status On-going     PT SHORT TERM GOAL #5   Title Participate in FGA assessment with LTG to be set   Baseline 16/30 on 10/31/16   Time 4   Period Weeks   Status Achieved           PT Long Term Goals - 11/06/16 1924      PT LONG TERM GOAL #1   Title TARGET DATE FOR ALL LTG 12/20/16: pt will be independent with HEP and will demonstrate safe technique for performing quad, glute, hamstring, gastroc/DF strengthening at gym and safe use of treadmill (use of machines)   Time 8   Period Weeks   Status On-going     PT LONG TERM GOAL #2   Title improve BERG balance score to >/= 53/56 for decreased fall risk    Time 8   Period Weeks   Status On-going     PT LONG TERM GOAL #3   Title improve gait velocity to >/= 2.62 ft/sec for improved community access    Time 8   Period Weeks   Status On-going     PT LONG TERM GOAL #4   Title amb > 1,000 feet on uneven paved, curb and grassy outdoor surfaces at MOD I for improved mobility to enter/exit work   Time 8   Period Weeks   Status On-going     PT LONG TERM GOAL #5   Title Pt will improve FGA to >22/30 for improved balance/safety during more dynamic gait challenges   Baseline TBD   Time 8   Period Weeks   Status On-going     PT LONG TERM GOAL #6   Title Pt will demonstrate safe lifting techniques (up to 25 lb)  from various height surfaces and will carry objects x 150' over level surfaces with improved body mechanics.  Time 8   Period Weeks   Status On-going     PT LONG TERM GOAL #7   Title Will improve FOTO Neuro QOL LE by 20 points   Baseline 40   Time 8   Period Weeks   Status On-going     PT LONG TERM GOAL #8   Title Pt will improve FGA to 20/23 in order to indicate decreased fall risk.     Baseline 16/30 at baseline   Time 8   Period Weeks   Status On-going               Plan - 11/11/16 0951    Clinical Impression Statement Patient appears to understand stretches & how they could help with his morning pain & stiffness. He also improved with equipment & activities that required alternating LE flexion & extension.    Rehab Potential Good   Clinical Impairments Affecting Rehab Potential recent lumbar surgery-pain and mm tightness   PT Frequency 2x / week   PT Duration 8 weeks   PT Treatment/Interventions ADLs/Self Care Home Management;Cryotherapy;Electrical Stimulation;Moist Heat;Neuromuscular re-education;Balance training;Therapeutic exercise;Therapeutic activities;Functional mobility training;Patient/family education;Gait training;DME Instruction;Stair training;Orthotic Fit/Training;Taping;Passive range of motion   PT Next Visit Plan progress NMR for hamstring and core activation with emphasis on R weight shfit; gait/endurance outside; check on how patient is performing gym exercises to ensure safety    Consulted and Agree with Plan of Care Patient      Patient will benefit from skilled therapeutic intervention in order to improve the following deficits and impairments:  Abnormal gait, Decreased balance, Decreased mobility, Decreased strength, Pain, Difficulty walking, Improper body mechanics, Decreased range of motion  Visit Diagnosis: Unsteadiness on feet  Muscle weakness (generalized)  Other abnormalities of gait and mobility  Hemiplegia and hemiparesis following  cerebral infarction affecting right non-dominant side (HCC)  Acute midline low back pain without sciatica     Problem List Patient Active Problem List   Diagnosis Date Noted  . Abnormality of gait as late effect of stroke 08/30/2016  . Hemiparesis affecting dominant side as late effect of cerebrovascular accident (CVA) (HCC) 08/02/2016  . Disorder of lumbosacral plexus 08/02/2016  . Small vessel disease, cerebrovascular 07/05/2016  . Right sided weakness 07/03/2016  . Facial droop 07/03/2016  . Stroke (HCC) 07/03/2016  . Anemia   . Tachycardia   . Back pain 06/27/2016    Khaleem Burchill PT, DPT 11/11/2016, 9:55 AM  [place] [facility] [address] [place], [place], [address] Phone: [phone]   Fax:  [phone]  Name: BENNETT RAM MRN: [id number] Date of Birth: [birth year]

## 2016-11-11 NOTE — Patient Instructions (Addendum)
Supine Knee-to-Chest, Unilateral    Lie on back, hands clasped behind one knee. Pull knee in toward chest until a comfortable stretch is felt in lower back and buttocks. Hold __15_ seconds.  Repeat __2-3_ times each leg per session. Do _1-2__ sessions per day.  Copyright  VHI. All rights reserved.  Supine Knee-to-Chest, Bilateral    Lie on back, hands clasped behind both knees. Bring one leg up & down at a time.  Pull knees in toward chest until a comfortable stretch is felt in lower back and buttocks. Hold _15__ seconds. Repeat _2-3__ times per session. Do _1-2__ sessions per day.  Copyright  VHI. All rights reserved.  Supine Hamstring Stretch    Lift one leg, placing hands behind thigh. Gently pull leg toward torso until a stretch is felt. Other leg is bent and back is flat against floor. Repeat with other leg. Repeat __15__ times. Do _2-3___  Reps per leg for 1-2 sessions per day.  http://gt2.exer.us/828   Copyright  VHI. All rights reserved.  Lumbar Rotation: Caudal - Bilateral (Supine)    Feet and knees together, arms outstretched, rotate knees left, turning head in opposite direction, until stretch is felt. Hold _15___ seconds. Relax. Repeat __2-3__ times per direction. Do __1-2__ sessions per day.  http://orth.exer.us/1020   Copyright  VHI. All rights reserved.  SUPINE KNEE BEND: Mobilization V     Lie on table, left knee pulled toward chest, other leg, including thigh, hanging over edge. Use sheet tied around ankle to assist. Bend hanging knee _2-3_ repetitions per session. Do __1-2_ sessions per day.  Copyright  VHI. All rights reserved.

## 2016-11-14 ENCOUNTER — Ambulatory Visit: Payer: 59

## 2016-11-14 DIAGNOSIS — M6281 Muscle weakness (generalized): Secondary | ICD-10-CM

## 2016-11-14 DIAGNOSIS — R2681 Unsteadiness on feet: Secondary | ICD-10-CM | POA: Diagnosis not present

## 2016-11-14 DIAGNOSIS — R2689 Other abnormalities of gait and mobility: Secondary | ICD-10-CM

## 2016-11-14 NOTE — Therapy (Signed)
[facility] [address] [place], [place], [address] Phone: [phone]   Fax:  [phone]  Physical Therapy Treatment  Patient Details  Name: Darrell Watson MRN: [id number] Date of Birth: 1960/12/15 Referring Provider: Erick Colace, MD  Encounter Date: 11/14/2016      PT End of Session - 11/14/16 0821    Visit Number 6   Number of Visits 17   Date for PT Re-Evaluation 12/20/16   Authorization Type UHC-60 visit limit combined   PT Start Time 0806   PT Stop Time 0845   PT Time Calculation (min) 39 min   Activity Tolerance Patient tolerated treatment well   Behavior During Therapy [facility] for tasks assessed/performed      Past Medical History:  Diagnosis Date  . Anemia   . Arthritis   . Tachycardia     Past Surgical History:  Procedure Laterality Date  . ABDOMINAL EXPOSURE N/A 06/27/2016   Procedure: ABDOMINAL EXPOSURE;  Surgeon: Chuck Hint, MD;  Location: [facility] OR;  Service: Vascular;  Laterality: N/A;  . ANTERIOR LUMBAR FUSION N/A 06/27/2016   Procedure: ANTERIOR LUMBAR FUSION L5-S1;  Surgeon: Venita Lick, MD;  Location: [facility] OR;  Service: Orthopedics;  Laterality: N/A;  Requests 3 hours  . FINGER AMPUTATION Left   . SHOULDER SURGERY      There were no vitals filed for this visit.      Subjective Assessment - 11/14/16 0808    Subjective Pt denied falls since last visit. Pt reported LBP this morning, as he did not stretch prior to getting OOB.    Pertinent History lumbar fusion 06/27/16, L CVA with R hemiparesis   Patient Stated Goals improve RLE strength; reports RUE strength has improved from working out at gym   Currently in Pain? Yes   Pain Score 1    Pain Location Back   Pain Orientation Lower   Pain Descriptors / Indicators Aching   Pain Type Acute pain   Pain Onset Today   Pain Frequency Intermittent   Aggravating Factors  worse in the morning   Pain Relieving Factors stretches          Therex: Pt performed in stretches supine to decr. LBP prior to balance and strengthening activities. -B DKTC 2x30 sec. Holds -B hamstring stretches 2x30 sec. Holds -B SKTC 2x30 sec. Holds -Bridges with BLEs on physioball and abdominals activated. 3x10 reps -BLEs on physioball pt performed DKTC to activate hamstrings 3x10 reps. -Single LE (R side) with L heel support to reduce LBP bridges x10 reps. Cues and demonstration for technique and to correctly activate transverse abdominis muscle.                          PT Short Term Goals - 11/06/16 1923      PT SHORT TERM GOAL #1   Title TARGET DATE FOR ALL STG 11/20/16: Pt will improve functional LE strength by decreasing five times sit to stand time to < or = to 12 seconds   Baseline 14.76 seconds   Time 4   Period Weeks   Status On-going     PT SHORT TERM GOAL #2   Title improve BERG balance score to >/= 50/56 for improved balance    Baseline 41/56   Time 4   Period Weeks   Status On-going     PT SHORT TERM GOAL #3   Title improve gait velocity to > 2.25 ft/sec  for improved safety during community ambulation   Baseline 2.05 ft/sec   Time 4   Period Weeks   Status On-going     PT SHORT TERM GOAL #4   Title Will negotiate 8 stairs with one rail with alternating sequence and will ambulate outside x 500' over uneven grass and pavement MOD I    Status On-going     PT SHORT TERM GOAL #5   Title Participate in FGA assessment with LTG to be set   Baseline 16/30 on 10/31/16   Time 4   Period Weeks   Status Achieved           PT Long Term Goals - 11/06/16 1924      PT LONG TERM GOAL #1   Title TARGET DATE FOR ALL LTG 12/20/16: pt will be independent with HEP and will demonstrate safe technique for performing quad, glute, hamstring, gastroc/DF strengthening at gym and safe use of treadmill (use of machines)   Time 8   Period Weeks   Status On-going     PT LONG TERM GOAL #2   Title improve BERG  balance score to >/= 53/56 for decreased fall risk    Time 8   Period Weeks   Status On-going     PT LONG TERM GOAL #3   Title improve gait velocity to >/= 2.62 ft/sec for improved community access    Time 8   Period Weeks   Status On-going     PT LONG TERM GOAL #4   Title amb > 1,000 feet on uneven paved, curb and grassy outdoor surfaces at MOD I for improved mobility to enter/exit work   Time 8   Period Weeks   Status On-going     PT LONG TERM GOAL #5   Title Pt will improve FGA to >22/30 for improved balance/safety during more dynamic gait challenges   Baseline TBD   Time 8   Period Weeks   Status On-going     PT LONG TERM GOAL #6   Title Pt will demonstrate safe lifting techniques (up to 25 lb) from various height surfaces and will carry objects x 150' over level surfaces with improved body mechanics.   Time 8   Period Weeks   Status On-going     PT LONG TERM GOAL #7   Title Will improve FOTO Neuro QOL LE by 20 points   Baseline 40   Time 8   Period Weeks   Status On-going     PT LONG TERM GOAL #8   Title Pt will improve FGA to 20/23 in order to indicate decreased fall risk.     Baseline 16/30 at baseline   Time 8   Period Weeks   Status On-going               Plan - 11/14/16 [id number]    Clinical Impression Statement First 10 minutes of session focused on stretches to reduce LBP. Pt demonstrated progress, as he required less cues for technique during core and hamstring strengthening. However, pt continues to require cues to not hold breath during core activities. Continue with POC.    Rehab Potential Good   Clinical Impairments Affecting Rehab Potential recent lumbar surgery-pain and mm tightness   PT Frequency 2x / week   PT Duration 8 weeks   PT Treatment/Interventions ADLs/Self Care Home Management;Cryotherapy;Electrical Stimulation;Moist Heat;Neuromuscular re-education;Balance training;Therapeutic exercise;Therapeutic activities;Functional mobility  training;Patient/family education;Gait training;DME Instruction;Stair training;Orthotic Fit/Training;Taping;Passive range of motion   PT Next Visit Plan  gait/endurance outside; check on how patient is performing gym exercises to ensure safety    Consulted and Agree with Plan of Care Patient      Patient will benefit from skilled therapeutic intervention in order to improve the following deficits and impairments:  Abnormal gait, Decreased balance, Decreased mobility, Decreased strength, Pain, Difficulty walking, Improper body mechanics, Decreased range of motion  Visit Diagnosis: Muscle weakness (generalized)  Other abnormalities of gait and mobility     Problem List Patient Active Problem List   Diagnosis Date Noted  . Abnormality of gait as late effect of stroke 08/30/2016  . Hemiparesis affecting dominant side as late effect of cerebrovascular accident (CVA) (HCC) 08/02/2016  . Disorder of lumbosacral plexus 08/02/2016  . Small vessel disease, cerebrovascular 07/05/2016  . Right sided weakness 07/03/2016  . Facial droop 07/03/2016  . Stroke (HCC) 07/03/2016  . Anemia   . Tachycardia   . Back pain 06/27/2016    Drexler Maland L 11/14/2016, 8:27 AM   [facility] [address] [place], [place], [address] Phone: [phone]   Fax:  [phone]  Name: Darrell Watson MRN: [id number] Date of Birth: 1961-04-06  Zerita Boers, PT,DPT 11/14/16 8:46 AM Phone: [phone] Fax: [phone]

## 2016-11-15 ENCOUNTER — Encounter: Payer: Self-pay | Admitting: Physical Medicine & Rehabilitation

## 2016-11-15 ENCOUNTER — Ambulatory Visit (HOSPITAL_BASED_OUTPATIENT_CLINIC_OR_DEPARTMENT_OTHER): Payer: 59 | Admitting: Physical Medicine & Rehabilitation

## 2016-11-15 ENCOUNTER — Encounter: Payer: 59 | Attending: Physical Medicine & Rehabilitation

## 2016-11-15 VITALS — BP 144/87 | HR 85

## 2016-11-15 DIAGNOSIS — Z981 Arthrodesis status: Secondary | ICD-10-CM | POA: Insufficient documentation

## 2016-11-15 DIAGNOSIS — I69398 Other sequelae of cerebral infarction: Secondary | ICD-10-CM | POA: Diagnosis not present

## 2016-11-15 DIAGNOSIS — R269 Unspecified abnormalities of gait and mobility: Secondary | ICD-10-CM | POA: Diagnosis not present

## 2016-11-15 DIAGNOSIS — G541 Lumbosacral plexus disorders: Secondary | ICD-10-CM

## 2016-11-15 DIAGNOSIS — I69359 Hemiplegia and hemiparesis following cerebral infarction affecting unspecified side: Secondary | ICD-10-CM

## 2016-11-15 DIAGNOSIS — I69351 Hemiplegia and hemiparesis following cerebral infarction affecting right dominant side: Secondary | ICD-10-CM | POA: Diagnosis present

## 2016-11-15 NOTE — Progress Notes (Signed)
Subjective:    Patient ID: Darrell Watson, male    DOB: 1960/08/12, 56 y.o.   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 General activity 1 Relation with others 1 Enjoyment of life 8 What TIME of day is your pain at its worst? morning Sleep (in general) Fair  Pain is worse with: sitting and standing Pain improves with: . Relief from Meds: .  Mobility walk without assistance do you drive?  yes  Function employed # of hrs/week .  Neuro/Psych bowel control problems  Prior Studies Any changes since last visit?  no  Physicians involved in your care Any changes since last visit?  no   No family history on file. Social History   Social History  . Marital status: Married    Spouse name: N/A  . Number of children: N/A  . Years of education: N/A   Social History Main Topics  . Smoking status: Never Smoker  . Smokeless tobacco: Never Used  . Alcohol use Yes     Comment: BEER  OCC  . Drug use: No  . Sexual activity: Not on file   Other Topics Concern  . Not on file   Social History Narrative  . No narrative on file   Past Surgical History:  Procedure Laterality Date  . ABDOMINAL EXPOSURE N/A 06/27/2016   Procedure: ABDOMINAL EXPOSURE;  Surgeon: Chuck Hinthristopher S Dickson, MD;  [facility];  Service: Vascular;  Laterality: N/A;  . ANTERIOR LUMBAR FUSION N/A 06/27/2016   Procedure: ANTERIOR LUMBAR FUSION L5-S1;  Surgeon: Venita Lickahari Brooks, MD;  Location: [facility] OR;  Service: Orthopedics;  Laterality: N/A;  Requests 3 hours  . FINGER AMPUTATION  Left   . SHOULDER SURGERY     Past Medical History:  Diagnosis Date  . Anemia   . Arthritis   . Tachycardia    There were no vitals taken for this visit.  Opioid Risk Score:   Fall Risk Score:  `1  Depression screen PHQ 2/9  Depression screen PHQ 2/9 08/02/2016  Decreased Interest 0  Down, Depressed, Hopeless 0  PHQ - 2 Score 0  Altered sleeping 0  Tired, decreased energy 0  Change in appetite 0  Feeling bad or failure about yourself  0  Trouble concentrating 0  Moving slowly or fidgety/restless 0  Suicidal thoughts 0  PHQ-9 Score 0  Difficult doing work/chores Not difficult at all     Review of Systems  Constitutional: Negative.   HENT: Negative.   Eyes: Negative.   Respiratory: Negative.   Cardiovascular: Negative.   Gastrointestinal: Negative.   Endocrine: Negative.   Genitourinary: Negative.   Musculoskeletal: Negative.   Skin: Negative.   Allergic/Immunologic: Negative.   Neurological: Negative.   Hematological: Negative.   Psychiatric/Behavioral: Negative.   All other systems reviewed and are negative.      Objective:   Physical Exam  Constitutional: He is oriented to person, place, and time. He appears well-developed and well-nourished.  HENT:  Head: Normocephalic and atraumatic.  Eyes: Pupils  are equal, round, and reactive to light. Conjunctivae and EOM are normal.  Neck: Thyromegaly present.  Neurological: He is alert and oriented to person, place, and time.  Psychiatric: He has a normal mood and affect. His behavior is normal. Judgment and thought content normal.  Nursing note and vitals reviewed.  Cannot do 1 legged squats On the right side with left side 5/5 strength in bilateral deltoid by stress, grip/5. Bilateral hip flexor, knee extensor, ankle dorsiflexor  Gait is without evidence of toe drag or knee instability. However, he has a stiff leg gait on the right side     Assessment & Plan:   1. History of left subcortical infarct  causing right hemiparesis. Overall making some improvements, but not back to baseline. He feels like his upper body strength has actually improved more quickly than lower body.  Continue outpatient PT  2. 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

## 2016-11-15 NOTE — Patient Instructions (Signed)
Not ready for return to work

## 2016-11-18 ENCOUNTER — Encounter: Payer: Self-pay | Admitting: Physical Therapy

## 2016-11-18 ENCOUNTER — Ambulatory Visit: Payer: 59 | Admitting: Physical Therapy

## 2016-11-18 VITALS — BP 130/89 | HR 96

## 2016-11-18 DIAGNOSIS — M545 Low back pain, unspecified: Secondary | ICD-10-CM

## 2016-11-18 DIAGNOSIS — M6281 Muscle weakness (generalized): Secondary | ICD-10-CM

## 2016-11-18 DIAGNOSIS — R2689 Other abnormalities of gait and mobility: Secondary | ICD-10-CM

## 2016-11-18 DIAGNOSIS — I69353 Hemiplegia and hemiparesis following cerebral infarction affecting right non-dominant side: Secondary | ICD-10-CM

## 2016-11-18 DIAGNOSIS — R2681 Unsteadiness on feet: Secondary | ICD-10-CM | POA: Diagnosis not present

## 2016-11-18 NOTE — Therapy (Signed)
[facility] [address] [place], [place], [address] Phone: [phone]   Fax:  [phone]  Physical Therapy Treatment  Patient Details  Name: Darrell Watson MRN: [id number] Date of Birth: 1960-09-16 Referring Provider: Erick Colace, MD  Encounter Date: 11/18/2016      PT End of Session - 11/18/16 0942    Visit Number 7   Number of Visits 17   Date for PT Re-Evaluation 12/20/16   Authorization Type UHC-60 visit limit combined   Authorization - Visit Number 25  HH and outpatient combined   Authorization - Number of Visits 60   PT Start Time 0935   PT Stop Time 1022   PT Time Calculation (min) 47 min   Activity Tolerance Patient tolerated treatment well   Behavior During Therapy [facility] for tasks assessed/performed      Past Medical History:  Diagnosis Date  . Anemia   . Arthritis   . Tachycardia     Past Surgical History:  Procedure Laterality Date  . ABDOMINAL EXPOSURE N/A 06/27/2016   Procedure: ABDOMINAL EXPOSURE;  Surgeon: Chuck Hint, MD;  Location: [facility] OR;  Service: Vascular;  Laterality: N/A;  . ANTERIOR LUMBAR FUSION N/A 06/27/2016   Procedure: ANTERIOR LUMBAR FUSION L5-S1;  Surgeon: Venita Lick, MD;  Location: [facility] OR;  Service: Orthopedics;  Laterality: N/A;  Requests 3 hours  . FINGER AMPUTATION Left   . SHOULDER SURGERY      Vitals:   11/18/16 0949 11/18/16 0951  BP: (!) 149/99 130/89  Pulse: 96         Subjective Assessment - 11/18/16 0939    Subjective Reported a little back pain this morning after waking up.     Pertinent History lumbar fusion 06/27/16, L CVA with R hemiparesis   Limitations Standing;Walking   How long can you stand comfortably? 2-3 hours doing yard work-but LE fatigues quickly   How long can you walk comfortably? at least 10 min   Patient Stated Goals improve RLE strength; reports RUE strength has improved from working out at gym   Currently in Pain?  No/denies                         [facility] Adult PT Treatment/Exercise - 11/18/16 0953      Knee/Hip Exercises: Aerobic   Tread Mill x 4:40 minutes x 1.5 mph with gait trainer with step length initially at 60cm bilaterally but with manual facilitation at pelvis for stability, lateral weight shifting and anterior rotation on R side step length increased to 64cm.  Also performed retro walking on treadmill at .5 mph to focus on increasing hip extension ROM and hamstring/glute strengthening x 4 minutes     Knee/Hip Exercises: Standing   Forward Lunges Right;Left;1 set  3 reps each leg focus on proper technique   Wall Squat 3 sets;Other (comment)  20 second hold                PT Education - 11/18/16 1226    Education provided Yes   Education Details advised to continue low back stretches before getting OOB in am; initiated treadmill training and LE strengthening for gym   Person(s) Educated Patient   Methods Explanation;Demonstration   Comprehension Need further instruction          PT Short Term Goals - 11/06/16 1923      PT SHORT TERM GOAL #1   Title TARGET DATE FOR ALL  STG 11/20/16: Pt will improve functional LE strength by decreasing five times sit to stand time to < or = to 12 seconds   Baseline 14.76 seconds   Time 4   Period Weeks   Status On-going     PT SHORT TERM GOAL #2   Title improve BERG balance score to >/= 50/56 for improved balance    Baseline 41/56   Time 4   Period Weeks   Status On-going     PT SHORT TERM GOAL #3   Title improve gait velocity to > 2.25 ft/sec for improved safety during community ambulation   Baseline 2.05 ft/sec   Time 4   Period Weeks   Status On-going     PT SHORT TERM GOAL #4   Title Will negotiate 8 stairs with one rail with alternating sequence and will ambulate outside x 500' over uneven grass and pavement MOD I    Status On-going     PT SHORT TERM GOAL #5   Title Participate in FGA assessment with LTG  to be set   Baseline 16/30 on 10/31/16   Time 4   Period Weeks   Status Achieved           PT Long Term Goals - 11/06/16 1924      PT LONG TERM GOAL #1   Title TARGET DATE FOR ALL LTG 12/20/16: pt will be independent with HEP and will demonstrate safe technique for performing quad, glute, hamstring, gastroc/DF strengthening at gym and safe use of treadmill (use of machines)   Time 8   Period Weeks   Status On-going     PT LONG TERM GOAL #2   Title improve BERG balance score to >/= 53/56 for decreased fall risk    Time 8   Period Weeks   Status On-going     PT LONG TERM GOAL #3   Title improve gait velocity to >/= 2.62 ft/sec for improved community access    Time 8   Period Weeks   Status On-going     PT LONG TERM GOAL #4   Title amb > 1,000 feet on uneven paved, curb and grassy outdoor surfaces at MOD I for improved mobility to enter/exit work   Time 8   Period Weeks   Status On-going     PT LONG TERM GOAL #5   Title Pt will improve FGA to >22/30 for improved balance/safety during more dynamic gait challenges   Baseline TBD   Time 8   Period Weeks   Status On-going     PT LONG TERM GOAL #6   Title Pt will demonstrate safe lifting techniques (up to 25 lb) from various height surfaces and will carry objects x 150' over level surfaces with improved body mechanics.   Time 8   Period Weeks   Status On-going     PT LONG TERM GOAL #7   Title Will improve FOTO Neuro QOL LE by 20 points   Baseline 40   Time 8   Period Weeks   Status On-going     PT LONG TERM GOAL #8   Title Pt will improve FGA to 20/23 in order to indicate decreased fall risk.     Baseline 16/30 at baseline   Time 8   Period Weeks   Status On-going               Plan - 11/18/16 1227    Clinical Impression Statement Treatment session continued to focus on gait  training and NMR with use of treadmill for improved gait sequencing, motor planning and gait velocity as well as retro gait for  posterior hip mm activation training.  Pt demonstrates improved gait technique when manual stabilization provided at pelvis.  Will continue to focus on core and proximal stability.  Also initiated LE strengthening that pt would be safe to perform at gym; will continue to address.   Rehab Potential Good   Clinical Impairments Affecting Rehab Potential recent lumbar surgery-pain and mm tightness   PT Frequency 2x / week   PT Duration 8 weeks   PT Treatment/Interventions ADLs/Self Care Home Management;Cryotherapy;Electrical Stimulation;Moist Heat;Neuromuscular re-education;Balance training;Therapeutic exercise;Therapeutic activities;Functional mobility training;Patient/family education;Gait training;DME Instruction;Stair training;Orthotic Fit/Training;Taping;Passive range of motion   PT Next Visit Plan CHECK STG NEXT SESSION: continue to focus on LE strengthening and NMR pt can perform at gym: bridges if it doesn't cause back pain, wall squats, lunges, tall and half kneeling, quadruped, physioball for core training   Consulted and Agree with Plan of Care Patient      Patient will benefit from skilled therapeutic intervention in order to improve the following deficits and impairments:  Abnormal gait, Decreased balance, Decreased mobility, Decreased strength, Pain, Difficulty walking, Improper body mechanics, Decreased range of motion  Visit Diagnosis: Hemiplegia and hemiparesis following cerebral infarction affecting right non-dominant side (HCC)  Muscle weakness (generalized)  Other abnormalities of gait and mobility  Unsteadiness on feet  Acute midline low back pain without sciatica     Problem List Patient Active Problem List   Diagnosis Date Noted  . Abnormality of gait as late effect of stroke 08/30/2016  . Hemiparesis affecting dominant side as late effect of cerebrovascular accident (CVA) (HCC) 08/02/2016  . Disorder of lumbosacral plexus 08/02/2016  . Small vessel disease,  cerebrovascular 07/05/2016  . Right sided weakness 07/03/2016  . Facial droop 07/03/2016  . Stroke (HCC) 07/03/2016  . Anemia   . Tachycardia   . Back pain 06/27/2016   Edman CircleAudra Hall, PT, DPT 11/18/16    12:33 PM    [facility] [address] [place], [place], [address] Phone: [phone]   Fax:  [phone]  Name: Esperanza HeirOllis M Tietze MRN: [id number] Date of Birth: 02-20-1961

## 2016-11-19 ENCOUNTER — Ambulatory Visit (INDEPENDENT_AMBULATORY_CARE_PROVIDER_SITE_OTHER): Payer: 59 | Admitting: Nurse Practitioner

## 2016-11-19 ENCOUNTER — Encounter: Payer: Self-pay | Admitting: Nurse Practitioner

## 2016-11-19 VITALS — BP 136/83 | HR 98 | Wt 195.0 lb

## 2016-11-19 DIAGNOSIS — I679 Cerebrovascular disease, unspecified: Secondary | ICD-10-CM

## 2016-11-19 DIAGNOSIS — I639 Cerebral infarction, unspecified: Secondary | ICD-10-CM | POA: Diagnosis not present

## 2016-11-19 NOTE — Progress Notes (Signed)
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 He is currently on aspirin for secondary stroke prevention without further stroke or TIA symptoms.He has no bruising and no bleeding. Blood pressure in the office today 136/83. He has not had other stroke risk factors. Cardiac event monitoring was negative for atrial fibrillation.He has had a sleep evaluation and is scheduled for a sleep study. He has stopped his testosterone. He is currently getting some physical therapy he had spinal fusion back in March 2018. He returns for reevaluation  08/20/16 VP42 year old right-handed male with L5-S1 fusion on 06/28/2016, had sudden onset right leg weakness and facial weakness on 07/03/2016. Patient presented to the hospital was admitted for stroke workup. Patient was found to have small acute ischemic infarction in left posterior limb of internal capsule and a punctate right anterior temporal ischemic infarction. No other stroke risk factors were identified.  Patient was found to have significant decline in hemoglobin as well as intramuscular hematoma in the left psoas and left rectus muscles.  Since that time patient has done well. He is continuing to do therapy and plans to transition to water therapy. Patient reports history of interrupted sleep, snoring, daytime fatigue. Patient reports drinking 1-2 alcoholic drinks per month. Patient is continuing on aspirin 81 mg daily.     REVIEW OF SYSTEMS: Full 14 system review of systems performed and notable only for those listed, all others are neg:  Constitutional: neg  Cardiovascular: neg Ear/Nose/Throat: neg  Skin: neg Eyes: neg Respiratory: neg Gastroitestinal: neg  Hematology/Lymphatic: neg    Endocrine: neg Musculoskeletal:neg Allergy/Immunology: neg Neurological: neg Psychiatric: neg Sleep : snoring   ALLERGIES: No Known Allergies  HOME MEDICATIONS: Outpatient Medications Prior to Visit  Medication Sig Dispense Refill  . aspirin EC 81 MG EC tablet Take 1 tablet (81 mg total) by mouth daily.    Marland Kitchen testosterone cypionate (DEPOTESTOSTERONE CYPIONATE) 200 MG/ML injection Inject 300 mg into the muscle every 14 (fourteen) days. Gets 1.5 ml at Dr office every 2 weeks Next dose is due 06-24-2016     No facility-administered medications prior to visit.     PAST MEDICAL HISTORY: Past Medical History:  Diagnosis Date  . Anemia   . Arthritis   . Stroke (HCC)   . Tachycardia     PAST SURGICAL HISTORY: Past Surgical History:  Procedure Laterality Date  . ABDOMINAL EXPOSURE N/A 06/27/2016   Procedure: ABDOMINAL EXPOSURE;  Surgeon: Chuck Hint, MD;  Location: [facility] OR;  Service: Vascular;  Laterality: N/A;  . ANTERIOR LUMBAR FUSION N/A 06/27/2016   Procedure: ANTERIOR LUMBAR FUSION L5-S1;  Surgeon: Venita Lick, MD;  Location: [facility] OR;  Service: Orthopedics;  Laterality: N/A;  Requests 3 hours  . FINGER AMPUTATION Left   . SHOULDER SURGERY      FAMILY HISTORY: History reviewed. No pertinent family history.  SOCIAL HISTORY: Social History   Social History  . Marital status: Married    Spouse name: N/A  . Number of children: N/A  . Years of education: N/A   Occupational History  . Not on file.   Social History Main Topics  . Smoking status: Never Smoker  . Smokeless tobacco: Never Used  . Alcohol use Yes     Comment: BEER  OCC  . Drug use: No  .  Sexual activity: Not on file   Other Topics Concern  . Not on file   Social History Narrative  . No narrative on file     PHYSICAL EXAM  Vitals:   11/19/16 1434  BP: 136/83  Pulse: 98  Weight: 195 lb (88.5 kg)   Body mass index is 27.98 kg/m.  Generalized: Well developed, in no acute distress   Head: normocephalic and atraumatic,. Oropharynx benign  Neck: Supple, no carotid bruits  Cardiac: Regular rate rhythm, no murmur  Musculoskeletal: missing fingers 3 through 5 on left hand   Neurological examination   Mentation: Alert oriented to time, place, history taking. Attention span and concentration appropriate. Recent and remote memory intact.  Follows all commands speech and language fluent.   Cranial nerve II-XII: Fundoscopic exam reveals sharp disc margins.Pupils were equal round reactive to light extraocular movements were full, visual field were full on confrontational test. Facial sensation and strength were normal. hearing was intact to finger rubbing bilaterally. Uvula tongue midline. head turning and shoulder shrug were normal and symmetric.Tongue protrusion into cheek strength was normal. Motor: normal bulk and tone, full strength in the BUE, 4 out of 5 bilaterally. Flexors Sensory: normal and symmetric to light touch, pinprick, and  Vibration, in the upper and lower extremities  Coordination: finger-nose-finger, heel-to-shin bilaterally, no dysmetria Reflexes: Brachioradialis 2/2, biceps 2/2, triceps 2/2, patellar 2/2, Achilles 2/2, plantar responses were flexor bilaterally. Gait and Station: Rising up from seated position without assistance, stiff gait, small steppage Tandem gait is unsteady  DIAGNOSTIC DATA (LABS, IMAGING, TESTING) - I reviewed patient records, labs, notes, testing and imaging myself where available.  Lab Results  Component Value Date   WBC 6.6 07/11/2016   HGB 11.2 (L) 07/11/2016   HCT 35.5 (L) 07/11/2016   MCV 87.0 07/11/2016   PLT 406 (H) 07/11/2016      Component Value Date/Time   NA 137 07/11/2016 0436   NA 137 03/05/2012 1351   K 4.1 07/11/2016 0436   K 4.0 03/05/2012 1351   CL 103 07/11/2016 0436   CL 104 03/05/2012 1351   CO2 28 07/11/2016 0436   CO2 28 03/05/2012 1351   GLUCOSE 89 07/11/2016 0436   GLUCOSE 79 03/05/2012 1351    BUN 10 07/11/2016 0436   BUN 13.0 03/05/2012 1351   CREATININE 1.16 07/12/2016 0620   CREATININE 1.4 (H) 03/05/2012 1351   CALCIUM 8.7 (L) 07/11/2016 0436   CALCIUM 9.6 03/05/2012 1351   PROT 6.4 (L) 07/08/2016 0412   PROT 7.4 03/05/2012 1351   ALBUMIN 3.1 (L) 07/08/2016 0412   ALBUMIN 4.2 03/05/2012 1351   AST 26 07/08/2016 0412   AST 33 03/05/2012 1351   ALT 19 07/08/2016 0412   ALT 37 03/05/2012 1351   ALKPHOS 46 07/08/2016 0412   ALKPHOS 62 03/05/2012 1351   BILITOT 1.7 (H) 07/08/2016 0412   BILITOT 0.47 03/05/2012 1351   GFRNONAA >60 07/12/2016 0620   GFRAA >60 07/12/2016 0620   Lab Results  Component Value Date   CHOL 106 07/03/2016   HDL 45 07/03/2016   LDLCALC 39 07/03/2016   TRIG 111 07/03/2016   CHOLHDL 2.4 07/03/2016   Lab Results  Component Value Date   HGBA1C 6.5 (H) 07/03/2016   Lab Results  Component Value Date   VITAMINB12 547 07/04/2016    ASSESSMENT AND PLAN 56 y.o. year old male here with left posterior limb internal capsule stroke (small vessel thrombosis) and punctate right temporal operculum stroke (cryptogenic). [place]  receive IV t-PA due to delay in arrival. No other  stroke risk factors. Patient is scheduled for a sleep study    PLAN: Stressed the importance of management of risk factors to prevent further stroke Continue aspirin for secondary stroke prevention Maintain strict control of hypertension with blood pressure goal below 130/90, today's reading 136/83  Control of diabetes with hemoglobin A1c below 6.5 followed by PCP Cholesterol with LDL cholesterol less than 70, followed by primary care,  Continue PT then HEP ,Exercise by walking eat healthy diet with whole grains,  fresh fruits and vegetables Follow up with Sleep lab for sleep study Follow up in 6 months I spent 25min  in total face to face time with the patient more than 50% of which was spent counseling and coordination of care, reviewing test results reviewing  medications and discussing and reviewing the diagnosis of stroke and managemof risk factors Nilda RiggsNancy Carolyn Martin, [facility] HospitalGNP, [facility]vernaBC, APRN  [facility] [address], [address] [place], [place] [address] [phone]

## 2016-11-19 NOTE — Patient Instructions (Signed)
Stressed the importance of management of risk factors to prevent further stroke Continue aspirin for secondary stroke prevention Maintain strict control of hypertension with blood pressure goal below 130/90, today's reading 136/83  Control of diabetes with hemoglobin A1c below 6.5 followed by PCP Cholesterol with LDL cholesterol less than 70, followed by primary care,  Continue PT the HEP ,Exercise by walking eat healthy diet with whole grains,  fresh fruits and vegetables Follow up with Sleep lab for sleep study Follow up in 6 months

## 2016-11-21 ENCOUNTER — Ambulatory Visit: Payer: 59 | Admitting: Physical Therapy

## 2016-11-27 ENCOUNTER — Ambulatory Visit: Payer: 59 | Attending: Physical Medicine & Rehabilitation | Admitting: Physical Therapy

## 2016-11-27 ENCOUNTER — Encounter: Payer: Self-pay | Admitting: Physical Therapy

## 2016-11-27 VITALS — BP 140/80 | HR 97

## 2016-11-27 DIAGNOSIS — M6281 Muscle weakness (generalized): Secondary | ICD-10-CM | POA: Diagnosis present

## 2016-11-27 DIAGNOSIS — M545 Low back pain, unspecified: Secondary | ICD-10-CM

## 2016-11-27 DIAGNOSIS — I69352 Hemiplegia and hemiparesis following cerebral infarction affecting left dominant side: Secondary | ICD-10-CM | POA: Diagnosis present

## 2016-11-27 DIAGNOSIS — R2689 Other abnormalities of gait and mobility: Secondary | ICD-10-CM | POA: Insufficient documentation

## 2016-11-27 DIAGNOSIS — R278 Other lack of coordination: Secondary | ICD-10-CM | POA: Insufficient documentation

## 2016-11-27 DIAGNOSIS — R2681 Unsteadiness on feet: Secondary | ICD-10-CM | POA: Diagnosis present

## 2016-11-27 DIAGNOSIS — I69353 Hemiplegia and hemiparesis following cerebral infarction affecting right non-dominant side: Secondary | ICD-10-CM | POA: Diagnosis present

## 2016-11-27 NOTE — Therapy (Signed)
[facility] [address] [place], [place], [address] Phone: [phone]   Fax:  [phone]  Physical Therapy Treatment  Patient Details  Name: Darrell Watson MRN: [id number] Date of Birth: [birth year] Referring Provider: Charlett Blake, MD  Encounter Date: 11/27/2016      PT End of Session - 11/27/16 1046    Visit Number 8   Number of Visits 17   Date for PT Re-Evaluation 12/20/16   Authorization Type [id number] visit limit combined   Authorization - Visit Number 64  [place] and outpatient combined   Authorization - Number of Visits 57   PT Start Time 0804   PT Stop Time 3295   PT Time Calculation (min) 43 min   Activity Tolerance Patient tolerated treatment well   Behavior During Therapy [facility] for tasks assessed/performed      Past Medical History:  Diagnosis Date  . Anemia   . Arthritis   . Stroke ([place])   . Tachycardia     Past Surgical History:  Procedure Laterality Date  . ABDOMINAL EXPOSURE N/A 06/27/2016   Procedure: ABDOMINAL EXPOSURE;  Surgeon: Angelia Mould, MD;  Location: [facility];  Service: Vascular;  Laterality: N/A;  . ANTERIOR LUMBAR FUSION N/A 06/27/2016   Procedure: ANTERIOR LUMBAR FUSION L5-S1;  Surgeon: Melina Schools, MD;  Location: [facility];  Service: Orthopedics;  Laterality: N/A;  Requests 3 hours  . FINGER AMPUTATION Left   . SHOULDER SURGERY      Vitals:   11/27/16 0807  BP: 140/80  Pulse: 97  SpO2: 98%        Subjective Assessment - 11/27/16 0807    Subjective Pt went to [place] last week; no issues.  Still having some low back soreness in the morning when transitioning OOB.   Pertinent History lumbar fusion 06/27/16, L CVA with R hemiparesis   Limitations Standing;Walking   How long can you stand comfortably? 2-3 hours doing yard work-but LE fatigues quickly   How long can you walk comfortably? at least 10 min   Patient Stated Goals improve RLE strength; reports RUE strength has  improved from working out at gym   Currently in Pain? Yes   Pain Score 2    Pain Location Back   Pain Orientation Lower   Pain Type Acute pain            [facility] PT Assessment - 11/27/16 0827      Ambulation/Gait   Gait velocity 12.53 seconds or 2.61 ft/sec   Stairs Yes   Stairs Assistance 6: Modified independent (Device/Increase time)   Stair Management Technique One rail Right;Alternating pattern;Forwards   Number of Stairs 8   Height of Stairs 6     Standardized Balance Assessment   Five times sit to stand comments  12.87 seconds   10 Meter Walk 12.53 seconds or 2.61 ft/sec     Berg Balance Test   Sit to Stand Able to stand without using hands and stabilize independently   Standing Unsupported Able to stand safely 2 minutes   Sitting with Back Unsupported but Feet Supported on Floor or Stool Able to sit safely and securely 2 minutes   Stand to Sit Sits safely with minimal use of hands   Transfers Able to transfer safely, minor use of hands   Standing Unsupported with Eyes Closed Able to stand 10 seconds safely   Standing Ubsupported with Feet Together Able to place feet together independently and stand for 1 minute  with supervision   From Standing, Reach Forward with Outstretched Arm Can reach forward >12 cm safely (5")   From Standing Position, Pick up Object from Floor Able to pick up shoe, needs supervision   From Standing Position, Turn to Look Behind Over each Shoulder Looks behind from both sides and weight shifts well   Turn 360 Degrees Able to turn 360 degrees safely in 4 seconds or less   Standing Unsupported, Alternately Place Feet on Step/Stool Able to stand independently and safely and complete 8 steps in 20 seconds   Standing Unsupported, One Foot in Front Able to place foot tandem independently and hold 30 seconds   Standing on One Leg Able to lift leg independently and hold > 10 seconds   Total Score 53   Berg comment: 53/56 low falls risk                      [facility] Adult PT Treatment/Exercise - 11/27/16 0825      Lumbar Exercises: Stretches   Passive Hamstring Stretch 2 reps;30 seconds   Passive Hamstring Stretch Limitations bilat   Single Knee to Chest Stretch 1 rep;20 seconds   Single Knee to Chest Stretch Limitations bilat   Double Knee to Chest Stretch 1 rep;30 seconds   Lower Trunk Rotation Other (comment)   Lower Trunk Rotation Limitations 12 reps   Piriformis Stretch 1 rep;20 seconds   Piriformis Stretch Limitations bilat     Knee/Hip Exercises: Stretches   Hip Flexor Stretch Right;Left;1 rep;60 seconds   Hip Flexor Stretch Limitations leg off side of mat in supine                PT Education - 11/27/16 1045    Education provided Yes   Education Details clinical findings after STG assessment; areas to continue to address   Person(s) Educated Patient   Methods Explanation   Comprehension Verbalized understanding          PT Short Term Goals - 11/27/16 0813      PT SHORT TERM GOAL #1   Title TARGET DATE FOR ALL STG 11/20/16: Pt will improve functional LE strength by decreasing five times sit to stand time to < or = to 12 seconds   Baseline 14.76 seconds; 12.87 seconds improved but not to target   Time 4   Period Weeks   Status Partially Met     PT SHORT TERM GOAL #2   Title improve BERG balance score to >/= 50/56 for improved balance    Baseline 41/56; 53/56   Time 4   Period Weeks   Status Achieved     PT SHORT TERM GOAL #3   Title improve gait velocity to > 2.25 ft/sec for improved safety during community ambulation   Baseline 2.05 ft/sec, 2.6 ft/sec   Time 4   Period Weeks   Status Achieved     PT SHORT TERM GOAL #4   Title Will negotiate 8 stairs with one rail with alternating sequence and will ambulate outside x 500' over uneven grass and pavement MOD I    Status Partially Met     PT SHORT TERM GOAL #5   Title Participate in FGA assessment with LTG to be set    Baseline 16/30 on 10/31/16   Time 4   Period Weeks   Status Achieved           PT Long Term Goals - 11/27/16 1052      PT  LONG TERM GOAL #1   Title TARGET DATE FOR ALL LTG 12/20/16: pt will be independent with HEP and will demonstrate safe technique for performing quad, glute, hamstring, gastroc/DF strengthening at gym and safe use of treadmill (use of machines)   Time 8   Period Weeks   Status On-going     PT LONG TERM GOAL #2   Title improve BERG balance score to >/= 53/56 for decreased fall risk    Baseline 53/56 on 8/1   Time 8   Period Weeks   Status On-going     PT LONG TERM GOAL #3   Title improve gait velocity to >/= 2.62 ft/sec for improved community access    Baseline 2.61 ft/sec on 8/1   Time 8   Period Weeks   Status On-going     PT LONG TERM GOAL #4   Title amb > 1,000 feet on uneven paved, curb and grassy outdoor surfaces at MOD I for improved mobility to enter/exit work   Time 8   Period Weeks   Status On-going     PT LONG TERM GOAL #5   Title See FGA goal below   Status Revised     PT LONG TERM GOAL #6   Title Pt will demonstrate safe lifting techniques (up to 25 lb) from various height surfaces and will carry objects x 150' over level surfaces with improved body mechanics.   Time 8   Period Weeks   Status On-going     PT LONG TERM GOAL #7   Title Will improve FOTO Neuro QOL LE by 20 points   Baseline 40   Time 8   Period Weeks   Status On-going     PT LONG TERM GOAL #8   Title Pt will improve FGA to 20/23 in order to indicate decreased fall risk.     Baseline 16/30 at baseline   Time 8   Period Weeks   Status On-going               Plan - 11/27/16 1046    Clinical Impression Statement Treatment session focused initially on performance of LE stretches bilaterally to decrease mm tension and increase mobility and ROM for low back pain/soreness management.  Performed reassessment of STG with pt meeting 3/5 LTG.  Pt demonstrated  improvement in five times sit to stand but not to goal target indicating continued weakness in LE and imbalance.  Unable to assess gait outside over uneven surfaces; will assess at next visit if time allows.  Pt is making steady progress; will continue to address impairments to meet LTG.   Rehab Potential Good   Clinical Impairments Affecting Rehab Potential recent lumbar surgery-pain and mm tightness   PT Frequency 2x / week   PT Duration 8 weeks   PT Treatment/Interventions ADLs/Self Care Home Management;Cryotherapy;Electrical Stimulation;Moist Heat;Neuromuscular re-education;Balance training;Therapeutic exercise;Therapeutic activities;Functional mobility training;Patient/family education;Gait training;DME Instruction;Stair training;Orthotic Fit/Training;Taping;Passive range of motion   PT Next Visit Plan CHECK VITALS-GOAL BP IS <130/90; finish checking STG-gait outside; continue to focus on LE strengthening and NMR pt can perform at gym: bridges if it doesn't cause back pain, wall squats, lunges, tall and half kneeling, quadruped, physioball for core training, treadmill   Consulted and Agree with Plan of Care Patient      Patient will benefit from skilled therapeutic intervention in order to improve the following deficits and impairments:  Abnormal gait, Decreased balance, Decreased mobility, Decreased strength, Pain, Difficulty walking, Improper body mechanics, Decreased range of  motion  Visit Diagnosis: Hemiplegia and hemiparesis following cerebral infarction affecting right non-dominant side (HCC)  Muscle weakness (generalized)  Other abnormalities of gait and mobility  Unsteadiness on feet  Acute midline low back pain without sciatica     Problem List Patient Active Problem List   Diagnosis Date Noted  . Abnormality of gait as late effect of stroke 08/30/2016  . Hemiparesis affecting dominant side as late effect of cerebrovascular accident (CVA) ([place]) 08/02/2016  . 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 Small vessel disease, cerebrovascular 07/05/2016  . Right sided weakness 07/03/2016  . Facial droop 07/03/2016  . Stroke ([place]) 07/03/2016  . Anemia   . Tachycardia   . Back pain 06/27/2016    Raylene Everts, PT, DPT 11/27/16    10:54 AM    [facility] [address] [place], [place], [address] Phone: [phone]   Fax:  [phone]  Name: Darrell Watson MRN: [id number] Date of Birth: [birth year]

## 2016-11-29 ENCOUNTER — Encounter: Payer: Self-pay | Admitting: Physical Therapy

## 2016-11-29 ENCOUNTER — Ambulatory Visit: Payer: 59 | Admitting: Physical Therapy

## 2016-11-29 VITALS — BP 151/85 | HR 95

## 2016-11-29 DIAGNOSIS — R2681 Unsteadiness on feet: Secondary | ICD-10-CM

## 2016-11-29 DIAGNOSIS — I69353 Hemiplegia and hemiparesis following cerebral infarction affecting right non-dominant side: Secondary | ICD-10-CM | POA: Diagnosis not present

## 2016-11-29 DIAGNOSIS — M545 Low back pain, unspecified: Secondary | ICD-10-CM

## 2016-11-29 DIAGNOSIS — M6281 Muscle weakness (generalized): Secondary | ICD-10-CM

## 2016-11-29 DIAGNOSIS — R2689 Other abnormalities of gait and mobility: Secondary | ICD-10-CM

## 2016-11-29 NOTE — Therapy (Signed)
[facility] [address] [place], [place], [address] Phone: [phone]   Fax:  [phone]  Physical Therapy Treatment  Patient Details  Name: Darrell Watson MRN: [id number] Date of Birth: 03-05-1961 Referring Provider: Charlett Blake, MD  Encounter Date: 11/29/2016      PT End of Session - 11/29/16 0904    Visit Number 9   Number of Visits 17   Date for PT Re-Evaluation 12/20/16   Authorization Type UHC-60 visit limit combined   Authorization - Visit Number 46  [place] and outpatient combined   Authorization - Number of Visits 60   PT Start Time 0800   PT Stop Time 0852   PT Time Calculation (min) 52 min   Activity Tolerance Patient tolerated treatment well   Behavior During Therapy [facility] for tasks assessed/performed      Past Medical History:  Diagnosis Date  . Anemia   . Arthritis   . Stroke ([place])   . Tachycardia     Past Surgical History:  Procedure Laterality Date  . ABDOMINAL EXPOSURE N/A 06/27/2016   Procedure: ABDOMINAL EXPOSURE;  Surgeon: Angelia Mould, MD;  Location: [facility];  Service: Vascular;  Laterality: N/A;  . ANTERIOR LUMBAR FUSION N/A 06/27/2016   Procedure: ANTERIOR LUMBAR FUSION L5-S1;  Surgeon: Melina Schools, MD;  Location: [facility];  Service: Orthopedics;  Laterality: N/A;  Requests 3 hours  . FINGER AMPUTATION Left   . SHOULDER SURGERY      Vitals:   11/29/16 0803  BP: (!) 151/85  Pulse: 95        Subjective Assessment - 11/29/16 0900    Subjective Pt reports low back soreness in the morning when transitioning OOB (2/10)   Pertinent History lumbar fusion 06/27/16, L CVA with R hemiparesis   Limitations Standing;Walking   How long can you stand comfortably? 2-3 hours doing yard work-but LE fatigues quickly   How long can you walk comfortably? at least 10 min   Patient Stated Goals improve RLE strength; reports RUE strength has improved from working out at gym; wants to be able to  get back to work   Currently in Pain? Yes   Pain Score 2    Pain Location Back   Pain Orientation Lower   Pain Type Acute pain                         [facility] Adult PT Treatment/Exercise - 11/29/16 0813      Lumbar Exercises: Stretches   Double Knee to Chest Stretch 5 reps;Limitations   Double Knee to Chest Stretch Limitations after bridge work   Lower Trunk Rotation 5 reps;Limitations   Lower Trunk Rotation Limitations after bridge work     Lumbar Exercises: Supine   Bridge 10 reps;Limitations   Bridge Limitations Bridge on physioball (10x), bridge on physioball with hamstring curl (10), bridge on physioball with alternating single leg raise (5 per leg); PT provided tactile and verbal cues for proper form     Lumbar Exercises: Quadruped   [place]/Old Horse 10 reps;Limitations   Madcat/Old Horse Limitations Done in quadruped and seated; PT provided tactile and verbal cues for proper form     Knee/Hip Exercises: Standing   Lateral Step Up Both;10 reps;Limitations   Lateral Step Up Limitations Step up with knee raise onto aerobic step without risers; chair for balance; PT provided verbal and tactile cues for form   Wall Squat 3 sets;Other (comment)  20 seconds     Knee/Hip Exercises: Seated   Long Arc Quad Strengthening;10 reps;Limitations   Long Arc Quad Limitations Seated on physioball; PT provided tactile and verbal cues for proper form   Other Seated Knee/Hip Exercises Anterior/posterior pelvic tilts on physioball; PT provided tactile and verbal cues for proper form   Marching Limitations Alternating single leg marches on physioball; PT provided tactile and verbal cues for proper form                PT Education - 11/29/16 0902    Education provided Yes   Education Details PT instructed on proper form during all exercises, discussed LE tone and its effects on ambulation, low back stretches to perform   Person(s) Educated Patient   Methods  Explanation;Demonstration;Verbal cues   Comprehension Verbalized understanding;Returned demonstration          PT Short Term Goals - 11/27/16 0813      PT SHORT TERM GOAL #1   Title TARGET DATE FOR ALL STG 11/20/16: Pt will improve functional LE strength by decreasing five times sit to stand time to < or = to 12 seconds   Baseline 14.76 seconds; 12.87 seconds improved but not to target   Time 4   Period Weeks   Status Partially Met     PT SHORT TERM GOAL #2   Title improve BERG balance score to >/= 50/56 for improved balance    Baseline 41/56; 53/56   Time 4   Period Weeks   Status Achieved     PT SHORT TERM GOAL #3   Title improve gait velocity to > 2.25 ft/sec for improved safety during community ambulation   Baseline 2.05 ft/sec, 2.6 ft/sec   Time 4   Period Weeks   Status Achieved     PT SHORT TERM GOAL #4   Title Will negotiate 8 stairs with one rail with alternating sequence and will ambulate outside x 500' over uneven grass and pavement MOD I    Status Partially Met     PT SHORT TERM GOAL #5   Title Participate in FGA assessment with LTG to be set   Baseline 16/30 on 10/31/16   Time 4   Period Weeks   Status Achieved           PT Long Term Goals - 11/27/16 1052      PT LONG TERM GOAL #1   Title TARGET DATE FOR ALL LTG 12/20/16: pt will be independent with HEP and will demonstrate safe technique for performing quad, glute, hamstring, gastroc/DF strengthening at gym and safe use of treadmill (use of machines)   Time 8   Period Weeks   Status On-going     PT LONG TERM GOAL #2   Title improve BERG balance score to >/= 53/56 for decreased fall risk    Baseline 53/56 on 8/1   Time 8   Period Weeks   Status On-going     PT LONG TERM GOAL #3   Title improve gait velocity to >/= 2.62 ft/sec for improved community access    Baseline 2.61 ft/sec on 8/1   Time 8   Period Weeks   Status On-going     PT LONG TERM GOAL #4   Title amb > 1,000 feet on uneven  paved, curb and grassy outdoor surfaces at MOD I for improved mobility to enter/exit work   Time 8   Period Weeks   Status On-going     PT LONG TERM  GOAL #5   Title See FGA goal below   Status Revised     PT LONG TERM GOAL #6   Title Pt will demonstrate safe lifting techniques (up to 25 lb) from various height surfaces and will carry objects x 150' over level surfaces with improved body mechanics.   Time 8   Period Weeks   Status On-going     PT LONG TERM GOAL #7   Title Will improve FOTO Neuro QOL LE by 20 points   Baseline 40   Time 8   Period Weeks   Status On-going     PT LONG TERM GOAL #8   Title Pt will improve FGA to 20/23 in order to indicate decreased fall risk.     Baseline 16/30 at baseline   Time 8   Period Weeks   Status On-going               Plan - 11/29/16 0905    Clinical Impression Statement Treatment session focused on specific exercises that are safe for patient to replicate at the gym, including LE strengthening, core strengthening/stability, and pelvic dissociation exercises to improve mobility, decrease pain and decrease strain on lumbar spine. Pt tolerated treatment well, but reported continued feeling weak in R LE during single leg exercises. Pt stated may need to return to work soon; work simulation exercises should be addressed. Patient needs continued skilled therapy to address deficits and to continue to progress toward stated goals.   Rehab Potential Good   Clinical Impairments Affecting Rehab Potential recent lumbar surgery-pain and mm tightness   PT Frequency 2x / week   PT Duration 8 weeks   PT Treatment/Interventions ADLs/Self Care Home Management;Cryotherapy;Electrical Stimulation;Moist Heat;Neuromuscular re-education;Balance training;Therapeutic exercise;Therapeutic activities;Functional mobility training;Patient/family education;Gait training;DME Instruction;Stair training;Orthotic Fit/Training;Taping;Passive range of motion   PT Next  Visit Plan Check vitals (goal BP 130/90); finish checking STG-gait outside; provide with exercise printouts; sumo squats/leg lifts; work simulation activities (picking up and carrying items)   Consulted and Agree with Plan of Care Patient      Patient will benefit from skilled therapeutic intervention in order to improve the following deficits and impairments:  Abnormal gait, Decreased balance, Decreased mobility, Decreased strength, Pain, Difficulty walking, Improper body mechanics, Decreased range of motion  Visit Diagnosis: Hemiplegia and hemiparesis following cerebral infarction affecting right non-dominant side (HCC)  Muscle weakness (generalized)  Other abnormalities of gait and mobility  Unsteadiness on feet  Acute midline low back pain without sciatica     Problem List Patient Active Problem List   Diagnosis Date Noted  . Abnormality of gait as late effect of stroke 08/30/2016  . Hemiparesis affecting dominant side as late effect of cerebrovascular accident (CVA) ([place]) 08/02/2016  . Disorder of lumbosacral plexus 08/02/2016  . Small vessel disease, cerebrovascular 07/05/2016  . Right sided weakness 07/03/2016  . Facial droop 07/03/2016  . Stroke ([place]) 07/03/2016  . Anemia   . Tachycardia   . Back pain 06/27/2016    Gershon Crane, SPT 11/29/2016, 10:53 AM  [facility] [address] [place], [place], [address] Phone: [phone]   Fax:  [phone]  Name: Darrell Watson MRN: [id number] Date of Birth: [birth year]

## 2016-12-03 ENCOUNTER — Encounter: Payer: Self-pay | Admitting: Physical Therapy

## 2016-12-03 ENCOUNTER — Ambulatory Visit: Payer: 59 | Admitting: Physical Therapy

## 2016-12-03 VITALS — BP 134/98

## 2016-12-03 DIAGNOSIS — M6281 Muscle weakness (generalized): Secondary | ICD-10-CM

## 2016-12-03 DIAGNOSIS — I69353 Hemiplegia and hemiparesis following cerebral infarction affecting right non-dominant side: Secondary | ICD-10-CM

## 2016-12-03 DIAGNOSIS — R2689 Other abnormalities of gait and mobility: Secondary | ICD-10-CM

## 2016-12-03 NOTE — Patient Instructions (Addendum)
   Leg Exercises for the GYM (using gym machines):  ABDUCTION: Sitting - Resistance Band (Active)    Find machine that lets you push legs apart. Keep back flat against support.    Knee Flexion: Hamstring Drop (Eccentric) - Seated (Weight Machine)    Sit on machine. Pull feet back until knees are at 90. Try to primarily use right leg, with left leg "helping" as needed. Slowly release legs for 3-5 seconds. __8-10_ reps per set, __3_ sets per day, __3_ days per week.  http://ecce.exer.us/108   Copyright  VHI. All rights reserved.    ANKLE: Dorsiflexion / Plantarflexion - Sitting     Find the machine where you can sit and work your calves (pushing your toes away). Find a weight where you can tolerate 8-10 reps.  opyright  VHI. All rights reserved.    Copyright  VHI. All rights reserved.

## 2016-12-03 NOTE — Therapy (Signed)
[facility] [address] [place], [place], [address] Phone: [phone]   Fax:  [phone]  Physical Therapy Treatment  Patient Details  Name: Darrell Watson MRN: [id number] Date of Birth: [birth year] Referring Provider: Charlett Blake, MD  Encounter Date: 12/03/2016      PT End of Session - 12/03/16 1431    Visit Number 10   Number of Visits 17   Date for PT Re-Evaluation 12/20/16   Authorization Type UHC-60 visit limit combined   Authorization - Visit Number 35  [place] and outpatient combined   Authorization - Number of Visits 60   PT Start Time 0800   PT Stop Time 2841   PT Time Calculation (min) 44 min   Activity Tolerance Patient tolerated treatment well   Behavior During Therapy [facility] for tasks assessed/performed      Past Medical History:  Diagnosis Date  . Anemia   . Arthritis   . Stroke ([place])   . Tachycardia     Past Surgical History:  Procedure Laterality Date  . ABDOMINAL EXPOSURE N/A 06/27/2016   Procedure: ABDOMINAL EXPOSURE;  Surgeon: Angelia Mould, MD;  Location: [facility];  Service: Vascular;  Laterality: N/A;  . ANTERIOR LUMBAR FUSION N/A 06/27/2016   Procedure: ANTERIOR LUMBAR FUSION L5-S1;  Surgeon: Melina Schools, MD;  Location: [facility];  Service: Orthopedics;  Laterality: N/A;  Requests 3 hours  . FINGER AMPUTATION Left   . SHOULDER SURGERY      Vitals:   12/03/16 0807  BP: (!) 134/98        Subjective Assessment - 12/03/16 0802    Subjective Pt reports low back soreness in the morning when transitioning OOB. If does his stretches, it helps some.    Pertinent History lumbar fusion 06/27/16, L CVA with R hemiparesis   Limitations Standing;Walking   How long can you stand comfortably? 2-3 hours doing yard work-but LE fatigues quickly   How long can you walk comfortably? at least 10 min   Patient Stated Goals improve RLE strength; reports RUE strength has improved from working out at  gym; wants to be able to get back to work   Currently in Pain? Yes   Pain Score 2    Pain Location Back   Pain Orientation Lower   Pain Descriptors / Indicators Aching   Pain Type Acute pain   Pain Onset More than a month ago   Pain Frequency Intermittent                         [facility] Adult PT Treatment/Exercise - 12/03/16 0001      Bed Mobility   Bed Mobility Rolling Left;Rolling Right;Sit to Sidelying Right;Right Sidelying to Sit   Rolling Right 6: Modified independent (Device/Increase time)   Rolling Left 6: Modified independent (Device/Increase time)   Right Sidelying to Sit 5: Supervision   Right Sidelying to Sit Details (indicate cue type and reason) vc for technique as he rolls towards back and then pulls up   Sit to Sidelying Right 6: Modified independent (Device/Increase time)     Transfers   Transfers Sit to Stand;Stand to Sit   Sit to Stand 7: Independent   Stand to Sit 7: Independent     Ambulation/Gait   Ambulation/Gait Yes   Ambulation/Gait Assistance 6: Modified independent (Device/Increase time)   Ambulation/Gait Assistance Details very stiff with little to no Rt knee flexion; improved with cues and practice on  treadmill   Ambulation Distance (Feet) 120 Feet  plus 8 minutes on treadmill   Assistive device None   Gait Pattern Step-through pattern;Decreased step length - right;Decreased stance time - right;Decreased hip/knee flexion - right;Right hip hike;Poor foot clearance - right;Decreased stride length;Lateral trunk lean to left   Ambulation Surface Level;Indoor;Other (comment)  treadmill     Knee/Hip Exercises: Aerobic   Tread Mill 8 minutes at 1.9 mph with instruction in rt heelstrike and rolling over his Rt big toe at push off ( for knee flexion and prevent circumduction)     Knee/Hip Exercises: Standing   Heel Raises Both;Right;Left;2 sets;10 reps;3 seconds   Heel Raises Limitations limited movement unilaterally; better bil   Knee  Flexion Strengthening;Both;3 sets;10 reps  RLE 4# x1 set; then 3# x 2 sets; LLE 4#   Knee Flexion Limitations emphasis on tightening core; pt stabilized pelvis against counter                PT Education - 12/03/16 1429    Education provided Yes   Education Details initiated LE exercise program for patient to do at the gym (see instructions); proper weight to use (can only tolerate 6-10 reps), 3 sets, and every other day at most.    Person(s) Educated Patient   Methods Explanation;Demonstration;Verbal cues;Handout   Comprehension Verbalized understanding;Need further instruction          PT Short Term Goals - 11/27/16 0813      PT SHORT TERM GOAL #1   Title TARGET DATE FOR ALL STG 11/20/16: Pt will improve functional LE strength by decreasing five times sit to stand time to < or = to 12 seconds   Baseline 14.76 seconds; 12.87 seconds improved but not to target   Time 4   Period Weeks   Status Partially Met     PT SHORT TERM GOAL #2   Title improve BERG balance score to >/= 50/56 for improved balance    Baseline 41/56; 53/56   Time 4   Period Weeks   Status Achieved     PT SHORT TERM GOAL #3   Title improve gait velocity to > 2.25 ft/sec for improved safety during community ambulation   Baseline 2.05 ft/sec, 2.6 ft/sec   Time 4   Period Weeks   Status Achieved     PT SHORT TERM GOAL #4   Title Will negotiate 8 stairs with one rail with alternating sequence and will ambulate outside x 500' over uneven grass and pavement MOD I    Status Partially Met     PT SHORT TERM GOAL #5   Title Participate in FGA assessment with LTG to be set   Baseline 16/30 on 10/31/16   Time 4   Period Weeks   Status Achieved           PT Long Term Goals - 11/27/16 1052      PT LONG TERM GOAL #1   Title TARGET DATE FOR ALL LTG 12/20/16: pt will be independent with HEP and will demonstrate safe technique for performing quad, glute, hamstring, gastroc/DF strengthening at gym and safe  use of treadmill (use of machines)   Time 8   Period Weeks   Status On-going     PT LONG TERM GOAL #2   Title improve BERG balance score to >/= 53/56 for decreased fall risk    Baseline 53/56 on 8/1   Time 8   Period Weeks   Status On-going  PT LONG TERM GOAL #3   Title improve gait velocity to >/= 2.62 ft/sec for improved community access    Baseline 2.61 ft/sec on 8/1   Time 8   Period Weeks   Status On-going     PT LONG TERM GOAL #4   Title amb > 1,000 feet on uneven paved, curb and grassy outdoor surfaces at MOD I for improved mobility to enter/exit work   Time 8   Period Weeks   Status On-going     PT LONG TERM GOAL #5   Title See FGA goal below   Status Revised     PT LONG TERM GOAL #6   Title Pt will demonstrate safe lifting techniques (up to 25 lb) from various height surfaces and will carry objects x 150' over level surfaces with improved body mechanics.   Time 8   Period Weeks   Status On-going     PT LONG TERM GOAL #7   Title Will improve FOTO Neuro QOL LE by 20 points   Baseline 40   Time 8   Period Weeks   Status On-going     PT LONG TERM GOAL #8   Title Pt will improve FGA to 20/23 in order to indicate decreased fall risk.     Baseline 16/30 at baseline   Time 8   Period Weeks   Status On-going               Plan - 12/03/16 1432    Clinical Impression Statement Session focused on LE strengthening as pt has weakness bil LEs. Patient requesting exercises he can do at the gym and provided instruction and handout on 3 machines he can safely use (with proper positioning and tightening his core--which he has demonstrated he is very aware and able to do). Patient reports he must be walking better and able to get down on the floor and back up before he could return to work. (We discussed 50# lifting requirement and he states he easily lifts 45# plates at the gym and is not concerned about this aspect). Patient will continue to benefit from PT to  progress towards LTGs.    Rehab Potential Good   Clinical Impairments Affecting Rehab Potential recent lumbar surgery-pain and mm tightness   PT Frequency 2x / week   PT Duration 8 weeks   PT Treatment/Interventions ADLs/Self Care Home Management;Cryotherapy;Electrical Stimulation;Moist Heat;Neuromuscular re-education;Balance training;Therapeutic exercise;Therapeutic activities;Functional mobility training;Patient/family education;Gait training;DME Instruction;Stair training;Orthotic Fit/Training;Taping;Passive range of motion   PT Next Visit Plan Check BP manually (goal BP 130/90); pt wants to work on floor transfers and ambulation distance/tolerance for ? return to work; gait outside; work simulation activities (picking up and carrying items--he's not worried about this)   Consulted and Agree with Plan of Care Patient      Patient will benefit from skilled therapeutic intervention in order to improve the following deficits and impairments:  Abnormal gait, Decreased balance, Decreased mobility, Decreased strength, Pain, Difficulty walking, Improper body mechanics, Decreased range of motion  Visit Diagnosis: Hemiplegia and hemiparesis following cerebral infarction affecting right non-dominant side (HCC)  Muscle weakness (generalized)  Other abnormalities of gait and mobility     Problem List Patient Active Problem List   Diagnosis Date Noted  . Abnormality of gait as late effect of stroke 08/30/2016  . Hemiparesis affecting dominant side as late effect of cerebrovascular accident (CVA) ([place]) 08/02/2016  . Disorder of lumbosacral plexus 08/02/2016  . Small vessel disease, cerebrovascular 07/05/2016  . Right  sided weakness 07/03/2016  . Facial droop 07/03/2016  . Stroke ([place]) 07/03/2016  . Anemia   . Tachycardia   . Back pain 06/27/2016    Darrell Watson, PT 12/03/2016, 2:43 PM  [facility] [address] [place], [place], [address] Phone: [phone]   Fax:  [phone]  Name: Darrell Watson MRN: [id number] Date of Birth: August 18, 1960

## 2016-12-05 ENCOUNTER — Encounter: Payer: Self-pay | Admitting: Physical Therapy

## 2016-12-05 ENCOUNTER — Encounter (HOSPITAL_COMMUNITY): Payer: Self-pay | Admitting: Orthopedic Surgery

## 2016-12-05 ENCOUNTER — Ambulatory Visit: Payer: 59 | Admitting: Physical Therapy

## 2016-12-05 ENCOUNTER — Telehealth: Payer: Self-pay | Admitting: Physical Therapy

## 2016-12-05 VITALS — BP 138/80

## 2016-12-05 DIAGNOSIS — M545 Low back pain, unspecified: Secondary | ICD-10-CM

## 2016-12-05 DIAGNOSIS — I69352 Hemiplegia and hemiparesis following cerebral infarction affecting left dominant side: Secondary | ICD-10-CM

## 2016-12-05 DIAGNOSIS — M6281 Muscle weakness (generalized): Secondary | ICD-10-CM

## 2016-12-05 DIAGNOSIS — R2681 Unsteadiness on feet: Secondary | ICD-10-CM

## 2016-12-05 DIAGNOSIS — I69353 Hemiplegia and hemiparesis following cerebral infarction affecting right non-dominant side: Secondary | ICD-10-CM | POA: Diagnosis not present

## 2016-12-05 DIAGNOSIS — R278 Other lack of coordination: Secondary | ICD-10-CM

## 2016-12-05 DIAGNOSIS — R2689 Other abnormalities of gait and mobility: Secondary | ICD-10-CM

## 2016-12-05 NOTE — Telephone Encounter (Signed)
Hello Dr. Shon BatonBrooks, I am following up on my previous message regarding Darrell Watson and activity restrictions after anterior lumbar fusion.  I did forward your message to Darrell Watson to give your office a call to discuss specific restrictions but I would like to ask for specifics in writing for therapy because Darrell Watson may be returning to work soon and at work he often has to lift items greater than 50lbs.  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    Do you feel it is appropriate for Darrell Watson to begin specific work hardening training and if so, do you have any movement restrictions or weight limit restrictions we should be aware of?  Thank you, Edman CircleAudra Hall, PT, DPT 12/05/16    10:51 AM

## 2016-12-05 NOTE — Therapy (Signed)
[facility] [address] [place], [place], [address] Phone: [phone]   Fax:  [phone]  Physical Therapy Treatment  Patient Details  Name: Darrell Watson MRN: [id number] Date of Birth: 1960-12-09 Referring Provider: Erick Colace, MD  Encounter Date: 12/05/2016      PT End of Session - 12/05/16 0811    Visit Number 11   Number of Visits 17   Date for PT Re-Evaluation 12/20/16   Authorization Type UHC-60 visit limit combined   Authorization - Visit Number 29  HH and outpatient combined   Authorization - Number of Visits 60   PT Start Time 0802   PT Stop Time 0850   PT Time Calculation (min) 48 min   Activity Tolerance Patient tolerated treatment well   Behavior During Therapy [facility] for tasks assessed/performed      Past Medical History:  Diagnosis Date  . Anemia   . Arthritis   . Stroke (HCC)   . Tachycardia     Past Surgical History:  Procedure Laterality Date  . ABDOMINAL EXPOSURE N/A 06/27/2016   Procedure: ABDOMINAL EXPOSURE;  Surgeon: Chuck Hint, MD;  Location: [facility] OR;  Service: Vascular;  Laterality: N/A;  . ANTERIOR LUMBAR FUSION N/A 06/27/2016   Procedure: ANTERIOR LUMBAR FUSION L5-S1;  Surgeon: Venita Lick, MD;  Location: [facility] OR;  Service: Orthopedics;  Laterality: N/A;  Requests 3 hours  . FINGER AMPUTATION Left   . SHOULDER SURGERY      Vitals:   12/05/16 0808  BP: 138/80        Subjective Assessment - 12/05/16 0808    Subjective Pt reports still having low back soreness in the morning. Is thinking about having to get back to work soon.   Pertinent History lumbar fusion 06/27/16, L CVA with R hemiparesis   Limitations Standing;Walking   How long can you stand comfortably? 2-3 hours doing yard work-but LE fatigues quickly   How long can you walk comfortably? at least 10 min   Patient Stated Goals improve RLE strength; reports RUE strength has improved from working out at gym;  wants to be able to get back to work   Currently in Pain? Yes   Pain Score 2    Pain Location Back   Pain Orientation Lower   Pain Descriptors / Indicators Sore   Pain Type Acute pain   Pain Onset More than a month ago   Aggravating Factors  worse in the morning   Pain Relieving Factors stretches, moving around for a few hours                         [facility] Adult PT Treatment/Exercise - 12/05/16 0821      Transfers   Transfers Floor to Transfer   Floor to Transfer With upper extremity assist;From chair/3-in-1  Performed to L and R, pushes with UE on chair bottom   Number of Reps Other reps (comment)  5 reps each way   Transfer Cueing PT provided demo, verbal instruction to perform safely and protect back. Patient returned demo with no cueing needed     Ambulation/Gait   Ambulation/Gait Yes   Ambulation/Gait Assistance 5: Supervision   Ambulation/Gait Assistance Details stiff with wide BOS and minimal R knee flexion and unequal step length. Improved with visual, tactile and verbal cuing using Gait Trainer   Ambulation Distance (Feet) 75 Feet   Assistive device Other (Comment)  plus 6 minutes on  Gait Trainer   Gait Pattern Step-through pattern;Decreased step length - right;Decreased stance time - right;Decreased hip/knee flexion - right   Ambulation Surface Level;Indoor;Other (comment)  Treadmill     Balance Poses: Yoga   Warrior I 1 rep;30 seconds  each side     Knee/Hip Exercises: Aerobic   Tread Mill 6 min at 2.0 mph with Administrator, arts program with demographics within age norms except speed.                 PT Education - 12/05/16 0810    Education provided Yes   Education Details Patient provided with HEP stretches and pelvic dissociation exercises   Person(s) Educated Patient   Methods Explanation;Handout   Comprehension Verbalized understanding          PT Short Term Goals - 11/27/16 0813      PT SHORT TERM GOAL #1   Title TARGET DATE  FOR ALL STG 11/20/16: Pt will improve functional LE strength by decreasing five times sit to stand time to < or = to 12 seconds   Baseline 14.76 seconds; 12.87 seconds improved but not to target   Time 4   Period Weeks   Status Partially Met     PT SHORT TERM GOAL #2   Title improve BERG balance score to >/= 50/56 for improved balance    Baseline 41/56; 53/56   Time 4   Period Weeks   Status Achieved     PT SHORT TERM GOAL #3   Title improve gait velocity to > 2.25 ft/sec for improved safety during community ambulation   Baseline 2.05 ft/sec, 2.6 ft/sec   Time 4   Period Weeks   Status Achieved     PT SHORT TERM GOAL #4   Title Will negotiate 8 stairs with one rail with alternating sequence and will ambulate outside x 500' over uneven grass and pavement MOD I    Status Partially Met     PT SHORT TERM GOAL #5   Title Participate in FGA assessment with LTG to be set   Baseline 16/30 on 10/31/16   Time 4   Period Weeks   Status Achieved           PT Long Term Goals - 11/27/16 1052      PT LONG TERM GOAL #1   Title TARGET DATE FOR ALL LTG 12/20/16: pt will be independent with HEP and will demonstrate safe technique for performing quad, glute, hamstring, gastroc/DF strengthening at gym and safe use of treadmill (use of machines)   Time 8   Period Weeks   Status On-going     PT LONG TERM GOAL #2   Title improve BERG balance score to >/= 53/56 for decreased fall risk    Baseline 53/56 on 8/1   Time 8   Period Weeks   Status On-going     PT LONG TERM GOAL #3   Title improve gait velocity to >/= 2.62 ft/sec for improved community access    Baseline 2.61 ft/sec on 8/1   Time 8   Period Weeks   Status On-going     PT LONG TERM GOAL #4   Title amb > 1,000 feet on uneven paved, curb and grassy outdoor surfaces at MOD I for improved mobility to enter/exit work   Time 8   Period Weeks   Status On-going     PT LONG TERM GOAL #5   Title See FGA goal below   Status Revised  PT LONG TERM GOAL #6   Title Pt will demonstrate safe lifting techniques (up to 25 lb) from various height surfaces and will carry objects x 150' over level surfaces with improved body mechanics.   Time 8   Period Weeks   Status On-going     PT LONG TERM GOAL #7   Title Will improve FOTO Neuro QOL LE by 20 points   Baseline 40   Time 8   Period Weeks   Status On-going     PT LONG TERM GOAL #8   Title Pt will improve FGA to 20/23 in order to indicate decreased fall risk.     Baseline 16/30 at baseline   Time 8   Period Weeks   Status On-going               Plan - 12/05/16 1337    Clinical Impression Statement Today's session focused on floor transfers using chair and UE for support, stretches to improve hip extension and chest expansion, and gait training on the treadmill to improve step length and increase gait speed. Pt continues to exhibit deficits in gait and mobility and should benefiit from continued skilled therapy to progress toward goals.   Rehab Potential Good   Clinical Impairments Affecting Rehab Potential recent lumbar surgery-pain and mm tightness   PT Frequency 2x / week   PT Duration 8 weeks   PT Treatment/Interventions ADLs/Self Care Home Management;Cryotherapy;Electrical Stimulation;Moist Heat;Neuromuscular re-education;Balance training;Therapeutic exercise;Therapeutic activities;Functional mobility training;Patient/family education;Gait training;DME Instruction;Stair training;Orthotic Fit/Training;Taping;Passive range of motion   PT Next Visit Plan Check BP manually (goal BP 130/90); instruct in Yoga stretches as option for morning routine to address stiffness. 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 Abnormality of gait as late effect of stroke 08/30/2016  . Hemiparesis affecting dominant side as late effect of cerebrovascular accident (CVA) ([place]) 08/02/2016  . Disorder of lumbosacral plexus 08/02/2016  . Small vessel disease, cerebrovascular 07/05/2016  . Right sided weakness 07/03/2016  . Facial droop 07/03/2016  . Stroke ([place]) 07/03/2016  . Anemia   . Tachycardia   . Back pain 06/27/2016   Gershon Crane, SPT 12/05/2016, 1:44 PM  Jamey Reas, PT, DPT 12/05/2016, 11:19 PM  [facility] [address] [place], [place], [address] Phone: [phone]   Fax:  [phone]  Name: Darrell Watson MRN: [id number] Date of Birth: Jul 16, 1960

## 2016-12-05 NOTE — Telephone Encounter (Signed)
I am ok with increasing to 60 lbs.  However no sure if from the stroke stand point if there are wright restrictions

## 2016-12-06 ENCOUNTER — Telehealth: Payer: Self-pay | Admitting: Physical Medicine & Rehabilitation

## 2016-12-06 ENCOUNTER — Ambulatory Visit (INDEPENDENT_AMBULATORY_CARE_PROVIDER_SITE_OTHER): Payer: 59 | Admitting: Neurology

## 2016-12-06 DIAGNOSIS — Z8673 Personal history of transient ischemic attack (TIA), and cerebral infarction without residual deficits: Secondary | ICD-10-CM

## 2016-12-06 DIAGNOSIS — G4720 Circadian rhythm sleep disorder, unspecified type: Secondary | ICD-10-CM

## 2016-12-06 DIAGNOSIS — R0683 Snoring: Secondary | ICD-10-CM

## 2016-12-06 DIAGNOSIS — G4761 Periodic limb movement disorder: Secondary | ICD-10-CM

## 2016-12-06 DIAGNOSIS — G4733 Obstructive sleep apnea (adult) (pediatric): Secondary | ICD-10-CM | POA: Diagnosis not present

## 2016-12-06 NOTE — Telephone Encounter (Signed)
Dr Riley KillSwartz - NCM for [facility] needs 3 more specific questions asked from the disability forms filled out based on the office notes.  Is he 100% disabled - can you give her a more objective opinion of the physical restructions - I.e. They are trying to determine if they can give him something with different or lighter duties  - and if not how long  I put the 3 question form in your box. L

## 2016-12-09 ENCOUNTER — Encounter: Payer: Self-pay | Admitting: Physical Therapy

## 2016-12-09 ENCOUNTER — Ambulatory Visit: Payer: 59 | Admitting: Physical Therapy

## 2016-12-09 VITALS — BP 128/85

## 2016-12-09 DIAGNOSIS — M6281 Muscle weakness (generalized): Secondary | ICD-10-CM

## 2016-12-09 DIAGNOSIS — M545 Low back pain, unspecified: Secondary | ICD-10-CM

## 2016-12-09 DIAGNOSIS — I69353 Hemiplegia and hemiparesis following cerebral infarction affecting right non-dominant side: Secondary | ICD-10-CM

## 2016-12-09 DIAGNOSIS — R2689 Other abnormalities of gait and mobility: Secondary | ICD-10-CM

## 2016-12-09 DIAGNOSIS — R2681 Unsteadiness on feet: Secondary | ICD-10-CM

## 2016-12-09 NOTE — Therapy (Signed)
[facility] [address] [place], [place], [address] Phone: [phone]   Fax:  [phone]  Physical Therapy Treatment  Patient Details  Name: Darrell Watson MRN: [id number] Date of Birth: 05/09/1960 Referring Provider: Charlett Blake, MD  Encounter Date: 12/09/2016      PT End of Session - 12/09/16 0857    Visit Number 12   Number of Visits 17   Date for PT Re-Evaluation 12/20/16   Authorization Type UHC-60 visit limit combined   Authorization - Visit Number 80  [place] and outpatient combined   Authorization - Number of Visits 60   PT Start Time 0800   PT Stop Time 0845   PT Time Calculation (min) 45 min   Activity Tolerance Patient tolerated treatment well   Behavior During Therapy [facility] for tasks assessed/performed      Past Medical History:  Diagnosis Date  . Anemia   . Arthritis   . Stroke ([place])   . Tachycardia     Past Surgical History:  Procedure Laterality Date  . ABDOMINAL EXPOSURE N/A 06/27/2016   Procedure: ABDOMINAL EXPOSURE;  Surgeon: Angelia Mould, MD;  Location: [place];  Service: Vascular;  Laterality: N/A;  . ANTERIOR LUMBAR FUSION N/A 06/27/2016   Procedure: ANTERIOR LUMBAR FUSION L5-S1;  Surgeon: Melina Schools, MD;  Location: [place];  Service: Orthopedics;  Laterality: N/A;  Requests 3 hours  . FINGER AMPUTATION Left   . SHOULDER SURGERY      Vitals:   12/09/16 0806  BP: 128/85        Subjective Assessment - 12/09/16 0809    Subjective Has started doing the treadmill at the gym; went well. No soreness after PT last week but was fatigued.   Pertinent History lumbar fusion 06/27/16, L CVA with R hemiparesis   Limitations Standing;Walking   How long can you stand comfortably? 2-3 hours doing yard work-but LE fatigues quickly   How long can you walk comfortably? at least 10 min   Patient Stated Goals improve RLE strength; reports RUE strength has improved from working out at gym;  wants to be able to get back to work   Currently in Pain? Yes   Pain Score 1    Pain Location Back   Pain Orientation Lower   Pain Descriptors / Indicators Sore   Pain Type Chronic pain                         [facility] Adult PT Treatment/Exercise - 12/09/16 0842      Transfers   Transfers Floor to Transfer   Floor to Transfer With upper extremity assist;5: Supervision   Floor to Transfer Details (indicate cue type and reason) From tall kneeling> half kneeling to stand     Therapeutic Activites    Therapeutic Activities Lifting   Lifting Performed lifting crate with 10lbs with focus on proper lifting form with squatting, stepping backwards and pivoting with feet and keeping load close to body while performing lifting and lowering to various height surfaces with supervision      Neuro Re-ed    Neuro Re-ed Details  NMR for trunk/pelvis dissociation, core activation and proximal stability and balance training during modified yoga poses beginning in quadruped performing cat/cow > child's pose with elbow flexion due to shoulder flexion limitations > unilateral UE flexion/reaching x 5, unilateral LE extension>flexion x 5 > alternating, contralateral UE and LE flexion/extension x 5 reps > modified thread  the needle with child's pose in between sets to rest low back.  In tall kneeling with UE support on mat performed 2 reps each side transition from tall kneeling > half kneeling > low lunge > warrior 2 with supervision-min A for balance.  With bilat UE on mat performed modified down dog for stretch of low back and hamstrings x 2 reps.                PT Education - 12/09/16 0856    Education provided Yes   Education Details yoga sequence and proper lifting technique   Person(s) Educated Patient   Methods Explanation;Demonstration   Comprehension Verbalized understanding;Returned demonstration          PT Short Term Goals - 11/27/16 0813      PT SHORT TERM GOAL #1    Title TARGET DATE FOR ALL STG 11/20/16: Pt will improve functional LE strength by decreasing five times sit to stand time to < or = to 12 seconds   Baseline 14.76 seconds; 12.87 seconds improved but not to target   Time 4   Period Weeks   Status Partially Met     PT SHORT TERM GOAL #2   Title improve BERG balance score to >/= 50/56 for improved balance    Baseline 41/56; 53/56   Time 4   Period Weeks   Status Achieved     PT SHORT TERM GOAL #3   Title improve gait velocity to > 2.25 ft/sec for improved safety during community ambulation   Baseline 2.05 ft/sec, 2.6 ft/sec   Time 4   Period Weeks   Status Achieved     PT SHORT TERM GOAL #4   Title Will negotiate 8 stairs with one rail with alternating sequence and will ambulate outside x 500' over uneven grass and pavement MOD I    Status Partially Met     PT SHORT TERM GOAL #5   Title Participate in FGA assessment with LTG to be set   Baseline 16/30 on 10/31/16   Time 4   Period Weeks   Status Achieved           PT Long Term Goals - 11/27/16 1052      PT LONG TERM GOAL #1   Title TARGET DATE FOR ALL LTG 12/20/16: pt will be independent with HEP and will demonstrate safe technique for performing quad, glute, hamstring, gastroc/DF strengthening at gym and safe use of treadmill (use of machines)   Time 8   Period Weeks   Status On-going     PT LONG TERM GOAL #2   Title improve BERG balance score to >/= 53/56 for decreased fall risk    Baseline 53/56 on 8/1   Time 8   Period Weeks   Status On-going     PT LONG TERM GOAL #3   Title improve gait velocity to >/= 2.62 ft/sec for improved community access    Baseline 2.61 ft/sec on 8/1   Time 8   Period Weeks   Status On-going     PT LONG TERM GOAL #4   Title amb > 1,000 feet on uneven paved, curb and grassy outdoor surfaces at MOD I for improved mobility to enter/exit work   Time 8   Period Weeks   Status On-going     PT LONG TERM GOAL #5   Title See FGA goal below    Status Revised     PT LONG TERM GOAL #6   Title  Pt will demonstrate safe lifting techniques (up to 25 lb) from various height surfaces and will carry objects x 150' over level surfaces with improved body mechanics.   Time 8   Period Weeks   Status On-going     PT LONG TERM GOAL #7   Title Will improve FOTO Neuro QOL LE by 20 points   Baseline 40   Time 8   Period Weeks   Status On-going     PT LONG TERM GOAL #8   Title Pt will improve FGA to 20/23 in order to indicate decreased fall risk.     Baseline 16/30 at baseline   Time 8   Period Weeks   Status On-going               Plan - 12/09/16 9753    Clinical Impression Statement Continued to focus on modified yoga sequence for core activation/proximal stability training, chest expansion, LE NMR/strengthening, balance and training for floor <> stand transfer.  Continued to discuss work requirements and review safe lifting technique.  Will continue to address.   Rehab Potential Good   Clinical Impairments Affecting Rehab Potential recent lumbar surgery-pain and mm tightness   PT Frequency 2x / week   PT Duration 8 weeks   PT Treatment/Interventions ADLs/Self Care Home Management;Cryotherapy;Electrical Stimulation;Moist Heat;Neuromuscular re-education;Balance training;Therapeutic exercise;Therapeutic activities;Functional mobility training;Patient/family education;Gait training;DME Instruction;Stair training;Orthotic Fit/Training;Taping;Passive range of motion   PT Next Visit Plan Check BP manually with XL cuff(goal BP 130/90); provide yoga sequence handout. continue to work on floor transfers and ambulation distance/tolerance on a variety of surfaces, lifting technique progressing amount of weight carrying, up/down ladder?   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 Abnormality of gait as late effect of stroke 08/30/2016  . Hemiparesis affecting dominant side as late effect of cerebrovascular accident (CVA) ([place]) 08/02/2016  . Disorder of lumbosacral plexus 08/02/2016  . Small vessel disease, cerebrovascular 07/05/2016  . Right sided weakness 07/03/2016  . Facial droop 07/03/2016  . Stroke ([place]) 07/03/2016  . Anemia   . Tachycardia   . Back pain 06/27/2016    Raylene Everts, PT, DPT 12/09/16    9:07 AM    [facility] [address] [place], [place], [address] Phone: [phone]   Fax:  [phone]  Name: ABHIRAM CRIADO MRN: [id number] Date of Birth: [birth year]

## 2016-12-09 NOTE — Telephone Encounter (Signed)
Dr. Kirsteins patient 

## 2016-12-10 ENCOUNTER — Telehealth: Payer: Self-pay

## 2016-12-10 NOTE — Progress Notes (Signed)
Patient referred by Dr. Marjory LiesPenumalli, seen by me on 11/04/16, diagnostic PSG on 12/06/16.   Please call and notify the patient that the recent sleep study did not show any significant obstructive sleep apnea and only mild snoring. Sleep was fragmented. Oxygen above 90% all night, which is good mild leg movements wo significant sleep disturbance noted.  Please inform patient that he can FU with Dr. Marjory LiesPenumalli as planned.   Please remind patient to try to maintain good sleep hygiene, which means: Keep a regular sleep and wake schedule and make enough time for sleep (7 1/2 to 8 1/2 hours for the average adult), try not to exercise or have a meal within 2 hours of your bedtime, try to keep your bedroom conducive for sleep, that is, cool and dark, without light distractors such as an illuminated alarm clock, and refrain from watching TV right before sleep or in the middle of the night and do not keep the TV or radio on during the night. If a nightlight is used, have it away from the visual field. Also, try not to use or play on electronic devices at bedtime, such as your cell phone, tablet PC or laptop. If you like to read at bedtime on an electronic device, try to dim the background light as much as possible. Do not eat in the middle of the night. Keep pets away from the bedroom environment. For stress relief, try meditation, deep breathing exercises (there are many books and CDs available), a white noise machine or fan can help to diffuse other noise distractors, such as traffic noise. Do not drink alcohol before bedtime, as it can disturb sleep and cause middle of the night awakenings. Never mix alcohol and sedating medications! Avoid narcotic pain medication close to bedtime, as opioids/narcotics can suppress breathing drive and breathing effort.    Thanks,  Huston FoleySaima Ayren Zumbro, MD, PhD [facility])

## 2016-12-10 NOTE — Telephone Encounter (Signed)
I called pt. I advised pt that Dr. Frances FurbishAthar reviewed pt's sleep study and found that did not show any significant osa during his sleep study. Pt had mild snoring, sleep fragmentation, and mild leg movements that were not disturbing. Pt's oxygen was above 90% all night. Dr. Frances FurbishAthar recommends that pt follow up with Dr. Marjory LiesPenumalli and Eber Jonesarolyn, NP as planned. I reviewed sleep hygiene recommendations with the pt, including trying to keep a regular sleep wake schedule, avoiding electronics in the bedroom, keeping the bedroom cool, dark, and quiet, and avoiding eating or exercising within 2 hours of bedtime as well as eating in the middle of the night. I advised pt to keep pets away from the bedtime. I discussed with pt the importance of stress relief and to try meditation, deep breathing exercises, and/or a white noise machine or fan to diffuse other noise distracters. I advised pt to not drink alcohol before bedtime and to never mix alcohol and sedating medications. Pt was advised to avoid narcotic pain medication close to bedtime. Pt verbalized understanding of results. Pt had no questions at this time but was encouraged to call back if questions arise.

## 2016-12-10 NOTE — Telephone Encounter (Signed)
-----   Message from Huston FoleySaima Athar, MD sent at 12/10/2016  9:05 AM EDT ----- Patient referred by Dr. Marjory LiesPenumalli, seen by me on 11/04/16, diagnostic PSG on 12/06/16.   Please call and notify the patient that the recent sleep study did not show any significant obstructive sleep apnea and only mild snoring. Sleep was fragmented. Oxygen above 90% all night, which is good mild leg movements wo significant sleep disturbance noted.  Please inform patient that he can FU with Dr. Marjory LiesPenumalli as planned.   Please remind patient to try to maintain good sleep hygiene, which means: Keep a regular sleep and wake schedule and make enough time for sleep (7 1/2 to 8 1/2 hours for the average adult), try not to exercise or have a meal within 2 hours of your bedtime, try to keep your bedroom conducive for sleep, that is, cool and dark, without light distractors such as an illuminated alarm clock, and refrain from watching TV right before sleep or in the middle of the night and do not keep the TV or radio on during the night. If a nightlight is used, have it away from the visual field. Also, try not to use or play on electronic devices at bedtime, such as your cell phone, tablet PC or laptop. If you like to read at bedtime on an electronic device, try to dim the background light as much as possible. Do not eat in the middle of the night. Keep pets away from the bedroom environment. For stress relief, try meditation, deep breathing exercises (there are many books and CDs available), a white noise machine or fan can help to diffuse other noise distractors, such as traffic noise. Do not drink alcohol before bedtime, as it can disturb sleep and cause middle of the night awakenings. Never mix alcohol and sedating medications! Avoid narcotic pain medication close to bedtime, as opioids/narcotics can suppress breathing drive and breathing effort.    Thanks,  Huston FoleySaima Athar, MD, PhD [facility])

## 2016-12-10 NOTE — Procedures (Signed)
PATIENT'S NAME:  Darrell Watson, Tevion DOB:      01-10-1961      MR#:    [id number]     DATE OF RECORDING: 12/06/2016 REFERRING M.D.: Joycelyn SchmidVikram Penumalli, MD,      PCP: Renford Dillsonald Polite, MD Study Performed:   Baseline Polysomnogram HISTORY: 56 year old man with a history of stroke in March 2018, anemia, arthritis, status post shoulder surgery, status post lumbar spine surgery, status post left finger amputation, and overweight state who reports snoring and excessive daytime somnolence. The patient endorsed the Epworth Sleepiness Scale at 6 points. The patient's weight 194 pounds with a height of 70 (inches), resulting in a BMI of 27.8 kg/m2. The patient's neck circumference measured 16.5 inches.  CURRENT MEDICATIONS: Tylenol, Aspirin, Testosterone   PROCEDURE:  This is a multichannel digital polysomnogram utilizing the Somnostar 11.2 system.  Electrodes and sensors were applied and monitored per AASM Specifications.   EEG, EOG, Chin and Limb EMG, were sampled at 200 Hz.  ECG, Snore and Nasal Pressure, Thermal Airflow, Respiratory Effort, CPAP Flow and Pressure, Oximetry was sampled at 50 Hz. Digital video and audio were recorded.      BASELINE STUDY  Lights Out was at 22:09 and Lights On at 05:05.  Total recording time (TRT) was 416.5 minutes, with a total sleep time (TST) of  308.5 minutes.   The patient's sleep latency was 18 minutes.  REM latency was 53.5 minutes.  The sleep efficiency was 74.1 %, which is reduced.     SLEEP ARCHITECTURE: WASO (Wake after sleep onset) was 89 minutes with moderate sleep fragmentation noted.  There were 25 minutes in Stage N1, 213 minutes Stage N2, 10 minutes Stage N3 and 60.5 minutes in Stage REM.  The percentage of Stage N1 was 8.1%, which is increased, Stage N2 was 69.%, which is increased, Stage N3 was 3.2% and Stage R (REM sleep) was 19.6%, which is normal. The arousals were noted as: 50 were spontaneous, 29 were associated with PLMs, 0 were associated with respiratory  events.    Audio and video analysis did not show any abnormal or unusual movements, behaviors, phonations or vocalizations. The patient took 1 bathroom break. Mild intermittent snoring was noted.  EKG was in keeping with normal sinus rhythm (NSR).  RESPIRATORY ANALYSIS:  There were a total of 0 respiratory events:  0 obstructive apneas, 0 central apneas and 0 mixed apneas with a total of 0 apneas and an apnea index (AI) of 0 /hour. There were 0 hypopneas with a hypopnea index of 0 /hour. The patient also had 0 respiratory event related arousals (RERAs).      The total APNEA/HYPOPNEA INDEX (AHI) was 0/hour and the total RESPIRATORY DISTURBANCE INDEX was 0 /hour.  0 events occurred in REM sleep and 0 events in NREM. The REM AHI was 0 /hour, versus a non-REM AHI of 0. The patient spent 153.5 minutes of total sleep time in the supine position and 155 minutes in non-supine.. The supine AHI was 0.0 versus a non-supine AHI of 0.0.  OXYGEN SATURATION & C02:  The Wake baseline 02 saturation was 98%, with the lowest being 92%. Time spent below 89% saturation equaled 0 minutes.  PERIODIC LIMB MOVEMENTS: The patient had a total of 107 Periodic Limb Movements.  The Periodic Limb Movement (PLM) index was 20.8 and the PLM Arousal index was 5.6/hour.  Post-study, the patient indicated that sleep was worse than usual.   IMPRESSION:  1. Periodic Limb Movement Disorder (PLMD) 2. Primary Snoring 3.  Dysfunctions associated with sleep stages or arousal from sleep  RECOMMENDATIONS:  1. This study does not demonstrate any significant obstructive or central sleep disordered breathing and showed only mild, intermittent snoring. CPAP therapy is not warranted. For disturbing snoring, an oral appliance (through a qualified dentist) can be considered.  2. Mild PLMs (periodic limb movements of sleep) were noted during this study with minimal/very mild arousals; clinical correlation is recommended. Since the patient does not  endorse RLS symptoms and his PLM arousal index was low, he can be monitored.  3. This study shows sleep fragmentation and abnormal sleep stage percentages; these are nonspecific findings and per se do not signify an intrinsic sleep disorder or a cause for the patient's sleep-related symptoms. Causes include (but are not limited to) the first night effect of the sleep study, circadian rhythm disturbances, medication effect or an underlying mood disorder or medical problem.  4. The patient should be cautioned not to drive, work at heights, or operate dangerous or heavy equipment when tired or sleepy. Review and reiteration of good sleep hygiene measures should be pursued with any patient. 5. The patient can follow-up with his referring provider, who will be notified of the test results.  I certify that I have reviewed the entire raw data recording prior to the issuance of this report in accordance with the Standards of Accreditation of the American Academy of Sleep Medicine (AASM)    Huston Foley, MD, PhD Diplomat, American Board of Psychiatry and Neurology (Neurology and Sleep Medicine)

## 2016-12-11 NOTE — Telephone Encounter (Signed)
I will look at the form when I get back to the office tomorrow

## 2016-12-12 ENCOUNTER — Ambulatory Visit: Payer: 59 | Admitting: Physical Therapy

## 2016-12-12 VITALS — BP 142/96 | HR 108

## 2016-12-12 DIAGNOSIS — I69353 Hemiplegia and hemiparesis following cerebral infarction affecting right non-dominant side: Secondary | ICD-10-CM | POA: Diagnosis not present

## 2016-12-12 DIAGNOSIS — M545 Low back pain, unspecified: Secondary | ICD-10-CM

## 2016-12-12 DIAGNOSIS — R2689 Other abnormalities of gait and mobility: Secondary | ICD-10-CM

## 2016-12-12 DIAGNOSIS — M6281 Muscle weakness (generalized): Secondary | ICD-10-CM

## 2016-12-12 DIAGNOSIS — R2681 Unsteadiness on feet: Secondary | ICD-10-CM

## 2016-12-12 NOTE — Therapy (Signed)
[facility] [address] [place], [place], [address] Phone: [phone]   Fax:  [phone]  Physical Therapy Treatment  Patient Details  Name: Darrell Watson MRN: [id number] Date of Birth: 29-Feb-1961 Referring Provider: Charlett Blake, MD  Encounter Date: 12/12/2016      PT End of Session - 12/12/16 0805    Visit Number 13   Number of Visits 17   Date for PT Re-Evaluation 12/20/16   Authorization Type [id number] visit limit combined   Authorization - Visit Number 46  [place] and outpatient combined   Authorization - Number of Visits 60   PT Start Time 0800   PT Stop Time 0846   PT Time Calculation (min) 46 min   Activity Tolerance Patient tolerated treatment well   Behavior During Therapy [facility] for tasks assessed/performed      Past Medical History:  Diagnosis Date  . Anemia   . Arthritis   . Stroke ([place])   . Tachycardia     Past Surgical History:  Procedure Laterality Date  . ABDOMINAL EXPOSURE N/A 06/27/2016   Procedure: ABDOMINAL EXPOSURE;  Surgeon: Angelia Mould, MD;  Location: [facility];  Service: Vascular;  Laterality: N/A;  . ANTERIOR LUMBAR FUSION N/A 06/27/2016   Procedure: ANTERIOR LUMBAR FUSION L5-S1;  Surgeon: Melina Schools, MD;  Location: [facility];  Service: Orthopedics;  Laterality: N/A;  Requests 3 hours  . FINGER AMPUTATION Left   . SHOULDER SURGERY      Vitals:   12/12/16 0805  BP: (!) 142/96  Pulse: (!) 108  SpO2: 98%        Subjective Assessment - 12/12/16 0805    Subjective Pt reports back is sore today, but says he hopped right out of bed and didn't do his stretches. Says when he has time to stretch it feels better.   Pertinent History lumbar fusion 06/27/16, L CVA with R hemiparesis   Limitations Standing;Walking   Patient Stated Goals improve RLE strength; reports RUE strength has improved from working out at gym; wants to be able to get back to work   Currently in Pain? 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 PT provided minA to prevent LOB/for safety due to fatigue.     Neuro Re-ed    Neuro Re-ed Details  NMR and balance training during modified yoga poses beginning in quadruped performing cat/cow x 5 reps > child's pose with elbow flexion due to shoulder flexion limitations > alternating contralateral UE and LE flexion/extension x 5 reps >  modified thread the needle x 3 reps.  In tall kneeling with UE support on mat performed 3 reps each side transition from tall kneeling > half kneeling > low lunge > warrior 2 with supervision-min A for balance.  With bilat UE on mat performed modified down dog for stretch of low back and hamstrings x 3 reps.     Lumbar Exercises: Supine   Bridge 5 reps   Bridge Limitations Bridging from mat. PT provided verbal cues for form, muscle activation, reminders not to hold breath                PT Education - 12/12/16 0810    Education provided Yes   Education Details Reviewed yoga sequence and HEP; provided demo and instructions to ascend/descend A-frame ladder while turning leading foot out to keep body close to ladder for safety   Person(s) Educated Patient   Methods  Explanation;Demonstration;Tactile cues;Verbal cues   Comprehension Verbalized understanding;Returned demonstration;Tactile cues required;Need further instruction          PT Short Term Goals - 11/27/16 0813      PT SHORT TERM GOAL #1   Title TARGET DATE FOR ALL STG 11/20/16: Pt will improve functional LE strength by decreasing five times sit to stand time to < or = to 12 seconds   Baseline 14.76 seconds; 12.87 seconds improved but not to target   Time 4   Period Weeks   Status Partially Met     PT SHORT TERM GOAL #2   Title improve BERG balance score to >/= 50/56 for improved balance    Baseline 41/56; 53/56   Time 4   Period Weeks   Status Achieved     PT SHORT TERM GOAL #3   Title improve gait velocity to > 2.25 ft/sec for improved safety during community ambulation   Baseline 2.05 ft/sec, 2.6 ft/sec   Time 4   Period Weeks   Status Achieved     PT SHORT TERM GOAL #4   Title Will negotiate 8 stairs with one rail with alternating sequence and will ambulate outside x 500' over uneven grass and pavement MOD I    Status Partially Met     PT SHORT TERM GOAL #5   Title Participate in FGA assessment with LTG to be set   Baseline 16/30 on 10/31/16   Time 4   Period Weeks   Status Achieved           PT Long Term Goals - 11/27/16 1052      PT LONG TERM GOAL #1   Title TARGET DATE FOR ALL LTG 12/20/16: pt will be independent with HEP and will demonstrate safe technique for performing quad, glute, hamstring, gastroc/DF strengthening at gym and safe use of treadmill (use of machines)   Time 8   Period Weeks   Status On-going     PT LONG TERM GOAL #2   Title improve BERG balance score to >/= 53/56 for decreased fall risk    Baseline 53/56 on 8/1   Time 8   Period Weeks   Status On-going     PT LONG TERM GOAL #3   Title improve gait velocity to >/= 2.62 ft/sec for improved community access    Baseline 2.61 ft/sec on 8/1   Time 8   Period Weeks   Status On-going      PT LONG TERM GOAL #4   Title amb > 1,000 feet on uneven paved, curb and grassy outdoor surfaces  at MOD I for improved mobility to enter/exit work   Time 8   Period Weeks   Status On-going     PT LONG TERM GOAL #5   Title See FGA goal below   Status Revised     PT LONG TERM GOAL #6   Title Pt will demonstrate safe lifting techniques (up to 25 lb) from various height surfaces and will carry objects x 150' over level surfaces with improved body mechanics.   Time 8   Period Weeks   Status On-going     PT LONG TERM GOAL #7   Title Will improve FOTO Neuro QOL LE by 20 points   Baseline 40   Time 8   Period Weeks   Status On-going     PT LONG TERM GOAL #8   Title Pt will improve FGA to 20/23 in order to indicate decreased fall risk.     Baseline 16/30 at baseline   Time 8   Period Weeks   Status On-going               Plan - 12/12/16 0805    Clinical Impression Statement Today's session continued to focus on modified yoga sequence for core activation/proximal stability training, chest expansion, balance and training for floor<>stand transfers. Session included climbing A-frame ladder while carrying various weights to prepare for work-related tasks. Patient continues to have deficits in mobility, strength and endurance, and he will benefit from continued therapy to progress toward goals.   Rehab Potential Good   Clinical Impairments Affecting Rehab Potential recent lumbar surgery-pain and mm tightness   PT Frequency 2x / week   PT Duration 8 weeks   PT Treatment/Interventions ADLs/Self Care Home Management;Cryotherapy;Electrical Stimulation;Moist Heat;Neuromuscular re-education;Balance training;Therapeutic exercise;Therapeutic activities;Functional mobility training;Patient/family education;Gait training;DME Instruction;Stair training;Orthotic Fit/Training;Taping;Passive range of motion   PT Next Visit Plan Check BP manually with XL cuff(goal BP 130/90); LTG due by 8/24 (decide  whether to recert or D/C); work on ambulation long distances outside! lifting technique progressing amount of weight carrying, up/down ladder, NMR RLE/proximal stability   PT Home Exercise Plan Provided with yoga sequence   Consulted and Agree with Plan of Care Patient      Patient will benefit from skilled therapeutic intervention in order to improve the following deficits and impairments:  Abnormal gait, Decreased balance, Decreased mobility, Decreased strength, Pain, Difficulty walking, Improper body mechanics, Decreased range of motion  Visit Diagnosis: Hemiplegia and hemiparesis following cerebral infarction affecting right non-dominant side (HCC)  Muscle weakness (generalized)  Other abnormalities of gait and mobility  Unsteadiness on feet  Acute midline low back pain without sciatica     Problem List Patient Active Problem List   Diagnosis Date Noted  . Abnormality of gait as late effect of stroke 08/30/2016  . Hemiparesis affecting dominant side as late effect of cerebrovascular accident (CVA) ([place]) 08/02/2016  . Disorder of lumbosacral plexus 08/02/2016  . Small vessel disease, cerebrovascular 07/05/2016  . Right sided weakness 07/03/2016  . Facial droop 07/03/2016  . Stroke ([place]) 07/03/2016  . Anemia   . Tachycardia   . Back pain 06/27/2016    Gershon Crane, SPT 12/12/2016, 11:36 AM  [facility] [address] [place], [place], [address] Phone: [phone]   Fax:  [phone]  Name: Darrell Watson MRN: [id number] Date of Birth: 1961-01-24

## 2016-12-12 NOTE — Patient Instructions (Signed)
Cat-Cow Pose Breathe out when rounding your back, breathe in when arching your back  Rest in Child's Pose Bring hips back to heels, bend elbows to floor to keep shoulder down         Table Top Crunch and Reach Opposite arm and leg reach out and then crunch in.  Switch sides.  Do 6 times total.   Thread the Needle Rotate and reach out to side (don't force the left shoulder up as high); bring arm through to other side.  Perform 3 per side     Kneeling on knees-step one foot forwards (lower picture) Raise back knee (top picture)   Rotate back foot/heel to floor and turn with arms out straight to  Transition to Warrior  2  To return to kneeling: turn back foot forwards, lower back knee to floor, bring front knee back down to floor.  Switch sides and repeat.  2 times each side.  Return to your back and perform 5-8 bridges making sure to contract your gluteus muscles  Roll to your stomach and perform child's pose again at the end.

## 2016-12-16 ENCOUNTER — Ambulatory Visit: Payer: 59 | Admitting: Physical Therapy

## 2016-12-16 ENCOUNTER — Telehealth: Payer: Self-pay | Admitting: Physical Medicine & Rehabilitation

## 2016-12-16 ENCOUNTER — Telehealth: Payer: Self-pay

## 2016-12-16 DIAGNOSIS — I69353 Hemiplegia and hemiparesis following cerebral infarction affecting right non-dominant side: Secondary | ICD-10-CM

## 2016-12-16 DIAGNOSIS — M545 Low back pain, unspecified: Secondary | ICD-10-CM

## 2016-12-16 DIAGNOSIS — M6281 Muscle weakness (generalized): Secondary | ICD-10-CM

## 2016-12-16 DIAGNOSIS — R2689 Other abnormalities of gait and mobility: Secondary | ICD-10-CM

## 2016-12-16 DIAGNOSIS — R2681 Unsteadiness on feet: Secondary | ICD-10-CM

## 2016-12-16 NOTE — Therapy (Signed)
[facility] [address] [place], [place], [address] Phone: [phone]   Fax:  [phone]  Physical Therapy Treatment  Patient Details  Name: Darrell Watson MRN: [id number] Date of Birth: 1960/10/29 Referring Provider: Charlett Blake, MD  Encounter Date: 12/16/2016      PT End of Session - 12/16/16 0846    Visit Number 14   Number of Visits 17   Date for PT Re-Evaluation 12/20/16   Authorization Type UHC-60 visit limit combined   Authorization - Visit Number 8  [place] and outpatient combined   Authorization - Number of Visits 60   PT Start Time 0846   PT Stop Time 0929   PT Time Calculation (min) 43 min   Activity Tolerance Patient tolerated treatment well   Behavior During Therapy [facility] for tasks assessed/performed      Past Medical History:  Diagnosis Date  . Anemia   . Arthritis   . Stroke ([place])   . Tachycardia     Past Surgical History:  Procedure Laterality Date  . ABDOMINAL EXPOSURE N/A 06/27/2016   Procedure: ABDOMINAL EXPOSURE;  Surgeon: Angelia Mould, MD;  Location: [place];  Service: Vascular;  Laterality: N/A;  . ANTERIOR LUMBAR FUSION N/A 06/27/2016   Procedure: ANTERIOR LUMBAR FUSION L5-S1;  Surgeon: Melina Schools, MD;  Location: [facility] [place];  Service: Orthopedics;  Laterality: N/A;  Requests 3 hours  . FINGER AMPUTATION Left   . SHOULDER SURGERY            Subjective Assessment - 12/16/16 0845    Subjective Pt reports feeling a "catch" in his back on Friday that limited his ability to perform activities; he rested most of the weekend. Did report doing lumbar stretches this morning before getting out of bed. Also is having "sinus" issues with sneezing and a runny nose; says he took Clarice Pole allergy/cold medicine this morning.   Pertinent History lumbar fusion 06/27/16, L CVA with R hemiparesis   Limitations Standing;Walking   Patient Stated Goals improve RLE strength; reports RUE strength  has improved from working out at gym; wants to be able to get back to work   Currently in Pain? Yes   Pain Score 1    Pain Location Back   Pain Orientation Lower   Pain Descriptors / Indicators Sore   Pain Type Chronic pain   Pain Onset More than a month ago            [facility] PT Assessment - 12/16/16 0845      6 Minute Walk- Baseline   6 Minute Walk- Baseline yes   BP (mmHg) 136/88  XL cuff, L arm; taken in seated post-test after 5 mins rest     6 Minute walk- Post Test   6 Minute Walk Post Test yes   BP (mmHg) (!)  144/98  XL cuff, L arm; taken in seated immed after test     6 minute walk test results    Aerobic Endurance Distance Walked 936   Endurance additional comments Performed outside on paved level/unlevel surfaces     Functional Gait  Assessment   Gait assessed  Yes   Gait Level Surface Walks 20 ft, slow speed, abnormal gait pattern, evidence for imbalance or deviates 10-15 in outside of the 12 in walkway width. Requires more than 7 sec to ambulate 20 ft.   Change in Gait Speed Makes only minor adjustments to walking speed, or accomplishes a change in speed with  significant gait deviations, deviates 10-15 in outside the 12 in walkway width, or changes speed but loses balance but is able to recover and continue walking.   Gait with Horizontal Head Turns Performs head turns smoothly with slight change in gait velocity (eg, minor disruption to smooth gait path), deviates 6-10 in outside 12 in walkway width, or uses an assistive device.   Gait with Vertical Head Turns Performs task with moderate change in gait velocity, slows down, deviates 10-15 in outside 12 in walkway width but recovers, can continue to walk.   Gait and Pivot Turn Turns slowly, requires verbal cueing, or requires several small steps to catch balance following turn and stop   Step Over Obstacle Is able to step over one shoe box (4.5 in total height) but must slow down and adjust steps to clear box safely.  May require verbal cueing.   Gait with Narrow Base of Support Ambulates less than 4 steps heel to toe or cannot perform without assistance.   Gait with Eyes Closed Walks 20 ft, slow speed, abnormal gait pattern, evidence for imbalance, deviates 10-15 in outside 12 in walkway width. Requires more than 9 sec to ambulate 20 ft.   Ambulating Backwards Walks 20 ft, slow speed, abnormal gait pattern, evidence for imbalance, deviates 10-15 in outside 12 in walkway width.   Steps Alternating feet, must use rail.   Total Score 11   FGA comment: <19 = high fal lrisk                     [facility] Adult PT Treatment/Exercise - 12/16/16 0845      Ambulation/Gait   Ambulation/Gait Yes   Ambulation/Gait Assistance 5: Supervision   Ambulation Distance (Feet) 1247 Feet   Assistive device None   Gait Pattern Step-through pattern;Decreased arm swing - right;Decreased arm swing - left;Decreased step length - right;Decreased step length - left;Decreased hip/knee flexion - right;Decreased hip/knee flexion - left;Wide base of support;Decreased trunk rotation   Ambulation Surface Level;Unlevel;Indoor;Outdoor;Paved   Gait velocity 14.18s (2.31 ft/s) comfortable speed; 11.39s (2.88 ft/s) fast speed.   Stairs Yes   Stairs Assistance 5: Supervision   Stair Management Technique Two rails;Alternating pattern   Number of Stairs 4   Ramp 5: Supervision   Curb 5: Supervision     Neuro Re-ed    Neuro Re-ed Details  NMR during modified yoga poses beginning in quadruped performing cat/cow x 5 reps > child's pose with elbow flexion due to shoulder flexion limitations.                PT Education - 12/16/16 0845    Education provided Yes   Education Details Discussed progress toward LTGs and extending PT plan for 2x week/4 weeks   Person(s) Educated Patient   Methods Explanation   Comprehension Verbalized understanding;Need further instruction          PT Short Term Goals - 11/27/16 0813       PT SHORT TERM GOAL #1   Title TARGET DATE FOR ALL STG 11/20/16: Pt will improve functional LE strength by decreasing five times sit to stand time to < or = to 12 seconds   Baseline 14.76 seconds; 12.87 seconds improved but not to target   Time 4   Period Weeks   Status Partially Met     PT SHORT TERM GOAL #2   Title improve BERG balance score to >/= 50/56 for improved balance    Baseline 41/56; 53/56  Time 4   Period Weeks   Status Achieved     PT SHORT TERM GOAL #3   Title improve gait velocity to > 2.25 ft/sec for improved safety during community ambulation   Baseline 2.05 ft/sec, 2.6 ft/sec   Time 4   Period Weeks   Status Achieved     PT SHORT TERM GOAL #4   Title Will negotiate 8 stairs with one rail with alternating sequence and will ambulate outside x 500' over uneven grass and pavement MOD I    Status Partially Met     PT SHORT TERM GOAL #5   Title Participate in FGA assessment with LTG to be set   Baseline 16/30 on 10/31/16   Time 4   Period Weeks   Status Achieved           PT Long Term Goals - 12/16/16 0846      PT LONG TERM GOAL #1   Title TARGET DATE FOR ALL LTG 12/20/16: pt will be independent with HEP and will demonstrate safe technique for performing quad, glute, hamstring, gastroc/DF strengthening at gym and safe use of treadmill (use of machines)   Time 8   Period Weeks   Status On-going     PT LONG TERM GOAL #2   Title improve BERG balance score to >/= 53/56 for decreased fall risk    Baseline 53/56 on 8/1   Time 8   Period Weeks   Status On-going     PT LONG TERM GOAL #3   Title improve gait velocity to >/= 2.62 ft/sec for improved community access    Baseline 2.61 ft/sec on 8/1; 2.31 ft/sec on 8/20   Time 8   Period Weeks   Status Not Met     PT LONG TERM GOAL #4   Title amb > 1,000 feet on uneven paved, curb and grassy outdoor surfaces at MOD I for improved mobility to enter/exit work   Baseline amb >1,000 feet on uneven paved outdoor  surfaces at Supervision on 8/20   Time 8   Period Weeks   Status Partially Met     PT LONG TERM GOAL #5   Title See FGA goal below   Status Revised     PT LONG TERM GOAL #6   Title Pt will demonstrate safe lifting techniques (up to 25 lb) from various height surfaces and will carry objects x 150' over level surfaces with improved body mechanics.   Time 8   Period Weeks   Status On-going     PT LONG TERM GOAL #7   Title Will improve FOTO Neuro QOL LE by 20 points   Baseline 40   Time 8   Period Weeks   Status On-going     PT LONG TERM GOAL #8   Title Pt will improve FGA to 20/23 in order to indicate decreased fall risk.     Baseline 16/30 at baseline; 11/30 on 8/20   Time 8   Period Weeks   Status Not Met               Plan - 12/16/16 0846    Clinical Impression Statement Today's session focused on assessing patient's progress toward long term goals. Pt partially met 1 LTG but did not meet 2 LTGs. He has shown progress in endurance and ambulation distance on paved surfaces but he continues to have deficits with gait speed and higher level gait activities, which indicates he is at risk for falls and  limited function. Patient will benefit from continued skilled therapy to address these deficits and ensure safe functional mobility.   Rehab Potential Good   Clinical Impairments Affecting Rehab Potential recent lumbar surgery-pain and mm tightness   PT Frequency 2x / week   PT Duration 8 weeks   PT Treatment/Interventions ADLs/Self Care Home Management;Cryotherapy;Electrical Stimulation;Moist Heat;Neuromuscular re-education;Balance training;Therapeutic exercise;Therapeutic activities;Functional mobility training;Patient/family education;Gait training;DME Instruction;Stair training;Orthotic Fit/Training;Taping;Passive range of motion   PT Next Visit Plan *CHECK BP PRIOR TO EX* manually with XL cuff(goal BP 130/90); assess remaining LTGs (Berg, lifting, HEP) and recert   PT Home  Exercise Plan --   Consulted and Agree with Plan of Care Patient      Patient will benefit from skilled therapeutic intervention in order to improve the following deficits and impairments:  Abnormal gait, Decreased balance, Decreased mobility, Decreased strength, Pain, Difficulty walking, Improper body mechanics, Decreased range of motion  Visit Diagnosis: Hemiplegia and hemiparesis following cerebral infarction affecting right non-dominant side (HCC)  Muscle weakness (generalized)  Other abnormalities of gait and mobility  Unsteadiness on feet  Acute midline low back pain without sciatica     Problem List Patient Active Problem List   Diagnosis Date Noted  . Abnormality of gait as late effect of stroke 08/30/2016  . Hemiparesis affecting dominant side as late effect of cerebrovascular accident (CVA) ([place]) 08/02/2016  . Disorder of lumbosacral plexus 08/02/2016  . Small vessel disease, cerebrovascular 07/05/2016  . Right sided weakness 07/03/2016  . Facial droop 07/03/2016  . Stroke ([place]) 07/03/2016  . Anemia   . Tachycardia   . Back pain 06/27/2016   Gershon Crane, SPT 12/17/2016, 7:56 AM  Jamey Reas, PT, DPT 12/17/2016, 9:59 AM  [facility] [address] [place], [place], [address] Phone: [phone]   Fax:  [phone]  Name: TYWONE BEMBENEK MRN: [id number] Date of Birth: October 01, 1960

## 2016-12-16 NOTE — Progress Notes (Signed)
I reviewed note and agree with plan.   VIKRAM R. PENUMALLI, MD  Certified in Neurology, Neurophysiology and Neuroimaging  [facility] [address], [address] [place], [place] [address] [phone]   

## 2016-12-16 NOTE — Telephone Encounter (Signed)
Darrell Watson from [employer] has called in regards to Mr. Long disability forms. She would like clarification about his ambulatory.  Please fax information over to [phone]

## 2016-12-16 NOTE — Telephone Encounter (Signed)
RECD VOICEMAIL FROM JODY AT [facility] [employer] = SHE NEEDS DOCUMENTATION FROM [place]-- HE COMPLETED Friday [id number] - FAXED THIS AM AND SCANNED TO MEDIA- ORIGINAL IN [place]

## 2016-12-18 ENCOUNTER — Ambulatory Visit: Payer: 59 | Admitting: Physical Therapy

## 2016-12-18 VITALS — BP 140/102

## 2016-12-18 DIAGNOSIS — I69353 Hemiplegia and hemiparesis following cerebral infarction affecting right non-dominant side: Secondary | ICD-10-CM

## 2016-12-18 DIAGNOSIS — M545 Low back pain, unspecified: Secondary | ICD-10-CM

## 2016-12-18 DIAGNOSIS — R2689 Other abnormalities of gait and mobility: Secondary | ICD-10-CM

## 2016-12-18 DIAGNOSIS — M6281 Muscle weakness (generalized): Secondary | ICD-10-CM

## 2016-12-18 DIAGNOSIS — R2681 Unsteadiness on feet: Secondary | ICD-10-CM

## 2016-12-18 NOTE — Therapy (Signed)
[facility] [address] [place], [place], [address] Phone: [phone]   Fax:  [phone]  Physical Therapy Treatment  Patient Details  Name: Darrell Watson MRN: [id number] Date of Birth: 1961/03/15 Referring Provider: Charlett Blake, MD  Encounter Date: 12/18/2016      PT End of Session - 12/18/16 0851    Visit Number 15   Number of Visits 17   Date for PT Re-Evaluation 12/20/16   Authorization Type UHC-60 visit limit combined   Authorization - Visit Number 61  [place] and outpatient combined   Authorization - Number of Visits 60   PT Start Time 5686   PT Stop Time 0926   PT Time Calculation (min) 39 min   Activity Tolerance Patient tolerated treatment well   Behavior During Therapy [facility] for tasks assessed/performed      Past Medical History:  Diagnosis Date  . Anemia   . Arthritis   . Stroke ([place])   . Tachycardia     Past Surgical History:  Procedure Laterality Date  . ABDOMINAL EXPOSURE N/A 06/27/2016   Procedure: ABDOMINAL EXPOSURE;  Surgeon: Angelia Mould, MD;  Location: [facility];  Service: Vascular;  Laterality: N/A;  . ANTERIOR LUMBAR FUSION N/A 06/27/2016   Procedure: ANTERIOR LUMBAR FUSION L5-S1;  Surgeon: Melina Schools, MD;  Location: [facility];  Service: Orthopedics;  Laterality: N/A;  Requests 3 hours  . FINGER AMPUTATION Left   . SHOULDER SURGERY      Vitals:   12/18/16 0847  BP: (!) 140/102        Subjective Assessment - 12/18/16 0847    Subjective Reports back is better, has just been resting it. Talking to company about return to work. They wanted him back on the 20th but Dr Letta Pate sent restrictions about limiting to 1000' walking and climbing ladders. Company trying to decide if they can work with his restrictions. "I know I can't go back to work, but I'll do it even if I have to just sit there and do paperwork." Reports walking 15 minutes on treadmill this week before he got tired.    Pertinent History lumbar fusion 06/27/16, L CVA with R hemiparesis   Limitations Standing;Walking   Patient Stated Goals improve RLE strength; reports RUE strength has improved from working out at gym; wants to be able to get back to work   Currently in Pain? Yes   Pain Score 2    Pain Location Back   Pain Orientation Lower   Pain Descriptors / Indicators Sore   Pain Type Chronic pain   Pain Onset More than a month ago   Pain Frequency Intermittent            [facility] PT Assessment - 12/18/16 0851      Berg Balance Test   Sit to Stand Able to stand without using hands and stabilize independently   Standing Unsupported Able to stand safely 2 minutes   Sitting with Back Unsupported but Feet Supported on Floor or Stool Able to sit safely and securely 2 minutes   Stand to Sit Sits safely with minimal use of hands   Transfers Able to transfer safely, minor use of hands   Standing Unsupported with Eyes Closed Able to stand 10 seconds safely   Standing Ubsupported with Feet Together Able to place feet together independently and stand 1 minute safely   From Standing, Reach Forward with Outstretched Arm Can reach forward >12 cm safely (5")  From Standing Position, Pick up Object from [place] to pick up shoe, needs supervision   From Standing Position, Turn to Look Behind Over each Shoulder Looks behind one side only/other side shows less weight shift   Turn 360 Degrees Able to turn 360 degrees safely in 4 seconds or less   Standing Unsupported, Alternately Place Feet on Step/Stool Able to stand independently and safely and complete 8 steps in 20 seconds   Standing Unsupported, One Foot in [place] to place foot tandem independently and hold 30 seconds   Standing on One Leg Tries to lift leg/unable to hold 3 seconds but remains standing independently   Total Score 50   Berg comment: 50/56 low falls risk           PT Education - 12/18/16 0851    Education provided Yes   Education  Details Patient education on elevated blood pressure as a contraindication for exercise and risk factors for stroke. Recommended he record daily BP readings at home throughout the day and discuss with his physician.   Person(s) Educated Patient   Methods Explanation   Comprehension Verbalized understanding;Need further instruction          PT Short Term Goals - 11/27/16 0813      PT SHORT TERM GOAL #1   Title TARGET DATE FOR ALL STG 11/20/16: Pt will improve functional LE strength by decreasing five times sit to stand time to < or = to 12 seconds   Baseline 14.76 seconds; 12.87 seconds improved but not to target   Time 4   Period Weeks   Status Partially Met     PT SHORT TERM GOAL #2   Title improve BERG balance score to >/= 50/56 for improved balance    Baseline 41/56; 53/56   Time 4   Period Weeks   Status Achieved     PT SHORT TERM GOAL #3   Title improve gait velocity to > 2.25 ft/sec for improved safety during community ambulation   Baseline 2.05 ft/sec, 2.6 ft/sec   Time 4   Period Weeks   Status Achieved     PT SHORT TERM GOAL #4   Title Will negotiate 8 stairs with one rail with alternating sequence and will ambulate outside x 500' over uneven grass and pavement MOD I    Status Partially Met     PT SHORT TERM GOAL #5   Title Participate in FGA assessment with LTG to be set   Baseline 16/30 on 10/31/16   Time 4   Period Weeks   Status Achieved           PT Long Term Goals - 12/18/16 5465      PT LONG TERM GOAL #1   Title TARGET DATE FOR ALL LTG 12/20/16: pt will be independent with HEP and will demonstrate safe technique for performing quad, glute, hamstring, gastroc/DF strengthening at gym and safe use of treadmill (use of machines)   Time 8   Period Weeks   Status On-going     PT LONG TERM GOAL #2   Title improve BERG balance score to >/= 53/56 for decreased fall risk    Baseline 53/56 on 8/1; 50/56 on 8/22   Time 8   Period Weeks   Status Not Met      PT LONG TERM GOAL #3   Title improve gait velocity to >/= 2.62 ft/sec for improved community access    Baseline 2.61 ft/sec on 8/1; 2.31 ft/sec on  8/20   Time 8   Period Weeks   Status Not Met     PT LONG TERM GOAL #4   Title amb > 1,000 feet on uneven paved, curb and grassy outdoor surfaces at MOD I for improved mobility to enter/exit work   Baseline amb >1,000 feet on uneven paved outdoor surfaces at Supervision on 8/20   Time 8   Period Weeks   Status Partially Met     PT LONG TERM GOAL #5   Title See FGA goal below   Status Revised     PT LONG TERM GOAL #6   Title Pt will demonstrate safe lifting techniques (up to 25 lb) from various height surfaces and will carry objects x 150' over level surfaces with improved body mechanics.   Time 8   Period Weeks   Status On-going     PT LONG TERM GOAL #7   Title Will improve FOTO Neuro QOL LE by 20 points   Baseline 40   Time 8   Period Weeks   Status On-going     PT LONG TERM GOAL #8   Title Pt will improve FGA to 20/23 in order to indicate decreased fall risk.     Baseline 16/30 at baseline; 11/30 on 8/20   Time 8   Period Weeks   Status Not Met               Plan - 12/18/16 0851    Clinical Impression Statement Today's session focused on assessing patient's progress toward long term goals. Patient did not meet Berg goal, and lifting goal was not assessed due to elevated blood pressure (139/101) after activity. Patient was advised to discuss his blood pressure with his physician to determine how best to manage. He continues to be at a risk for falls and reduced function and should benefit from continued therapy to address these deficits.   Rehab Potential Good   Clinical Impairments Affecting Rehab Potential recent lumbar surgery-pain and mm tightness   PT Frequency 2x / week   PT Duration 8 weeks   PT Treatment/Interventions ADLs/Self Care Home Management;Cryotherapy;Electrical Stimulation;Moist Heat;Neuromuscular  re-education;Balance training;Therapeutic exercise;Therapeutic activities;Functional mobility training;Patient/family education;Gait training;DME Instruction;Stair training;Orthotic Fit/Training;Taping;Passive range of motion   PT Next Visit Plan *CHECK BP PRIOR TO EX* manually with XL cuff(goal BP 130/90); if BP acceptable, perform lifting tasks, A-frame ladder activities. Monitor cardiovascular response to activity.   Consulted and Agree with Plan of Care Patient      Patient will benefit from skilled therapeutic intervention in order to improve the following deficits and impairments:  Abnormal gait, Decreased balance, Decreased mobility, Decreased strength, Pain, Difficulty walking, Improper body mechanics, Decreased range of motion  Visit Diagnosis: Hemiplegia and hemiparesis following cerebral infarction affecting right non-dominant side (HCC)  Other abnormalities of gait and mobility     Problem List Patient Active Problem List   Diagnosis Date Noted  . Abnormality of gait as late effect of stroke 08/30/2016  . Hemiparesis affecting dominant side as late effect of cerebrovascular accident (CVA) ([place]) 08/02/2016  . Disorder of lumbosacral plexus 08/02/2016  . Small vessel disease, cerebrovascular 07/05/2016  . Right sided weakness 07/03/2016  . Facial droop 07/03/2016  . Stroke ([place]) 07/03/2016  . Anemia   . Tachycardia   . Back pain 06/27/2016    Gershon Crane, SPT 12/18/2016, 9:54 AM  [facility] [address] [place], [place], [address] Phone: [phone]   Fax:  [phone]  Name: Darrell Watson MRN: [id number] Date of Birth: Mar 08, 1961

## 2016-12-19 DIAGNOSIS — Z4789 Encounter for other orthopedic aftercare: Secondary | ICD-10-CM | POA: Diagnosis not present

## 2016-12-19 DIAGNOSIS — G8929 Other chronic pain: Secondary | ICD-10-CM | POA: Diagnosis not present

## 2016-12-19 DIAGNOSIS — E291 Testicular hypofunction: Secondary | ICD-10-CM | POA: Diagnosis not present

## 2016-12-19 DIAGNOSIS — M5442 Lumbago with sciatica, left side: Secondary | ICD-10-CM | POA: Diagnosis not present

## 2016-12-23 ENCOUNTER — Ambulatory Visit: Payer: 59 | Admitting: Physical Therapy

## 2016-12-23 ENCOUNTER — Encounter: Payer: Self-pay | Admitting: Physical Therapy

## 2016-12-23 VITALS — BP 152/96

## 2016-12-23 DIAGNOSIS — R278 Other lack of coordination: Secondary | ICD-10-CM

## 2016-12-23 DIAGNOSIS — M6281 Muscle weakness (generalized): Secondary | ICD-10-CM

## 2016-12-23 DIAGNOSIS — R2681 Unsteadiness on feet: Secondary | ICD-10-CM

## 2016-12-23 DIAGNOSIS — I69353 Hemiplegia and hemiparesis following cerebral infarction affecting right non-dominant side: Secondary | ICD-10-CM | POA: Diagnosis not present

## 2016-12-23 DIAGNOSIS — R2689 Other abnormalities of gait and mobility: Secondary | ICD-10-CM

## 2016-12-23 NOTE — Therapy (Signed)
[facility] [address] [place], [place], [address] Phone: [phone]   Fax:  [phone]  Physical Therapy Treatment  Patient Details  Name: Darrell Watson MRN: [id number] Date of Birth: 1960-10-07 Referring Provider: Erick Colace, MD  Encounter Date: 12/23/2016      PT End of Session - 12/23/16 0900    Visit Number 16   Number of Visits 24  per recertification   Date for PT Re-Evaluation 01/19/17  per recertification   Authorization Type UHC-60 visit limit combined   Authorization - Visit Number 34  HH and outpatient combined   Authorization - Number of Visits 60   PT Start Time 0845   PT Stop Time 0930   PT Time Calculation (min) 45 min   Activity Tolerance Patient tolerated treatment well   Behavior During Therapy [facility] for tasks assessed/performed      Past Medical History:  Diagnosis Date  . Anemia   . Arthritis   . Stroke (HCC)   . Tachycardia     Past Surgical History:  Procedure Laterality Date  . ABDOMINAL EXPOSURE N/A 06/27/2016   Procedure: ABDOMINAL EXPOSURE;  Surgeon: Chuck Hint, MD;  Location: [facility] OR;  Service: Vascular;  Laterality: N/A;  . ANTERIOR LUMBAR FUSION N/A 06/27/2016   Procedure: ANTERIOR LUMBAR FUSION L5-S1;  Surgeon: Venita Lick, MD;  Location: [facility] OR;  Service: Orthopedics;  Laterality: N/A;  Requests 3 hours  . FINGER AMPUTATION Left   . SHOULDER SURGERY      Vitals:   12/23/16 0851  BP: (!) 152/96        Subjective Assessment - 12/23/16 0847    Subjective Pt reports having double vision Friday that lasted all day; vision was still "foggy" on Saturday. Did not seek medical attention. Denies having symptoms today. No falls to report. [employer] headquarters in [place] are pressing not the local. He has been taking BP and log it to take to doctor next visit.    Pertinent History lumbar fusion 06/27/16, L CVA with R hemiparesis   Limitations Standing;Walking    How long can you stand comfortably? 2-3 hours doing yard work-but LE fatigues quickly   Patient Stated Goals improve RLE strength; reports RUE strength has improved from working out at gym; wants to be able to get back to work   Currently in Pain? No/denies                         [facility] Adult PT Treatment/Exercise - 12/23/16 0900      Ambulation/Gait   Ambulation/Gait Yes   Ambulation/Gait Assistance 5: Supervision   Assistive device None   Gait Pattern Step-through pattern;Decreased arm swing - right;Decreased arm swing - left;Decreased step length - right;Decreased step length - left;Decreased hip/knee flexion - right;Decreased hip/knee flexion - left;Wide base of support;Decreased trunk rotation   Ambulation Surface Level;Unlevel;Indoor   Stairs Yes   Stairs Assistance 5: Supervision   Stair Management Technique One rail Right;Alternating pattern   Ramp 5: Supervision   Curb 5: Supervision     High Level Balance   High Level Balance Activities Braiding;Backward walking;Tandem walking;Other (comment)  heel walking, toe walking, cross over front, cross over back   High Level Balance Comments Pt performed side steps with front foot crossover, back foot crossover, braiding/grapevine, tandem, toes and heels with counter for support. 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 PT provided minA to prevent LOB/for safety due to fatigue.     Exercises   Exercises Other Exercises  cat/cow and child's pose w/lateral stretches for lower back     Knee/Hip Exercises: Aerobic   Nustep 5 min using LEs and UEs level 5; PT verbally instructed in set-up, resistance level, rationale /  benefits for machine and building up muscle endurance.                PT Education - 12/23/16 [phone]    Education Details Patient education on his risk for stroke and the symptoms to look for that indicate an impending stroke.   Person(s) Educated Patient   Methods Explanation   Comprehension Verbalized understanding;Need further instruction          PT Short Term Goals - 12/19/16 1011      PT SHORT TERM GOAL #1   Title = LTG-30 day recertification           PT Long Term Goals - 12/19/16 1011      PT LONG TERM GOAL #1   Title Pt will be independent with HEP and will demonstrate safe technique for performing quad, glute, hamstring, gastroc/DF strengthening at gym and safe use of treadmill (use of machines)   Time 4   Period Weeks   Status Revised   Target Date 01/19/17     PT LONG TERM GOAL #2   Title improve BERG balance score to >/= 53/56 for decreased fall risk    Baseline score decreased to 50/56 on 8/22   Time 4   Period Weeks   Status Revised   Target Date 01/19/17     PT LONG TERM GOAL #3   Title improve gait velocity to >/= 2.62 ft/sec for improved community access    Baseline decreased to 2.31 ft/sec on 8/20   Time 4   Period Weeks   Status Revised   Target Date 01/19/17     PT LONG TERM GOAL #4   Title amb > 1,000 feet on uneven paved, curb and grassy outdoor surfaces at MOD I for improved mobility to enter/exit work   Baseline amb >1,000 feet on uneven paved outdoor surfaces at Supervision on 8/20   Time 4   Period Weeks   Status Revised   Target Date 01/19/17     PT LONG TERM GOAL #5   Title Pt will demonstrate safe ascending/descending A-frame ladder while carrying object up to 15lb in one hand, MOD I   Time 4   Period Weeks   Status New   Target Date 01/19/17     PT LONG TERM GOAL #6   Title Pt will demonstrate safe lifting techniques (up to 25 lb) from various height surfaces and will carry objects x 150' over level surfaces with  improved body mechanics.     PT LONG TERM GOAL #7   Title Will improve FOTO Neuro QOL LE by 20 points (01/19/2017)   Baseline 40   Time 4   Period Weeks   Status Revised     PT LONG TERM GOAL #8   Title Pt will improve FGA to 20/23 in order to indicate decreased fall risk.  (01/19/2017)   Baseline decreased to 11/30 on 8/20  Time 4   Period Weeks   Status Revised               Plan - 12/23/16 1355    Clinical Impression Statement Today's session focused on carrying items up and down an A-frame ladder for work simulation, stretches for lumbar spine, lower extremity coordination and balance activities. Patient continues to be at a risk for falls due to deficits in gait and mobility and will benefit from continued therapy to progress toward goals. Patient educated on signs/symptoms of stroke and his stroke risks. He appears to understand that he should have sought medical attention for his double vision over the weekend and when he should do so in the future.   PT Next Visit Plan *CHECK BP PRIOR TO EX* manually with XL cuff(goal BP 130/90); if BP acceptable, perform lifting tasks, A-frame ladder activities, activities to challenge balance and LE coordination. Monitor cardiovascular response to activity.      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 Abnormality of gait as late effect of stroke 08/30/2016  . Hemiparesis affecting dominant side as late effect of cerebrovascular accident (CVA) (HCC) 08/02/2016  . Disorder of lumbosacral plexus 08/02/2016  . Small vessel disease,  cerebrovascular 07/05/2016  . Right sided weakness 07/03/2016  . Facial droop 07/03/2016  . Stroke (HCC) 07/03/2016  . Anemia   . Tachycardia   . Back pain 06/27/2016    Lenda Kelp, SPT 12/23/2016, 5:39 PM  Vladimir Faster, PT, DPT PT Specializing in Prosthetics & Orthotics 12/23/16 10:05 PM Phone:  [phone]  Fax:  [phone] [facility] [address] [place], [place] [address]  [facility] [address] [place], [place], [address] Phone: [phone]   Fax:  [phone]  Name: Darrell Watson MRN: [id number] Date of Birth: 1961-01-24

## 2016-12-23 NOTE — Patient Instructions (Addendum)
All done near counter or railing of deck for safety with goal to use light touch.   Crossovers    Move to side: 1) cross right leg in front, then 2) bring back leg out to side. Repeat sequence in same direction. Reverse sequence, moving in opposite direction. Repeat sequence 2 times per session. Do 1sessions per day.   Copyright  VHI. All rights reserved.  Walking on Toes    Walk on toes for 10 feet while continuing on a straight path.  Do 1sessions per day.  Copyright  VHI. All rights reserved.  Walking on Heels    Walk on heels for 10 feet while continuing on a straight path. Do 1sessions per day.  Copyright  VHI. All rights reserved.  Braiding    Move to side: 1) cross right leg in front of left, 2) bring back leg out to side, then 3) cross right leg behind left, 4) bring left leg out to side. Continue sequence in same direction. Reverse sequence, moving in opposite direction. Repeat sequence 2times per session. Do 1sessions per day.   Copyright  VHI. All rights reserved.  Feet Heel-Toe "Tandem"    Arms outstretched, walk a straight line bringing one foot directly in front of the other. Repeat for 10 feet per session. Do 1 sessions per day.  Copyright  VHI. All rights reserved.

## 2016-12-25 ENCOUNTER — Ambulatory Visit: Payer: 59 | Admitting: Physical Therapy

## 2016-12-25 ENCOUNTER — Encounter: Payer: Self-pay | Admitting: Physical Therapy

## 2016-12-25 VITALS — BP 148/90

## 2016-12-25 DIAGNOSIS — I69353 Hemiplegia and hemiparesis following cerebral infarction affecting right non-dominant side: Secondary | ICD-10-CM

## 2016-12-25 DIAGNOSIS — R2689 Other abnormalities of gait and mobility: Secondary | ICD-10-CM

## 2016-12-25 DIAGNOSIS — R2681 Unsteadiness on feet: Secondary | ICD-10-CM

## 2016-12-25 DIAGNOSIS — R278 Other lack of coordination: Secondary | ICD-10-CM

## 2016-12-25 DIAGNOSIS — M6281 Muscle weakness (generalized): Secondary | ICD-10-CM

## 2016-12-25 DIAGNOSIS — M545 Low back pain, unspecified: Secondary | ICD-10-CM

## 2016-12-25 NOTE — Therapy (Signed)
[facility] [address] [place], [place], [address] Phone: [phone]   Fax:  [phone]  Physical Therapy Treatment  Patient Details  Name: Darrell Watson MRN: [id number] Date of Birth: April 14, 1961 Referring Provider: Erick Colace, MD  Encounter Date: 12/25/2016      PT End of Session - 12/25/16 1840    Visit Number 17   Number of Visits 24  per recertification   Date for PT Re-Evaluation 01/19/17  per recertification   Authorization Type UHC-60 visit limit combined   Authorization - Visit Number 35  HH and outpatient combined   Authorization - Number of Visits 60   PT Start Time 0845   PT Stop Time 0930   PT Time Calculation (min) 45 min   Activity Tolerance Patient tolerated treatment well   Behavior During Therapy [facility] for tasks assessed/performed      Past Medical History:  Diagnosis Date  . Anemia   . Arthritis   . Stroke (HCC)   . Tachycardia     Past Surgical History:  Procedure Laterality Date  . ABDOMINAL EXPOSURE N/A 06/27/2016   Procedure: ABDOMINAL EXPOSURE;  Surgeon: Chuck Hint, MD;  [facility];  Service: Vascular;  Laterality: N/A;  . ANTERIOR LUMBAR FUSION N/A 06/27/2016   Procedure: ANTERIOR LUMBAR FUSION L5-S1;  Surgeon: Venita Lick, MD;  Location: [facility] OR;  Service: Orthopedics;  Laterality: N/A;  Requests 3 hours  . FINGER AMPUTATION Left   . SHOULDER SURGERY      Vitals:   12/25/16 0851  BP: (!) 148/90        Subjective Assessment - 12/25/16 0851    Subjective No falls. His BP at home last night was 125/65.    Pertinent History lumbar fusion 06/27/16, L CVA with R hemiparesis   Limitations Standing;Walking   Diagnostic tests     Patient Stated Goals improve RLE strength; reports RUE strength has improved from working out at gym; wants to be able to get back to work   Currently in Pain? No/denies      Yoga poses to stretch in closed chain position near  counter for UE support to get into the position: Warrior I & Warrior II with 30 sec hold 2 reps per direction.  Gait Trainer program on Treadmill with verbal cues on stance limb extension / power up and step length on swing limb. Alternated side of focus. Initially 1. progressing to 2.75mph.  Post results indicated step length, consistency of steps and stance duration (51% LLE: 49% RLE) were WNL for age but slower pace. Stairs with light UE support reciprocal 4 steps 10 reps with verbal cues on wt shift.  PT demo lifting with mat table behind to facilitate proper technique for body mechanics, progressed to turning 90* & placing on chest ht table. Pt return demo understanding with 30# box with verbal cues.  A-frame ladder climbing 2 steps with verbal cues on technique but LEs began to spasm so PT stopped ladder for today due to safety.  Pt ambulated in hall for visual reference with head turns to scan with tactile & verbal cues for maintaining path & pace.                               PT Short Term Goals - 12/19/16 1011      PT SHORT TERM GOAL #1   Title = LTG-30 day recertification  PT Long Term Goals - 12/19/16 1011      PT LONG TERM GOAL #1   Title Pt will be independent with HEP and will demonstrate safe technique for performing quad, glute, hamstring, gastroc/DF strengthening at gym and safe use of treadmill (use of machines)   Time 4   Period Weeks   Status Revised   Target Date 01/19/17     PT LONG TERM GOAL #2   Title improve BERG balance score to >/= 53/56 for decreased fall risk    Baseline score decreased to 50/56 on 8/22   Time 4   Period Weeks   Status Revised   Target Date 01/19/17     PT LONG TERM GOAL #3   Title improve gait velocity to >/= 2.62 ft/sec for improved community access    Baseline decreased to 2.31 ft/sec on 8/20   Time 4   Period Weeks   Status Revised   Target Date 01/19/17     PT LONG TERM GOAL #4   Title  amb > 1,000 feet on uneven paved, curb and grassy outdoor surfaces at MOD I for improved mobility to enter/exit work   Baseline amb >1,000 feet on uneven paved outdoor surfaces at Supervision on 8/20   Time 4   Period Weeks   Status Revised   Target Date 01/19/17     PT LONG TERM GOAL #5   Title Pt will demonstrate safe ascending/descending A-frame ladder while carrying object up to 15lb in one hand, MOD I   Time 4   Period Weeks   Status New   Target Date 01/19/17     PT LONG TERM GOAL #6   Title Pt will demonstrate safe lifting techniques (up to 25 lb) from various height surfaces and will carry objects x 150' over level surfaces with improved body mechanics.     PT LONG TERM GOAL #7   Title Will improve FOTO Neuro QOL LE by 20 points (01/19/2017)   Baseline 40   Time 4   Period Weeks   Status Revised     PT LONG TERM GOAL #8   Title Pt will improve FGA to 20/23 in order to indicate decreased fall risk.  (01/19/2017)   Baseline decreased to 11/30 on 8/20   Time 4   Period Weeks   Status Revised               Plan - 12/25/16 1841    Clinical Impression Statement Patient had improved fluency & step thru pattern after using Gait Trainer program on treadmill and performing standing Yoga stretches. He improved lifitng, turning 90* and lifting 30# box onto chest ht table.    Rehab Potential Good   Clinical Impairments Affecting Rehab Potential recent lumbar surgery-pain and mm tightness   PT Frequency 2x / week   PT Duration 4 weeks   PT Treatment/Interventions ADLs/Self Care Home Management;Cryotherapy;Electrical Stimulation;Moist Heat;Neuromuscular re-education;Balance training;Therapeutic exercise;Therapeutic activities;Functional mobility training;Patient/family education;Gait training;DME Instruction;Stair training;Orthotic Fit/Training;Taping;Passive range of motion;Aquatic Therapy   PT Next Visit Plan *CHECK BP PRIOR TO EX* manually with XL cuff(goal BP 130/90); if BP  acceptable, perform lifting tasks, A-frame ladder activities, activities to challenge balance and LE coordination. Monitor cardiovascular response to activity.   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 Abnormality of gait as late effect of stroke 08/30/2016  . Hemiparesis affecting dominant side as late effect of cerebrovascular accident (CVA) (HCC) 08/02/2016  . Disorder of lumbosacral plexus 08/02/2016  . Small vessel disease, cerebrovascular 07/05/2016  . Right sided weakness 07/03/2016  . Facial droop 07/03/2016  . Stroke (HCC) 07/03/2016  . Anemia   . Tachycardia   . Back pain 06/27/2016    Vladimir Faster PT, DPT 12/25/2016, 6:46 PM  [facility] [address] [place], [place], [address] Phone: [phone]   Fax:  [phone]  Name: ISIAH SCHEEL MRN: [id number] Date of Birth: 30-Dec-1960

## 2016-12-26 ENCOUNTER — Telehealth: Payer: Self-pay | Admitting: Physical Medicine & Rehabilitation

## 2016-12-26 NOTE — Telephone Encounter (Signed)
Patient can ambulate 1000 feet, but then will need to rest for an hour before doing this again

## 2016-12-26 NOTE — Telephone Encounter (Signed)
JODI NURSE CASE MANAGER WITH [employer] STATES THEY NEED CLARIFICATION FROM DR Wynn BankerKIRSTEINS PLEASE ABOUT THE WORK REST STATEMENT , WHAT DOES 1000 FEET AMBULATION MEAN - PER DAY- PER TIME, HOW MUCH REST BETWEEN TIMES - NEED IT IN WRITING TO SATISFY WRK COMP GUIDELINES PLEASE

## 2016-12-26 NOTE — Telephone Encounter (Signed)
Letter printed.

## 2016-12-27 ENCOUNTER — Telehealth: Payer: Self-pay | Admitting: Physical Medicine & Rehabilitation

## 2016-12-27 NOTE — Telephone Encounter (Signed)
FAXED ADDITIONAL LETTER TO [employer] FOR CLARIFICATION

## 2016-12-31 ENCOUNTER — Encounter: Payer: Self-pay | Admitting: Physical Therapy

## 2016-12-31 ENCOUNTER — Ambulatory Visit: Payer: 59 | Attending: Physical Medicine & Rehabilitation | Admitting: Physical Therapy

## 2016-12-31 VITALS — BP 134/92

## 2016-12-31 DIAGNOSIS — M6281 Muscle weakness (generalized): Secondary | ICD-10-CM | POA: Diagnosis present

## 2016-12-31 DIAGNOSIS — R278 Other lack of coordination: Secondary | ICD-10-CM | POA: Insufficient documentation

## 2016-12-31 DIAGNOSIS — M545 Low back pain: Secondary | ICD-10-CM | POA: Diagnosis present

## 2016-12-31 DIAGNOSIS — R42 Dizziness and giddiness: Secondary | ICD-10-CM | POA: Insufficient documentation

## 2016-12-31 DIAGNOSIS — I69353 Hemiplegia and hemiparesis following cerebral infarction affecting right non-dominant side: Secondary | ICD-10-CM | POA: Diagnosis present

## 2016-12-31 DIAGNOSIS — H8112 Benign paroxysmal vertigo, left ear: Secondary | ICD-10-CM | POA: Insufficient documentation

## 2016-12-31 DIAGNOSIS — R2689 Other abnormalities of gait and mobility: Secondary | ICD-10-CM | POA: Diagnosis present

## 2016-12-31 DIAGNOSIS — R2681 Unsteadiness on feet: Secondary | ICD-10-CM | POA: Diagnosis present

## 2016-12-31 NOTE — Therapy (Signed)
[facility] [address] [place], [place], [address] Phone: [phone]   Fax:  [phone]  Physical Therapy Treatment  Patient Details  Name: Darrell Watson MRN: [id number] Date of Birth: 12-18-1960 Referring Provider: Erick Colace, MD  Encounter Date: 12/31/2016      PT End of Session - 12/31/16 1407    Visit Number 18   Number of Visits 24  per recertification   Date for PT Re-Evaluation 01/19/17  per recertification   Authorization Type UHC-60 visit limit combined   Authorization - Visit Number 36  HH and outpatient combined   Authorization - Number of Visits 60   PT Start Time 0845   PT Stop Time 0929   PT Time Calculation (min) 44 min   Activity Tolerance Patient tolerated treatment well   Behavior During Therapy [facility] for tasks assessed/performed      Past Medical History:  Diagnosis Date  . Anemia   . Arthritis   . Stroke (HCC)   . Tachycardia     Past Surgical History:  Procedure Laterality Date  . ABDOMINAL EXPOSURE N/A 06/27/2016   Procedure: ABDOMINAL EXPOSURE;  Surgeon: Chuck Hint, MD;  Location: [facility] OR;  Service: Vascular;  Laterality: N/A;  . ANTERIOR LUMBAR FUSION N/A 06/27/2016   Procedure: ANTERIOR LUMBAR FUSION L5-S1;  Surgeon: Venita Lick, MD;  Location: [facility] OR;  Service: Orthopedics;  Laterality: N/A;  Requests 3 hours  . FINGER AMPUTATION Left   . SHOULDER SURGERY      Vitals:   12/31/16 0850  BP: (!) 134/92        Subjective Assessment - 12/31/16 0848    Subjective He got up early to stretch. No falls.    Pertinent History lumbar fusion 06/27/16, L CVA with R hemiparesis   Patient Stated Goals improve RLE strength; reports RUE strength has improved from working out at gym; wants to be able to get back to work   Currently in Pain? No/denies      Yoga poses to stretch in closed chain position near counter for UE support to get into the position: Warrior I &  Warrior II with 30 sec hold 2 reps per direction.  Squat lifting a 5# stool 10 reps with verbal cues on technique. Gait Trainer program on Treadmill with verbal cues on stance limb extension / power up and step length on swing limb. Alternated side of focus. Initially 1. progressing to 2. within 1 min. Pt tolerated 6 min.  Post results indicated speed, step length, consistency of steps and stance duration (51% LLE: 49% RLE) were WNL for age. Stairs with light UE support reciprocal 4 steps 5 reps with verbal cues on wt shift.  High level balance in hall for visual reference & intermittent UE support: tactile & verbal cues from PT required: braiding, marching forward/backward, tandem forward /backward, heel walking forward /backward (difficult MinA), toe walking forward /backward (difficult MinA), scanning right/left, up/down & diagonals with tactile & verbal cues for maintaining path & pace.                              PT Short Term Goals - 12/19/16 1011      PT SHORT TERM GOAL #1   Title = LTG-30 day recertification           PT Long Term Goals - 12/19/16 1011      PT LONG TERM GOAL #1  Title Pt will be independent with HEP and will demonstrate safe technique for performing quad, glute, hamstring, gastroc/DF strengthening at gym and safe use of treadmill (use of machines)   Time 4   Period Weeks   Status Revised   Target Date 01/19/17     PT LONG TERM GOAL #2   Title improve BERG balance score to >/= 53/56 for decreased fall risk    Baseline score decreased to 50/56 on 8/22   Time 4   Period Weeks   Status Revised   Target Date 01/19/17     PT LONG TERM GOAL #3   Title improve gait velocity to >/= 2.62 ft/sec for improved community access    Baseline decreased to 2.31 ft/sec on 8/20   Time 4   Period Weeks   Status Revised   Target Date 01/19/17     PT LONG TERM GOAL #4   Title amb > 1,000 feet on uneven paved, curb and grassy outdoor  surfaces at MOD I for improved mobility to enter/exit work   Baseline amb >1,000 feet on uneven paved outdoor surfaces at Supervision on 8/20   Time 4   Period Weeks   Status Revised   Target Date 01/19/17     PT LONG TERM GOAL #5   Title Pt will demonstrate safe ascending/descending A-frame ladder while carrying object up to 15lb in one hand, MOD I   Time 4   Period Weeks   Status New   Target Date 01/19/17     PT LONG TERM GOAL #6   Title Pt will demonstrate safe lifting techniques (up to 25 lb) from various height surfaces and will carry objects x 150' over level surfaces with improved body mechanics.     PT LONG TERM GOAL #7   Title Will improve FOTO Neuro QOL LE by 20 points (01/19/2017)   Baseline 40   Time 4   Period Weeks   Status Revised     PT LONG TERM GOAL #8   Title Pt will improve FGA to 20/23 in order to indicate decreased fall risk.  (01/19/2017)   Baseline decreased to 11/30 on 8/20   Time 4   Period Weeks   Status Revised               Plan - 12/31/16 1407    Clinical Impression Statement Patient tolerated 30 minutes of standing / gait activities but LE were fatigued (pt reported "feels like Jello"). Patient requires tactile & manual cues for high level balance activities.    Rehab Potential Good   Clinical Impairments Affecting Rehab Potential recent lumbar surgery-pain and mm tightness   PT Frequency 2x / week   PT Duration 4 weeks   PT Treatment/Interventions ADLs/Self Care Home Management;Cryotherapy;Electrical Stimulation;Moist Heat;Neuromuscular re-education;Balance training;Therapeutic exercise;Therapeutic activities;Functional mobility training;Patient/family education;Gait training;DME Instruction;Stair training;Orthotic Fit/Training;Taping;Passive range of motion;Aquatic Therapy   PT Next Visit Plan *CHECK BP PRIOR TO EX* manually with XL cuff(goal BP 130/90); if BP acceptable, perform lifting tasks, A-frame ladder activities, activities to  challenge balance and LE coordination. Monitor cardiovascular response to activity.   Consulted and Agree with Plan of Care Patient      Patient will benefit from skilled therapeutic intervention in order to improve the following deficits and impairments:  Abnormal gait, Decreased balance, Decreased mobility, Decreased strength, Pain, Difficulty walking, Improper body mechanics, Decreased range of motion, Increased muscle spasms, Impaired flexibility  Visit Diagnosis: Hemiplegia and hemiparesis following cerebral infarction affecting right non-dominant side (  HCC)  Other abnormalities of gait and mobility  Muscle weakness (generalized)  Unsteadiness on feet  Other lack of coordination     Problem List Patient Active Problem List   Diagnosis Date Noted  . Abnormality of gait as late effect of stroke 08/30/2016  . Hemiparesis affecting dominant side as late effect of cerebrovascular accident (CVA) (HCC) 08/02/2016  . Disorder of lumbosacral plexus 08/02/2016  . Small vessel disease, cerebrovascular 07/05/2016  . Right sided weakness 07/03/2016  . Facial droop 07/03/2016  . Stroke (HCC) 07/03/2016  . Anemia   . Tachycardia   . Back pain 06/27/2016    Vladimir Faster PT, DPT 12/31/2016, 2:10 PM  [facility] [address] [place], [place], [address] Phone: [phone]   Fax:  [phone]  Name: Darrell Watson MRN: [id number] Date of Birth: Sep 27, 1960

## 2017-01-01 ENCOUNTER — Telehealth: Payer: Self-pay

## 2017-01-01 NOTE — Telephone Encounter (Signed)
Patient has called stating he needs an appointment once a month with Dr. Wynn BankerKirsteins. Patient States "being that he is out of work his job requires an appointment with Dr. Wynn BankerKirsteins monthly" . Appointment has been made for September. Patient has an October appointment already schedule.

## 2017-01-02 ENCOUNTER — Ambulatory Visit: Payer: 59 | Admitting: Physical Therapy

## 2017-01-02 VITALS — BP 125/95 | HR 102

## 2017-01-02 DIAGNOSIS — M545 Low back pain, unspecified: Secondary | ICD-10-CM

## 2017-01-02 DIAGNOSIS — I69353 Hemiplegia and hemiparesis following cerebral infarction affecting right non-dominant side: Secondary | ICD-10-CM

## 2017-01-02 DIAGNOSIS — M6281 Muscle weakness (generalized): Secondary | ICD-10-CM

## 2017-01-02 DIAGNOSIS — R2689 Other abnormalities of gait and mobility: Secondary | ICD-10-CM

## 2017-01-02 DIAGNOSIS — R278 Other lack of coordination: Secondary | ICD-10-CM

## 2017-01-02 DIAGNOSIS — E291 Testicular hypofunction: Secondary | ICD-10-CM | POA: Diagnosis not present

## 2017-01-02 DIAGNOSIS — R2681 Unsteadiness on feet: Secondary | ICD-10-CM
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
## 2017-01-06 ENCOUNTER — Ambulatory Visit: Payer: 59 | Admitting: Physical Therapy

## 2017-01-06 ENCOUNTER — Encounter: Payer: Self-pay | Admitting: Physical Therapy

## 2017-01-06 VITALS — BP 130/98

## 2017-01-06 DIAGNOSIS — I69353 Hemiplegia and hemiparesis following cerebral infarction affecting right non-dominant side: Secondary | ICD-10-CM | POA: Diagnosis not present

## 2017-01-06 DIAGNOSIS — R278 Other lack of coordination: Secondary | ICD-10-CM

## 2017-01-06 DIAGNOSIS — R2681 Unsteadiness on feet: Secondary | ICD-10-CM

## 2017-01-06 DIAGNOSIS — M6281 Muscle weakness (generalized): Secondary | ICD-10-CM

## 2017-01-06 DIAGNOSIS — R2689 Other abnormalities of gait and mobility: Secondary | ICD-10-CM

## 2017-01-06 NOTE — Therapy (Signed)
[facility] [address] [place], [place], [address] Phone: [phone]   Fax:  [phone]  Physical Therapy Treatment  Patient Details  Name: Darrell HeirOllis M Watson MRN: [id number] Date of Birth: 1961-02-12 Referring Provider: Erick ColaceAndrew E Kirsteins, MD  Encounter Date: 01/06/2017      PT End of Session - 01/06/17 2109    Visit Number 20   Number of Visits 24   Date for PT Re-Evaluation 01/19/17   Authorization Type UHC-60 visit limit combined   Authorization - Visit Number 38   Authorization - Number of Visits 60   PT Start Time 1015   PT Stop Time 1100   PT Time Calculation (min) 45 min   Activity Tolerance Patient tolerated treatment well   Behavior During Therapy [facility] for tasks assessed/performed      Past Medical History:  Diagnosis Date  . Anemia   . Arthritis   . Stroke (HCC)   . Tachycardia     Past Surgical History:  Procedure Laterality Date  . ABDOMINAL EXPOSURE N/A 06/27/2016   Procedure: ABDOMINAL EXPOSURE;  Surgeon: Chuck Hinthristopher S Dickson, MD;  [facility];  Service: Vascular;  Laterality: N/A;  . ANTERIOR LUMBAR FUSION N/A 06/27/2016   Procedure: ANTERIOR LUMBAR FUSION L5-S1;  Surgeon: Venita Lickahari Brooks, MD;  Location: [facility] OR;  Service: Orthopedics;  Laterality: N/A;  Requests 3 hours  . FINGER AMPUTATION Left   . SHOULDER SURGERY      Vitals:   01/06/17 1023  BP: (!) 130/98        Subjective Assessment - 01/06/17 1020    Subjective He got food poisoning & was sick over the weekend. No falls.    Pertinent History lumbar fusion 06/27/16, L CVA with R hemiparesis   Limitations Standing;Walking   Patient Stated Goals improve RLE strength; reports RUE strength has improved from working out at gym; wants to be able to get back to work   Currently in Pain? No/denies                         [facility] Adult PT Treatment/Exercise - 01/06/17 1015      Transfers   Floor to Transfer 5:  Supervision;With upper extremity assist  pushing on mat table BUEs then progressed to single UE   Floor to Transfer Details (indicate cue type and reason) demo & verbal cues on technique     Ambulation/Gait   Ambulation/Gait Yes   Ambulation/Gait Assistance 5: Supervision   Ambulation/Gait Assistance Details verbal cues on knee flexion in swing and step width   Ambulation Distance (Feet) 1000 Feet   Assistive device None   Ambulation Surface Indoor;Level   Stairs Yes   Stairs Assistance 5: Supervision   Stairs Assistance Details (indicate cue type and reason) verbal cues on ankle motion descending   Stair Management Technique Two rails;Alternating pattern;Forwards   Number of Stairs 4  5 reps   Gait Comments Gait trainer program on treadmill 2.3mph 6 min with verbal & visual cues on step length & cadence     High Level Balance   High Level Balance Activities Side stepping;Braiding;Backward walking;Tandem walking;Marching forwards;Marching backwards   High Level Balance Comments single UE touch on wall, tactile & verbal cues on technique & balance reaction     Therapeutic Activites    Lifting Lifting & carrying 20# crate 75' X 4  PT Short Term Goals - 12/19/16 1011      PT SHORT TERM GOAL #1   Title = LTG-30 day recertification           PT Long Term Goals - 12/19/16 1011      PT LONG TERM GOAL #1   Title Pt will be independent with HEP and will demonstrate safe technique for performing quad, glute, hamstring, gastroc/DF strengthening at gym and safe use of treadmill (use of machines)   Time 4   Period Weeks   Status Revised   Target Date 01/19/17     PT LONG TERM GOAL #2   Title improve BERG balance score to >/= 53/56 for decreased fall risk    Baseline score decreased to 50/56 on 8/22   Time 4   Period Weeks   Status Revised   Target Date 01/19/17     PT LONG TERM GOAL #3   Title improve gait velocity to >/= 2.62 ft/sec for improved  community access    Baseline decreased to 2.31 ft/sec on 8/20   Time 4   Period Weeks   Status Revised   Target Date 01/19/17     PT LONG TERM GOAL #4   Title amb > 1,000 feet on uneven paved, curb and grassy outdoor surfaces at MOD I for improved mobility to enter/exit work   Baseline amb >1,000 feet on uneven paved outdoor surfaces at Supervision on 8/20   Time 4   Period Weeks   Status Revised   Target Date 01/19/17     PT LONG TERM GOAL #5   Title Pt will demonstrate safe ascending/descending A-frame ladder while carrying object up to 15lb in one hand, MOD I   Time 4   Period Weeks   Status New   Target Date 01/19/17     PT LONG TERM GOAL #6   Title Pt will demonstrate safe lifting techniques (up to 25 lb) from various height surfaces and will carry objects x 150' over level surfaces with improved body mechanics.     PT LONG TERM GOAL #7   Title Will improve FOTO Neuro QOL LE by 20 points (01/19/2017)   Baseline 40   Time 4   Period Weeks   Status Revised     PT LONG TERM GOAL #8   Title Pt will improve FGA to 20/23 in order to indicate decreased fall risk.  (01/19/2017)   Baseline decreased to 11/30 on 8/20   Time 4   Period Weeks   Status Revised               Plan - 01/06/17 2109    Clinical Impression Statement Patient's lower extremities continue to fatigue with advanced balance activities. He had increased difficulty with work simulation tasks today due to fatigue from illness over the weekend.    PT Treatment/Interventions ADLs/Self Care Home Management;Cryotherapy;Electrical Stimulation;Moist Heat;Neuromuscular re-education;Balance training;Therapeutic exercise;Therapeutic activities;Functional mobility training;Patient/family education;Gait training;DME Instruction;Stair training;Orthotic Fit/Training;Taping;Passive range of motion;Aquatic Therapy   PT Next Visit Plan *CHECK BP PRIOR TO EX* manually with XL cuff(goal BP 130/90); if BP acceptable, perform  lifting tasks, A-frame ladder activities, activities to challenge balance and LE coordination. Monitor cardiovascular response to activity.   Consulted and Agree with Plan of Care Patient      Patient will benefit from skilled therapeutic intervention in order to improve the following deficits and impairments:  Abnormal gait, Decreased balance, Decreased mobility, Decreased strength, Pain, Difficulty walking, Improper body mechanics, Decreased range  of motion, Increased muscle spasms, Impaired flexibility  Visit Diagnosis: Hemiplegia and hemiparesis following cerebral infarction affecting right non-dominant side (HCC)  Other abnormalities of gait and mobility  Muscle weakness (generalized)  Unsteadiness on feet  Other lack of coordination     Problem List Patient Active Problem List   Diagnosis Date Noted  . Abnormality of gait as late effect of stroke 08/30/2016  . Hemiparesis affecting dominant side as late effect of cerebrovascular accident (CVA) (HCC) 08/02/2016  . Disorder of lumbosacral plexus 08/02/2016  . Small vessel disease, cerebrovascular 07/05/2016  . Right sided weakness 07/03/2016  . Facial droop 07/03/2016  . Stroke (HCC) 07/03/2016  . Anemia   . Tachycardia   . Back pain 06/27/2016    Vladimir Faster PT, DPT 01/06/2017, 9:12 PM  [facility] [address] [place], [place], [address] Phone: [phone]   Fax:  [phone]  Name: VONTRELL PULLMAN MRN: [id number] Date of Birth: 01-19-1961

## 2017-01-08 ENCOUNTER — Ambulatory Visit: Payer: 59 | Admitting: Physical Therapy

## 2017-01-08 ENCOUNTER — Encounter: Payer: Self-pay | Admitting: Physical Therapy

## 2017-01-08 VITALS — BP 142/84

## 2017-01-08 DIAGNOSIS — R2681 Unsteadiness on feet: Secondary | ICD-10-CM

## 2017-01-08 DIAGNOSIS — M545 Low back pain, unspecified: Secondary | ICD-10-CM

## 2017-01-08 DIAGNOSIS — R2689 Other abnormalities of gait and mobility: Secondary | ICD-10-CM

## 2017-01-08 DIAGNOSIS — R278 Other lack of coordination: Secondary | ICD-10-CM

## 2017-01-08 DIAGNOSIS — I69353 Hemiplegia and hemiparesis following cerebral infarction affecting right non-dominant side: Secondary | ICD-10-CM

## 2017-01-08 DIAGNOSIS — M6281 Muscle weakness (generalized): Secondary | ICD-10-CM

## 2017-01-08 NOTE — Therapy (Signed)
[facility] [address] [place], [place], [address] Phone: [phone]   Fax:  [phone]  Physical Therapy Treatment  Patient Details  Name: Darrell Watson MRN: [id number] Date of Birth: 1960/08/01 Referring Provider: Erick Colace, MD  Encounter Date: 01/08/2017      PT End of Session - 01/08/17 1219    Visit Number 21   Number of Visits 24   Date for PT Re-Evaluation 01/19/17   Authorization Type UHC-60 visit limit combined   Authorization - Visit Number 39   Authorization - Number of Visits 60   PT Start Time 0845   PT Stop Time 0930   PT Time Calculation (min) 45 min   Activity Tolerance Patient tolerated treatment well   Behavior During Therapy [facility] for tasks assessed/performed      Past Medical History:  Diagnosis Date  . Anemia   . Arthritis   . Stroke (HCC)   . Tachycardia     Past Surgical History:  Procedure Laterality Date  . ABDOMINAL EXPOSURE N/A 06/27/2016   Procedure: ABDOMINAL EXPOSURE;  Surgeon: Chuck Hint, MD;  Location: [facility] OR;  Service: Vascular;  Laterality: N/A;  . ANTERIOR LUMBAR FUSION N/A 06/27/2016   Procedure: ANTERIOR LUMBAR FUSION L5-S1;  Surgeon: Venita Lick, MD;  Location: [facility] OR;  Service: Orthopedics;  Laterality: N/A;  Requests 3 hours  . FINGER AMPUTATION Left   . SHOULDER SURGERY      Vitals:   01/08/17 0850  BP: (!) 142/84        Subjective Assessment - 01/08/17 0849    Subjective His wife thinks he may have vertigo since food poisoning episode because he is more off balance. He denies dizziness.    Pertinent History lumbar fusion 06/27/16, L CVA with R hemiparesis   Limitations Standing;Walking   Patient Stated Goals improve RLE strength; reports RUE strength has improved from working out at gym; wants to be able to get back to work   Currently in Pain? No/denies     Therapeutic Exercise: Yoga Warrior 1 & 2 near counter for intermittent UE  support 30 sec hold each direction.  Standing with heel off step gastroc stretch 30 sec hold 2 reps on each LE.   Gait Training:  Treadmill Gait Trainer program for biofeedback on step length & cadence at 2.2 mph X 6 min.  Stairs with light UE support on rails 4 steps 6 reps. He became dizzy with vision changes with descent.           Vestibular Assessment - 01/08/17 1300      Vestibular Assessment   General Observation pt reporting having a feeling of "double vision" when descending stairs.  Reports this feeling has been going on for a while but has not been severe.  Pt states his wife said he had "vertigo"; denies spinning sensation     Symptom Behavior   Type of Dizziness Diplopia   Frequency of Dizziness intermittent   Duration of Dizziness seconds-minutes     Positional Testing   Dix-Hallpike Dix-Hallpike Left     Dix-Hallpike Left   Dix-Hallpike Left Duration nystagmus lasted > 1 minute but pt's vertigo resolved in 45 seconds, reports symptoms as mild   Dix-Hallpike Left Symptoms Upbeat, left rotatory nystagmus                  Vestibular Treatment/Exercise - 01/08/17 1303      Vestibular Treatment/Exercise   Vestibular Treatment Provided Canalith  Repositioning   Canalith Repositioning Epley Manuever Left      EPLEY MANUEVER LEFT   Number of Reps  1   Overall Response  No change    RESPONSE DETAILS LEFT due to duration of nystagmus and symptoms pt may present with L posterior canal cupulolithiasis; unable to assess further.  Will continue to assess and treat as indicated on Monday.                 PT Short Term Goals - 12/19/16 1011      PT SHORT TERM GOAL #1   Title = LTG-30 day recertification           PT Long Term Goals - 12/19/16 1011      PT LONG TERM GOAL #1   Title Pt will be independent with HEP and will demonstrate safe technique for performing quad, glute, hamstring, gastroc/DF strengthening at gym and safe use of  treadmill (use of machines)   Time 4   Period Weeks   Status Revised   Target Date 01/19/17     PT LONG TERM GOAL #2   Title improve BERG balance score to >/= 53/56 for decreased fall risk    Baseline score decreased to 50/56 on 8/22   Time 4   Period Weeks   Status Revised   Target Date 01/19/17     PT LONG TERM GOAL #3   Title improve gait velocity to >/= 2.62 ft/sec for improved community access    Baseline decreased to 2.31 ft/sec on 8/20   Time 4   Period Weeks   Status Revised   Target Date 01/19/17     PT LONG TERM GOAL #4   Title amb > 1,000 feet on uneven paved, curb and grassy outdoor surfaces at MOD I for improved mobility to enter/exit work   Baseline amb >1,000 feet on uneven paved outdoor surfaces at Supervision on 8/20   Time 4   Period Weeks   Status Revised   Target Date 01/19/17     PT LONG TERM GOAL #5   Title Pt will demonstrate safe ascending/descending A-frame ladder while carrying object up to 15lb in one hand, MOD I   Time 4   Period Weeks   Status New   Target Date 01/19/17     PT LONG TERM GOAL #6   Title Pt will demonstrate safe lifting techniques (up to 25 lb) from various height surfaces and will carry objects x 150' over level surfaces with improved body mechanics.     PT LONG TERM GOAL #7   Title Will improve FOTO Neuro QOL LE by 20 points (01/19/2017)   Baseline 40   Time 4   Period Weeks   Status Revised     PT LONG TERM GOAL #8   Title Pt will improve FGA to 20/23 in order to indicate decreased fall risk.  (01/19/2017)   Baseline decreased to 11/30 on 8/20   Time 4   Period Weeks   Status Revised               Plan - 01/08/17 1219    Clinical Impression Statement Patient had vertigo with double vision with activities today. Upon quick assessment of vestibular system pt found to be + for vertigo and nystagmus during L Hallpike-Dix testing.  Treated x 1 with CRM with no change in symptoms.  Primary PT will assess further at  next appointment to determine if pt presents with L posterior canal  canalithiasis or cupulolithiasis and will provide appropriate treatment.  Will also assess if other canals are involved.  Patient will probably need recertification to meet LTG of returning to work.    Rehab Potential Good   Clinical Impairments Affecting Rehab Potential recent lumbar surgery-pain and mm tightness   PT Treatment/Interventions ADLs/Self Care Home Management;Cryotherapy;Electrical Stimulation;Moist Heat;Neuromuscular re-education;Balance training;Therapeutic exercise;Therapeutic activities;Functional mobility training;Patient/family education;Gait training;DME Instruction;Stair training;Orthotic Fit/Training;Taping;Passive range of motion;Aquatic Therapy   PT Next Visit Plan *CHECK BP PRIOR TO EX* manually with XL cuff(goal BP 130/90); Vestibular assessment and treatment. Progress activities. Check LTGs and recertify.    Consulted and Agree with Plan of Care Patient      Patient will benefit from skilled therapeutic intervention in order to improve the following deficits and impairments:  Abnormal gait, Decreased balance, Decreased mobility, Decreased strength, Pain, Difficulty walking, Improper body mechanics, Decreased range of motion, Increased muscle spasms, Impaired flexibility  Visit Diagnosis: Other abnormalities of gait and mobility  Hemiplegia and hemiparesis following cerebral infarction affecting right non-dominant side (HCC)  Muscle weakness (generalized)  Unsteadiness on feet  Other lack of coordination  Acute midline low back pain without sciatica     Problem List Patient Active Problem List   Diagnosis Date Noted  . Abnormality of gait as late effect of stroke 08/30/2016  . Hemiparesis affecting dominant side as late effect of cerebrovascular accident (CVA) (HCC) 08/02/2016  . Disorder of lumbosacral plexus 08/02/2016  . Small vessel disease, cerebrovascular 07/05/2016  . Right sided  weakness 07/03/2016  . Facial droop 07/03/2016  . Stroke (HCC) 07/03/2016  . Anemia   . Tachycardia   . Back pain 06/27/2016    Kresta Templeman PT, DPT 01/08/2017, 9:48 PM  [facility] [address] [place], [place], [address] Phone: [phone]   Fax:  [phone]  Name: JOSH NICOLOSI MRN: [id number] Date of Birth: 11/11/1960

## 2017-01-13 ENCOUNTER — Ambulatory Visit: Payer: 59 | Admitting: Physical Therapy

## 2017-01-13 ENCOUNTER — Encounter: Payer: Self-pay | Admitting: Physical Therapy

## 2017-01-13 DIAGNOSIS — R2681 Unsteadiness on feet: Secondary | ICD-10-CM

## 2017-01-13 DIAGNOSIS — I69353 Hemiplegia and hemiparesis following cerebral infarction affecting right non-dominant side: Secondary | ICD-10-CM | POA: Diagnosis not present

## 2017-01-13 DIAGNOSIS — M545 Low back pain, unspecified: Secondary | ICD-10-CM

## 2017-01-13 DIAGNOSIS — M6281 Muscle weakness (generalized): Secondary | ICD-10-CM

## 2017-01-13 DIAGNOSIS — R2689 Other abnormalities of gait and mobility: Secondary | ICD-10-CM

## 2017-01-13 NOTE — Therapy (Signed)
[facility] [address] [place], [place], [address] Phone: [phone]   Fax:  [phone]  Physical Therapy Treatment  Patient Details  Name: Darrell Watson MRN: [id number] Date of Birth: [birth year] Referring Provider: Charlett Blake, MD  Encounter Date: 01/13/2017      PT End of Session - 01/13/17 1027    Visit Number 22   Number of Visits 24   Date for PT Re-Evaluation 01/19/17   Authorization Type UHC-60 visit limit combined   Authorization - Visit Number 89   Authorization - Number of Visits 60   PT Start Time 0845   PT Stop Time 0930   PT Time Calculation (min) 45 min   Activity Tolerance Patient tolerated treatment well   Behavior During Therapy [facility] for tasks assessed/performed      Past Medical History:  Diagnosis Date  . Anemia   . Arthritis   . Stroke ([place])   . Tachycardia     Past Surgical History:  Procedure Laterality Date  . ABDOMINAL EXPOSURE N/A 06/27/2016   Procedure: ABDOMINAL EXPOSURE;  Surgeon: Angelia Mould, MD;  Location: [place];  Service: Vascular;  Laterality: N/A;  . ANTERIOR LUMBAR FUSION N/A 06/27/2016   Procedure: ANTERIOR LUMBAR FUSION L5-S1;  Surgeon: Melina Schools, MD;  Location: [place];  Service: Orthopedics;  Laterality: N/A;  Requests 3 hours  . FINGER AMPUTATION Left   . SHOULDER SURGERY      There were no vitals filed for this visit.      Subjective Assessment - 01/13/17 0845    Subjective Pt reports dizziness is unchanged from last week; no issues over the weekend-didn't do much because of the weather.     Pertinent History lumbar fusion 06/27/16, L CVA with R hemiparesis   Limitations Standing;Walking   How long can you stand comfortably? 2-3 hours doing yard work-but LE fatigues quickly   How long can you walk comfortably? at least 10 min   Patient Stated Goals improve RLE strength; reports RUE strength has improved from working out at gym; wants to be able  to get back to work   Currently in Pain? No/denies            [facility] PT Assessment - 01/13/17 0927      Standardized Balance Assessment   Standardized Balance Assessment 10 meter walk test   10 Meter Walk 13.3 or 2.46 ft/sec            Vestibular Assessment - 01/13/17 0848      Vestibular Assessment   General Observation pt reporting nausea and vomiting-thought it was food poisoning     Symptom Behavior   Type of Dizziness Imbalance     Positional Testing   Dix-Hallpike Dix-Hallpike Left;Dix-Hallpike Right   Sidelying Test Sidelying Left     Dix-Hallpike Right   Dix-Hallpike Right Duration 0   Dix-Hallpike Right Symptoms No nystagmus     Dix-Hallpike Left   Dix-Hallpike Left Duration >1 minute   Dix-Hallpike Left Symptoms Upbeat, left rotatory nystagmus     Sidelying Left   Sidelying Left Duration no vertigo but nystagmus noted   Sidelying Left Symptoms Upbeat, left rotatory nystagmus     Orthostatics   BP sitting 130/85   HR sitting 105                  Vestibular Treatment/Exercise - 01/13/17 0904      Vestibular Treatment/Exercise   Vestibular Treatment Provided Canalith Repositioning  Canalith Repositioning Semont Procedure Left Posterior     Semont Procedure Left Posterior   Number of Reps  2   Overall Response No change   Response Details  Pt with one significant LOB when ambulating to bathroom with min-mod A to recover; pt walking close to and touching wall intermittently.                  PT Education - 01/13/17 1026    Education provided Yes   Education Details cupulolithiasis and treatment plan; plan to finish checking LTG and recertify   Person(s) Educated Patient   Methods Explanation   Comprehension Verbalized understanding          PT Short Term Goals - 12/19/16 1011      PT SHORT TERM GOAL #1   Title = RSW-54 day recertification           PT Long Term Goals - 01/13/17 1030      PT LONG TERM GOAL #1    Title Pt will be independent with HEP and will demonstrate safe technique for performing quad, glute, hamstring, gastroc/DF strengthening at gym and safe use of treadmill (use of machines)   Time 4   Period Weeks   Status On-going     PT LONG TERM GOAL #2   Title improve BERG balance score to >/= 53/56 for decreased fall risk    Baseline score decreased to 50/56 on 8/22   Time 4   Period Weeks   Status On-going     PT LONG TERM GOAL #3   Title improve gait velocity to >/= 2.62 ft/sec for improved community access    Baseline decreased to 2.31 ft/sec on 8/20, improved 2.46 but not to target goal-limited by dizziness today   Time 4   Period Weeks   Status Partially Met     PT LONG TERM GOAL #4   Title amb > 1,000 feet on uneven paved, curb and grassy outdoor surfaces at MOD I for improved mobility to enter/exit work   Baseline amb >1,000 feet on uneven paved outdoor surfaces at Supervision on 8/20   Time 4   Period Weeks   Status On-going     PT LONG TERM GOAL #5   Title Pt will demonstrate safe ascending/descending A-frame ladder while carrying object up to 15lb in one hand, MOD I   Time 4   Period Weeks   Status On-going     PT LONG TERM GOAL #6   Title Pt will demonstrate safe lifting techniques (up to 25 lb) from various height surfaces and will carry objects x 150' over level surfaces with improved body mechanics.   Status On-going     PT LONG TERM GOAL #7   Title Will improve FOTO Neuro QOL LE by 20 points (01/19/2017)   Baseline 40   Time 4   Period Weeks   Status On-going     PT LONG TERM GOAL #8   Title Pt will improve FGA to 20/23 in order to indicate decreased fall risk.  (01/19/2017)   Baseline decreased to 11/30 on 8/20   Time 4   Period Weeks   Status On-going               Plan - 01/13/17 1028    Clinical Impression Statement Pt continues to report mild dizziness and imbalance and does report nausea and vomiting at home which he first thought  was food poisoning.  Pt signs and symptoms most  consistent with L posterior canal cupulolithiasis; treated x 2 with liberatory maneuver with no change in symptoms.  Due to pt driving self today and going to gym did not continue treatment; will continue at next session and will provide with habituation.  Initiated re-assessment of LTG with gait velocity.  Velocity improved from last assessment but not to target goal; pt likely ambulating slower today due to dizziness and imbalance.  Will continue to assess LTG and plan to recertify at next visit.   Rehab Potential Good   Clinical Impairments Affecting Rehab Potential recent lumbar surgery-pain and mm tightness   PT Treatment/Interventions ADLs/Self Care Home Management;Cryotherapy;Electrical Stimulation;Moist Heat;Neuromuscular re-education;Balance training;Therapeutic exercise;Therapeutic activities;Functional mobility training;Patient/family education;Gait training;DME Instruction;Stair training;Orthotic Fit/Training;Taping;Passive range of motion;Aquatic Therapy   PT Next Visit Plan *CHECK BP PRIOR TO EX* manually with XL cuff(goal BP 130/90); re-test for L posterior cupulolithiasis and treat; provide brandt daroff; Check LTGs and recertify.  Aquatic therapy?   Consulted and Agree with Plan of Care Patient      Patient will benefit from skilled therapeutic intervention in order to improve the following deficits and impairments:  Abnormal gait, Decreased balance, Decreased mobility, Decreased strength, Pain, Difficulty walking, Improper body mechanics, Decreased range of motion, Increased muscle spasms, Impaired flexibility  Visit Diagnosis: Hemiplegia and hemiparesis following cerebral infarction affecting right non-dominant side (HCC)  Muscle weakness (generalized)  Other abnormalities of gait and mobility  Unsteadiness on feet  Acute midline low back pain without sciatica     Problem List Patient Active Problem List   Diagnosis Date  Noted  . Abnormality of gait as late effect of stroke 08/30/2016  . Hemiparesis affecting dominant side as late effect of cerebrovascular accident (CVA) ([place]) 08/02/2016  . Disorder of lumbosacral plexus 08/02/2016  . Small vessel disease, cerebrovascular 07/05/2016  . Right sided weakness 07/03/2016  . Facial droop 07/03/2016  . Stroke ([place]) 07/03/2016  . Anemia   . Tachycardia   . Back pain 06/27/2016    Raylene Everts, PT, DPT 01/13/17    10:37 AM    [place] [address] [place], [place], [address] Phone: [phone]   Fax:  [phone]  Name: DERAY DAWES MRN: [id number] Date of Birth: [birth year]

## 2017-01-15 ENCOUNTER — Ambulatory Visit: Payer: 59 | Admitting: Physical Therapy

## 2017-01-15 DIAGNOSIS — M6281 Muscle weakness (generalized): Secondary | ICD-10-CM

## 2017-01-15 DIAGNOSIS — I69353 Hemiplegia and hemiparesis following cerebral infarction affecting right non-dominant side: Secondary | ICD-10-CM

## 2017-01-15 NOTE — Therapy (Signed)
[facility] [address] [place], [place], [address] Phone: [phone]   Fax:  [phone]  Patient Details  Name: Darrell Watson MRN: [id number] Date of Birth: [birth year] Referring Provider:  Renford Dills, MD  Encounter Date: 01/15/2017 Pt arrived but needing to leave 20 minutes early for appt with physiatrist.  Due to brevity of session pt rescheduled for Friday am.  No charge for today due to no treatment administered.  Edman Circle, PT, DPT 01/15/17    9:10 AM    [facility] [address] [place], [place], [address] Phone: [phone]   Fax:  [phone]

## 2017-01-16 ENCOUNTER — Encounter: Payer: Self-pay | Admitting: Physical Medicine & Rehabilitation

## 2017-01-16 ENCOUNTER — Ambulatory Visit (HOSPITAL_BASED_OUTPATIENT_CLINIC_OR_DEPARTMENT_OTHER): Payer: 59 | Admitting: Physical Medicine & Rehabilitation

## 2017-01-16 ENCOUNTER — Encounter: Payer: 59 | Attending: Physical Medicine & Rehabilitation

## 2017-01-16 VITALS — BP 146/84 | HR 104

## 2017-01-16 DIAGNOSIS — I69359 Hemiplegia and hemiparesis following cerebral infarction affecting unspecified side: Secondary | ICD-10-CM

## 2017-01-16 DIAGNOSIS — I69398 Other sequelae of cerebral infarction: Secondary | ICD-10-CM

## 2017-01-16 DIAGNOSIS — R269 Unspecified abnormalities of gait and mobility: Secondary | ICD-10-CM | POA: Diagnosis not present

## 2017-01-16 DIAGNOSIS — I69351 Hemiplegia and hemiparesis following cerebral infarction affecting right dominant side: Secondary | ICD-10-CM | POA: Insufficient documentation

## 2017-01-16 DIAGNOSIS — Z981 Arthrodesis status: Secondary | ICD-10-CM | POA: Insufficient documentation

## 2017-01-16 DIAGNOSIS — E291 Testicular hypofunction: Secondary | ICD-10-CM | POA: Diagnosis not present

## 2017-01-16 NOTE — Progress Notes (Signed)
Subjective:    Patient ID: Darrell Watson, male    DOB: [birth year], 56 y.o.   MRN: [id number]  HPI Gait velocity is 2.3 mph Cut grass with push mower 1/4 acre Told by PT that he may have vertigo- tried [employer] with PT Nystagmus noted No falls, pt states dizziness is usually mild but at times curtails his activities Exercises weight machines, pec and chest  Does dumbell rows  [employer] Seat at [employer] when he hurried to catch a door He is independent with all his self-care and mobility, he drives, does yard work.  Notes that his parking spot at work is a long distance from his workplace.  Pain Inventory Average Pain 1 Pain Right Now 1 My pain is .  In the last 24 hours, has pain interfered with the following? General activity 2 Relation with others 0 Enjoyment of life 0 What TIME of day is your pain at its worst? daytime Sleep (in general) Fair  Pain is worse with: walking, bending, sitting and standing Pain improves with: ,. Relief from Meds: 9  Mobility walk without assistance ability to climb steps?  yes do you drive?  yes  Function employed # of hrs/week .  Neuro/Psych No problems in this area  Prior Studies Any changes since last visit?  no  Physicians involved in your care Any changes since last visit?  no   No family history on file. Social History   Social History  . Marital status: Married    Spouse name: N/A  . Number of children: N/A  . Years of education: N/A   Social History Main Topics  . Smoking status: Never Smoker  . Smokeless tobacco: Never Used  . Alcohol use Yes     Comment: BEER  OCC  . Drug use: No  . Sexual activity: Not on file   Other Topics Concern  . Not on file   Social History Narrative  . No narrative on file   Past Surgical History:  Procedure Laterality Date  . ABDOMINAL EXPOSURE N/A 06/27/2016   Procedure: ABDOMINAL EXPOSURE;  Surgeon: Chuck Hint, MD;  Location: [facility] OR;  Service: Vascular;  Laterality: N/A;   . ANTERIOR LUMBAR FUSION N/A 06/27/2016   Procedure: ANTERIOR LUMBAR FUSION L5-S1;  Surgeon: Venita Lick, MD;  Location: [facility] OR;  Service: Orthopedics;  Laterality: N/A;  Requests 3 hours  . FINGER AMPUTATION Left   . SHOULDER SURGERY     Past Medical History:  Diagnosis Date  . Anemia   . Arthritis   . Stroke (HCC)   . Tachycardia    There were no vitals taken for this visit.  Opioid Risk Score:   Fall Risk Score:  `1  Depression screen PHQ 2/9  Depression screen PHQ 2/9 08/02/2016  Decreased Interest 0  Down, Depressed, Hopeless 0  PHQ - 2 Score 0  Altered sleeping 0  Tired, decreased energy 0  Change in appetite 0  Feeling bad or failure about yourself  0  Trouble concentrating 0  Moving slowly or fidgety/restless 0  Suicidal thoughts 0  PHQ-9 Score 0  Difficult doing work/chores Not difficult at all     Review of Systems  Constitutional: Negative.   HENT: Negative.   Eyes: Negative.   Respiratory: Negative.   Cardiovascular: Negative.   Gastrointestinal: Negative.   Endocrine: Negative.   Genitourinary: Negative.   Musculoskeletal: Negative.   Skin: Negative.   Allergic/Immunologic: Negative.   Neurological: Negative.   Hematological: Negative.  Psychiatric/Behavioral: Negative.   All other systems reviewed and are negative.      Objective:   Physical Exam  Constitutional: He is oriented to person, place, and time. He appears well-developed and well-nourished.  HENT:  Head: Normocephalic and atraumatic.  Eyes: Pupils are equal, round, and reactive to light. Conjunctivae and EOM are normal.  Neck: Normal range of motion.  Musculoskeletal:  No Pain with upper limb or lower limb. Range of motion  Neurological: He is alert and oriented to person, place, and time.  Psychiatric: He has a normal mood and affect.  Nursing note and vitals reviewed.  Slow gait, stiff legged gait Motor strength is 5/5 bilateral deltoid, bicep, tricep,  grip. 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 Right lower extremity 5/5 strength in the hip flexor, knee extensor, ankle dorsi flexion Left lower extremity 5/5 in hip flexor, knee extensor, ankle dorsal flexor Mood and affect are appropriate Cognition is intact. Orientation is times 3, speech without dysarthria      Assessment & Plan:  1.  Left Internal capsule infarct- RLE>RUE weaknessMainly manifested as fatigue with prolonged activity, he also has motor control issues, particularly in the right lower extremity so that his coordination is poor, which also impacts on his balance. His gait is most affected. This is the limiting factor in terms of his return to work. He has been instructed to start walking 15 minutes a day and increase by 5 minutes every week. In 4 weeks, he should be up to 35 minutes per day. We will discuss return to work on a part-time basis at that time, no more than 4 hours per day initially. He is motivated in returning to work.  2.  Left lumbar plexopathy post op bleed now mainly an endurance issue this still may improve over the next year.

## 2017-01-16 NOTE — Patient Instructions (Signed)
Practice walking outside per day and increase by 5 minute per week

## 2017-01-17 ENCOUNTER — Ambulatory Visit: Payer: 59 | Admitting: Physical Therapy

## 2017-01-17 ENCOUNTER — Encounter: Payer: Self-pay | Admitting: Physical Therapy

## 2017-01-17 VITALS — BP 130/85

## 2017-01-17 DIAGNOSIS — M545 Low back pain, unspecified: Secondary | ICD-10-CM

## 2017-01-17 DIAGNOSIS — R2689 Other abnormalities of gait and mobility: Secondary | ICD-10-CM

## 2017-01-17 DIAGNOSIS — H8112 Benign paroxysmal vertigo, left ear: Secondary | ICD-10-CM

## 2017-01-17 DIAGNOSIS — R2681 Unsteadiness on feet: Secondary | ICD-10-CM

## 2017-01-17 DIAGNOSIS — I69353 Hemiplegia and hemiparesis following cerebral infarction affecting right non-dominant side: Secondary | ICD-10-CM | POA: Diagnosis not present

## 2017-01-17 DIAGNOSIS — M6281 Muscle weakness (generalized): Secondary | ICD-10-CM

## 2017-01-17 DIAGNOSIS — R42 Dizziness and giddiness: Secondary | ICD-10-CM

## 2017-01-17 NOTE — Patient Instructions (Signed)
Habituation - Tip Card  1.The goal of habituation training is to assist in decreasing symptoms of vertigo, dizziness, or nausea provoked by specific head and body motions. 2.These exercises may initially increase symptoms; however, be persistent and work through symptoms. With repetition and time, the exercises will assist in reducing or eliminating symptoms. 3.Exercises should be stopped and discussed with the therapist if you experience any of the following: - Sudden change or fluctuation in hearing - New onset of ringing in the ears, or increase in current intensity - Any fluid discharge from the ear - Severe pain in neck or back - Extreme nausea  Habituation - Sit to Side-Lying   Sit on edge of bed. Lie down onto the right side and hold until dizziness stops, plus 20 seconds.  Return to sitting and wait until dizziness stops, plus 20 seconds.  Repeat to the left side. Repeat sequence 5 times per session. Do 2 sessions per day.  Copyright  VHI. All rights reserved.     

## 2017-01-17 NOTE — Therapy (Signed)
[facility] [address] [place], [place], [address] Phone: [phone]   Fax:  [phone]  Physical Therapy Treatment  Patient Details  Name: Darrell Watson MRN: [id number] Date of Birth: Jul 16, 1960 Referring Provider: Charlett Blake, MD  Encounter Date: 01/17/2017      PT End of Session - 01/17/17 0957    Visit Number 23   Number of Visits 24   Date for PT Re-Evaluation 01/19/17   Authorization Type UHC-60 visit limit combined   Authorization - Visit Number 69   Authorization - Number of Visits 60   PT Start Time 0848   PT Stop Time 0928   PT Time Calculation (min) 40 min   Activity Tolerance Patient tolerated treatment well   Behavior During Therapy [facility] for tasks assessed/performed      Past Medical History:  Diagnosis Date  . Anemia   . Arthritis   . Stroke ([place])   . Tachycardia     Past Surgical History:  Procedure Laterality Date  . ABDOMINAL EXPOSURE N/A 06/27/2016   Procedure: ABDOMINAL EXPOSURE;  Surgeon: Angelia Mould, MD;  Location: [facility];  Service: Vascular;  Laterality: N/A;  . ANTERIOR LUMBAR FUSION N/A 06/27/2016   Procedure: ANTERIOR LUMBAR FUSION L5-S1;  Surgeon: Melina Schools, MD;  Location: [facility];  Service: Orthopedics;  Laterality: N/A;  Requests 3 hours  . FINGER AMPUTATION Left   . SHOULDER SURGERY      Vitals:   01/17/17 0856  BP: 130/85        Subjective Assessment - 01/17/17 0851    Subjective Pt had appointment with physiatrist yesterday who said he may allow him to go back to work part time next month.  Dizziness unchanged.   Pertinent History lumbar fusion 06/27/16, L CVA with R hemiparesis   Limitations Standing;Walking   How long can you stand comfortably? 2-3 hours doing yard work-but LE fatigues quickly   How long can you walk comfortably? at least 10 min   Patient Stated Goals improve RLE strength; reports RUE strength has improved from working out at gym;  wants to be able to get back to work   Currently in Pain? No/denies            [facility] PT Assessment - 01/17/17 0852      Assessment   Medical Diagnosis L CVA with R hemiparesis, lumbar fusion   Referring Provider Charlett Blake, MD   Onset Date/Surgical Date 07/03/16   Hand Dominance Left   Prior Therapy CIR, HHPT, outpatient     Precautions   Precaution Comments BLT precautions per pt     Berg Balance Test   Sit to Stand Able to stand without using hands and stabilize independently   Standing Unsupported Able to stand safely 2 minutes   Sitting with Back Unsupported but Feet Supported on Floor or Stool Able to sit safely and securely 2 minutes   Stand to Sit Sits safely with minimal use of hands   Transfers Able to transfer safely, minor use of hands   Standing Unsupported with Eyes Closed Able to stand 10 seconds safely   Standing Ubsupported with Feet Together Able to place feet together independently and stand 1 minute safely   From Standing, Reach Forward with Outstretched Arm Can reach confidently >25 cm (10")   From Standing Position, Pick up Object from Floor Able to pick up shoe safely and easily   From Standing Position, Turn to Look Behind  Over each Shoulder Looks behind from both sides and weight shifts well   Turn 360 Degrees Able to turn 360 degrees safely in 4 seconds or less   Standing Unsupported, Alternately Place Feet on Step/Stool Able to stand independently and safely and complete 8 steps in 20 seconds   Standing Unsupported, One Foot in Front Able to place foot tandem independently and hold 30 seconds   Standing on One Leg Able to lift leg independently and hold 5-10 seconds   Total Score 55   Berg comment: 55/56     Functional Gait  Assessment   Gait assessed  Yes   Gait Level Surface Walks 20 ft in less than 7 sec but greater than 5.5 sec, uses assistive device, slower speed, mild gait deviations, or deviates 6-10 in outside of the 12 in walkway  width.   Change in Gait Speed Makes only minor adjustments to walking speed, or accomplishes a change in speed with significant gait deviations, deviates 10-15 in outside the 12 in walkway width, or changes speed but loses balance but is able to recover and continue walking.   Gait with Horizontal Head Turns Performs head turns smoothly with slight change in gait velocity (eg, minor disruption to smooth gait path), deviates 6-10 in outside 12 in walkway width, or uses an assistive device.   Gait with Vertical Head Turns Performs task with moderate change in gait velocity, slows down, deviates 10-15 in outside 12 in walkway width but recovers, can continue to walk.   Gait and Pivot Turn Pivot turns safely in greater than 3 sec and stops with no loss of balance, or pivot turns safely within 3 sec and stops with mild imbalance, requires small steps to catch balance.   Step Over Obstacle Is able to step over one shoe box (4.5 in total height) without changing gait speed. No evidence of imbalance.   Gait with Narrow Base of Support Ambulates 4-7 steps.   Gait with Eyes Closed Walks 20 ft, slow speed, abnormal gait pattern, evidence for imbalance, deviates 10-15 in outside 12 in walkway width. Requires more than 9 sec to ambulate 20 ft.   Ambulating Backwards Walks 20 ft, uses assistive device, slower speed, mild gait deviations, deviates 6-10 in outside 12 in walkway width.   Steps Alternating feet, must use rail.   Total Score 16   FGA comment: <19 = high fall risk            Vestibular Assessment - 01/17/17 0912      Positional Testing   Dix-Hallpike Dix-Hallpike Left     Dix-Hallpike Left   Dix-Hallpike Left Duration >1 minute   Dix-Hallpike Left Symptoms Upbeat, left rotatory nystagmus                  Vestibular Treatment/Exercise - 01/17/17 3614      Vestibular Treatment/Exercise   Vestibular Treatment Provided Canalith Repositioning   Canalith Repositioning [place]  Procedure Left Posterior   Habituation Exercises Nestor Lewandowsky     Semont Procedure Left Posterior   Number of Reps  1   Overall Response No change               PT Education - 01/17/17 0931    Education provided Yes   Education Details clinical findings, goals and areas to continue to focus on with recertification   Person(s) Educated Patient   Methods Explanation   Comprehension Verbalized understanding          PT  Short Term Goals - 12/19/16 1011      PT SHORT TERM GOAL #1   Title = WRU-04 day recertification           PT Long Term Goals - 01/17/17 0955      PT LONG TERM GOAL #1   Title Pt will be independent with HEP and will demonstrate safe technique for performing quad, glute, hamstring, gastroc/DF strengthening at gym and safe use of treadmill (use of machines)   Time 4   Period Weeks   Status Partially Met     PT LONG TERM GOAL #2   Title improve BERG balance score to >/= 53/56 for decreased fall risk    Baseline score decreased to 50/56 on 8/22; 55/56 on 01/17/17   Time 4   Period Weeks   Status Achieved     PT LONG TERM GOAL #3   Title improve gait velocity to >/= 2.62 ft/sec for improved community access    Baseline decreased to 2.31 ft/sec on 8/20, improved 2.46 but not to target goal-limited by dizziness today   Time 4   Period Weeks   Status Partially Met     PT LONG TERM GOAL #4   Title amb > 1,000 feet on uneven paved, curb and grassy outdoor surfaces at MOD I for improved mobility to enter/exit work   Baseline amb >1,000 feet on uneven paved outdoor surfaces at Supervision on 8/20   Time 4   Period Weeks   Status Partially Met     PT LONG TERM GOAL #5   Title Pt will demonstrate safe ascending/descending A-frame ladder while carrying object up to 15lb in one hand, MOD I   Baseline supervision-min A   Time 4   Period Weeks   Status Not Met     PT LONG TERM GOAL #6   Title Pt will demonstrate safe lifting techniques (up to 25  lb) from various height surfaces and will carry objects x 150' over level surfaces with improved body mechanics.   Status Not Met     PT LONG TERM GOAL #7   Title Will improve FOTO Neuro QOL LE by 20 points (01/19/2017)   Baseline 40   Time 4   Period Weeks   Status On-going     PT LONG TERM GOAL #8   Title Pt will improve FGA to 20/23 in order to indicate decreased fall risk.  (01/19/2017)   Baseline decreased to 11/30 on 8/20; 16/30 on 01/17/17   Time 4   Period Weeks   Status Not Met               Plan - 01/17/17 0933    Clinical Impression Statement Continued assessment of LTG and continued to assess and address L posterior canal cupulolithiasis which has not converted to canalithiasis with 1-2 liberatory maneuvers.  Provided pt with Brandt-Daroff to perform over the weekend for habituation-will continue to assess and treat as needed at next session.  Pt has met 1/8 LTG making progress towards all other goals.  Pt has shown improvement in static and dynamic standing balance and demonstrates lower falls risk but continues to demonstrate increased falls risk with more dynamic, functional gait activities.  Pt also continues to be limited by hypertonicity, impaired coordination, impaired balance and balance reactions, impaired gait and dizziness.  Pt will benefit from ongoing skilled PT services to continue to address impairments, decrease falls risk and maximixe functional mobility independence.   Rehab Potential Good  Clinical Impairments Affecting Rehab Potential recent lumbar surgery-pain and mm tightness   PT Frequency 2x / week   PT Duration 8 weeks   PT Treatment/Interventions ADLs/Self Care Home Management;Cryotherapy;Electrical Stimulation;Moist Heat;Neuromuscular re-education;Balance training;Therapeutic exercise;Therapeutic activities;Functional mobility training;Patient/family education;Gait training;DME Instruction;Stair training;Orthotic Fit/Training;Taping;Passive range  of motion;Aquatic Therapy;Canalith Repostioning;Vestibular   PT Next Visit Plan *CHECK BP PRIOR TO EX* manually with XL cuff(goal BP 130/90); re-test for L posterior cupulolithiasis and treat; check brandt daroff; treadmill faster speeds, less UE support, stairs without UE support, ladder, lifting, split jumps for power, Aquatic therapy?   Consulted and Agree with Plan of Care Patient      Patient will benefit from skilled therapeutic intervention in order to improve the following deficits and impairments:  Abnormal gait, Decreased balance, Decreased mobility, Decreased strength, Pain, Difficulty walking, Improper body mechanics, Decreased range of motion, Increased muscle spasms, Impaired flexibility, Decreased coordination, Decreased endurance, Dizziness, Impaired tone  Visit Diagnosis: Hemiplegia and hemiparesis following cerebral infarction affecting right non-dominant side (HCC)  Muscle weakness (generalized)  Other abnormalities of gait and mobility  Unsteadiness on feet  Acute midline low back pain without sciatica  Dizziness and giddiness  BPPV (benign paroxysmal positional vertigo), left     Problem List Patient Active Problem List   Diagnosis Date Noted  . Abnormality of gait as late effect of stroke 08/30/2016  . Hemiparesis affecting dominant side as late effect of cerebrovascular accident (CVA) ([place]) 08/02/2016  . Disorder of lumbosacral plexus 08/02/2016  . Small vessel disease, cerebrovascular 07/05/2016  . Right sided weakness 07/03/2016  . Facial droop 07/03/2016  . Stroke ([place]) 07/03/2016  . Anemia   . Tachycardia   . Back pain 06/27/2016    Raylene Everts, PT, DPT 01/17/17    10:07 AM    [facility] [address] [place], [place], [address] Phone: [phone]   Fax:  [phone]  Name: Darrell Watson MRN: [id number] Date of Birth: 03/06/1961

## 2017-01-23 ENCOUNTER — Ambulatory Visit: Payer: 59 | Admitting: Physical Therapy

## 2017-01-23 ENCOUNTER — Encounter: Payer: Self-pay | Admitting: Physical Therapy

## 2017-01-23 DIAGNOSIS — R2681 Unsteadiness on feet: Secondary | ICD-10-CM

## 2017-01-23 DIAGNOSIS — M6281 Muscle weakness (generalized): Secondary | ICD-10-CM

## 2017-01-23 DIAGNOSIS — I69353 Hemiplegia and hemiparesis following cerebral infarction affecting right non-dominant side: Secondary | ICD-10-CM | POA: Diagnosis not present

## 2017-01-23 DIAGNOSIS — R2689 Other abnormalities of gait and mobility: Secondary | ICD-10-CM

## 2017-01-23 NOTE — Therapy (Signed)
[facility] [address] [place], [place], [address] Phone: [phone]   Fax:  [phone]  Physical Therapy Treatment  Patient Details  Name: Darrell Watson MRN: [id number] Date of Birth: 1961-01-01 Referring Provider: Erick Colace, MD  Encounter Date: 01/23/2017      PT End of Session - 01/23/17 1159    Visit Number 24   Number of Visits 39  per recertification   Date for PT Re-Evaluation 03/20/17   Authorization Type UHC-60 visit limit combined   Authorization - Visit Number 42   Authorization - Number of Visits 60   PT Start Time 1100   PT Stop Time 1152   PT Time Calculation (min) 52 min   Activity Tolerance Patient tolerated treatment well   Behavior During Therapy [facility] for tasks assessed/performed      Past Medical History:  Diagnosis Date  . Anemia   . Arthritis   . Stroke (HCC)   . Tachycardia     Past Surgical History:  Procedure Laterality Date  . ABDOMINAL EXPOSURE N/A 06/27/2016   Procedure: ABDOMINAL EXPOSURE;  Surgeon: Chuck Hint, MD;  Location: [facility] OR;  Service: Vascular;  Laterality: N/A;  . ANTERIOR LUMBAR FUSION N/A 06/27/2016   Procedure: ANTERIOR LUMBAR FUSION L5-S1;  Surgeon: Venita Lick, MD;  Location: [facility];  Service: Orthopedics;  Laterality: N/A;  Requests 3 hours  . FINGER AMPUTATION Left   . SHOULDER SURGERY      There were no vitals filed for this visit.      Subjective Assessment - 01/23/17 1107    Subjective Pt reports he has performed habituation exercise 2 times but has not noticed a big difference before and after.  Dizziness is slightly better overall.     Pertinent History lumbar fusion 06/27/16, L CVA with R hemiparesis   Limitations Standing;Walking   How long can you stand comfortably? 2-3 hours doing yard work-but LE fatigues quickly   How long can you walk comfortably? at least 10 min   Patient Stated Goals improve RLE strength; reports RUE  strength has improved from working out at gym; wants to be able to get back to work   Currently in Pain? No/denies                Vestibular Assessment - 01/23/17 1109      Vestibular Assessment   General Observation No further reports of nausea, vomiting or diplopia.       Symptom Behavior   Type of Dizziness Imbalance   Frequency of Dizziness intermittent  less intensity   Duration of Dizziness minutes   Aggravating Factors Spontaneous onset   Relieving Factors Comments  focuses eyes     Occulomotor Exam   Occulomotor Alignment Normal   Spontaneous Absent   Gaze-induced Right beating nystagmus with R gaze  1-2 beats R beating or R rotary    Smooth Pursuits Intact   Saccades Slow   Comment Convergence intact     Vestibulo-Occular Reflex   VOR to Slow Head Movement Normal   Comment HIT: +R,  - to L     Visual Acuity   Static 8   Dynamic 5     Positional Testing   Dix-Hallpike Dix-Hallpike Left;Dix-Hallpike Right     Dix-Hallpike Right   Dix-Hallpike Right Duration 0   Dix-Hallpike Right Symptoms No nystagmus     Dix-Hallpike Left   Dix-Hallpike Left Duration 0   Dix-Hallpike Left Symptoms No nystagmus  [facility] Adult PT Treatment/Exercise - 01/23/17 1157      Ambulation/Gait   Ambulation/Gait Yes   Ambulation/Gait Assistance 5: Supervision   Ambulation/Gait Assistance Details use of trekking poles to facilitate increased stability, trunk rotation and reciprocal arm swing for more coordinated LE advancement and more normal gait sequence   Ambulation Distance (Feet) 250 Feet   Assistive device Other (Comment)  trekking poles   Gait Pattern Step-through pattern   Ambulation Surface Level;Indoor         Vestibular Treatment/Exercise - 01/23/17 1132      Vestibular Treatment/Exercise   Vestibular Treatment Provided Gaze   Gaze Exercises X1 Viewing Horizontal;X1 Viewing Vertical               PT Education - 01/23/17  1159    Education provided Yes   Education Details vestibular findings, x 1 viewing, gait with trekking poles   [employer]) Educated Patient   Methods Explanation;Demonstration;Handout   Comprehension Verbalized understanding;Returned demonstration          PT Short Term Goals - 01/23/17 1206      PT SHORT TERM GOAL #1   Title Pt will report 0/10 dizziness when descending stairs/head turns during gait and will demonstrate negative positional testing    Baseline see new vestibular goal below   Time 4   Period Weeks   Status Achieved     PT SHORT TERM GOAL #2   Title Pt will improve safety with gait as indicated by increase in FGA score to > or = 19/30   Baseline 16/30   Time 4   Period Weeks   Status New   Target Date 02/18/17     PT SHORT TERM GOAL #3   Title Pt will decrease falls risk in community as indicated by increase in gait velocity to > or = to 2.5 ft/sec   Baseline decreased to 2.46 ft/sec due to dizziness   Time 4   Period Weeks   Status Revised   Target Date 02/18/17     PT SHORT TERM GOAL #4   Title Pt will demonstrate independence with gaze adaptation HEP and improved use of VOR during mobility as indicated by DVA 2 line difference   Baseline 3 line difference on 01/23/17   Status New   Target Date 02/18/17           PT Long Term Goals - 01/17/17 1544      PT LONG TERM GOAL #1   Title Pt will be independent with HEP and will demonstrate safe technique for performing quad, glute, hamstring, gastroc/DF strengthening at gym and safe use of treadmill (use of machines)   Time 8   Period Weeks   Status Revised   Target Date 03/20/17     PT LONG TERM GOAL #3   Title improve gait velocity to >/= 2.62 ft/sec for improved community access    Baseline improved 2.46 but not to target goal-limited by dizziness today   Time 8   Period Weeks   Status Revised   Target Date 03/20/17     PT LONG TERM GOAL #4   Title amb > 1,000 feet on uneven paved, curb and  grassy outdoor surfaces at MOD I for improved mobility to enter/exit work   Baseline amb >1,000 feet on uneven paved outdoor surfaces at Supervision on 8/20   Time 8   Period Weeks   Status Revised   Target Date 03/20/17     PT LONG TERM GOAL #  5   Title Pt will demonstrate safe ascending/descending A-frame ladder while carrying object up to 15lb in one hand, MOD I   Time 8   Period Weeks   Status Revised   Target Date 03/20/17     PT LONG TERM GOAL #6   Title Pt will demonstrate safe lifting techniques (up to 25 lb) from various height surfaces and will carry objects x 150' over level surfaces with improved body mechanics. (03/20/2017)   Time 8   Period Weeks   Status Revised     PT LONG TERM GOAL #7   Title Will improve FOTO Neuro QOL LE by 20 points (03/20/2017)   Baseline 40   Time 8   Period Weeks   Status On-going     PT LONG TERM GOAL #8   Title Pt will improve FGA to 20/23 in order to indicate decreased fall risk.  (03/20/2017)   Baseline 16/30 on 01/17/17   Time 8   Period Weeks   Status Revised               Plan - 01/23/17 1200    Clinical Impression Statement Due to pt's continued vestibular symptoms performed more thorough assessment of vestibular system; canals negative today for nystagmus and pt reporting no vertigo.  Pt did present with corrective saccade during head impulse test to R side and 3 line difference for DVA indicating R vestibular hypofunction (pt may have experienced an acute vestibular neuritis a few weeks ago with sudden onset of dizziness, nausea, vomiting and imbalance with L Hallpike Dix giving false positive for BPPV).  Educated pt on and provided pt with gaze adaptation exercise and discussed rationale for addressing hypofunction to avoid dizziness when performing gait at faster speeds.  Performed gait training with trekking poles to facilitate greater reciprocal arm swing and trunk rotation with pt reporting more fluid gait and faster  speed.   Rehab Potential Good   Clinical Impairments Affecting Rehab Potential recent lumbar surgery-pain and mm tightness   PT Frequency 2x / week   PT Duration 8 weeks   PT Treatment/Interventions ADLs/Self Care Home Management;Cryotherapy;Electrical Stimulation;Moist Heat;Neuromuscular re-education;Balance training;Therapeutic exercise;Therapeutic activities;Functional mobility training;Patient/family education;Gait training;DME Instruction;Stair training;Orthotic Fit/Training;Taping;Passive range of motion;Aquatic Therapy;Canalith Repostioning;Vestibular   PT Next Visit Plan *CHECK BP PRIOR TO EX* manually with XL cuff(goal BP 130/90); test gait velocity with trekking poles; trekking poles outside; treadmill faster speeds, less UE support, stairs without UE support, ladder, lifting, split jumps for power, Aquatic therapy?   Consulted and Agree with Plan of Care Patient      Patient will benefit from skilled therapeutic intervention in order to improve the following deficits and impairments:  Abnormal gait, Decreased balance, Decreased mobility, Decreased strength, Pain, Difficulty walking, Improper body mechanics, Decreased range of motion, Increased muscle spasms, Impaired flexibility, Decreased coordination, Decreased endurance, Dizziness, Impaired tone  Visit Diagnosis: Hemiplegia and hemiparesis following cerebral infarction affecting right non-dominant side (HCC)  Muscle weakness (generalized)  Other abnormalities of gait and mobility  Unsteadiness on feet     Problem List Patient Active Problem List   Diagnosis Date Noted  . Abnormality of gait as late effect of stroke 08/30/2016  . Hemiparesis affecting dominant side as late effect of cerebrovascular accident (CVA) (HCC) 08/02/2016  . Disorder of lumbosacral plexus 08/02/2016  . Small vessel disease, cerebrovascular 07/05/2016  . Right sided weakness 07/03/2016  . Facial droop 07/03/2016  . Stroke (HCC) 07/03/2016  .  Anemia   . Tachycardia   .  Back pain 06/27/2016    Edman Circle, PT, DPT 01/23/17    12:08 PM    [facility] [address] [place], [place], [address] Phone: [phone]   Fax:  [phone]  Name: JAKORI BURKETT MRN: [id number] Date of Birth: [birth year]

## 2017-01-23 NOTE — Patient Instructions (Signed)
Gaze Stabilization - Tip Card  1.Target must remain in focus, not blurry, and appear stationary while head is in motion. 2.Perform exercises with small head movements (45 to either side of midline). 3.Increase speed of head motion so long as target is in focus. 4.If you wear eyeglasses, be sure you can see target through lens (therapist will give specific instructions for bifocal / progressive lenses). 5.These exercises may provoke dizziness or nausea. Work through these symptoms. If too dizzy, slow head movement slightly. Rest between each exercise. 6.Exercises demand concentration; avoid distractions. 7.For safety, perform standing exercises close to a counter, wall, corner, or next to someone.  Copyright  VHI. All rights reserved.   Gaze Stabilization - Standing Feet Apart   Feet shoulder width apart, keeping eyes on target on wall 3 feet away, tilt head down slightly and move head side to side for 60 seconds. Repeat while moving head up and down for 60 seconds. *Work up to tolerating 2 minutes, as able. Do 2-3 sessions per day.   Copyright  VHI. All rights reserved.

## 2017-01-24 ENCOUNTER — Encounter: Payer: Self-pay | Admitting: Physical Therapy

## 2017-01-24 ENCOUNTER — Ambulatory Visit: Payer: 59 | Admitting: Physical Therapy

## 2017-01-24 VITALS — BP 150/98 | HR 114

## 2017-01-24 DIAGNOSIS — R2681 Unsteadiness on feet: Secondary | ICD-10-CM

## 2017-01-24 DIAGNOSIS — M545 Low back pain, unspecified: Secondary | ICD-10-CM

## 2017-01-24 DIAGNOSIS — M6281 Muscle weakness (generalized): Secondary | ICD-10-CM

## 2017-01-24 DIAGNOSIS — R2689 Other abnormalities of gait and mobility: Secondary | ICD-10-CM

## 2017-01-24 DIAGNOSIS — I69353 Hemiplegia and hemiparesis following cerebral infarction affecting right non-dominant side: Secondary | ICD-10-CM | POA: Diagnosis not present

## 2017-01-24 NOTE — Therapy (Signed)
[facility] [address] [place], [place], [address] Phone: [phone]   Fax:  [phone]  Physical Therapy Treatment  Patient Details  Name: Darrell Watson MRN: [id number] Date of Birth: [birth year] Referring Provider: Erick Colace, MD  Encounter Date: 01/24/2017      PT End of Session - 01/24/17 0940    Visit Number 25   Number of Visits 39  per recertification   Date for PT Re-Evaluation 03/20/17   Authorization Type [id number]-60 visit limit combined   Authorization - Visit Number 43   Authorization - Number of Visits 60   PT Start Time 0800   PT Stop Time 0847   PT Time Calculation (min) 47 min   Activity Tolerance Patient tolerated treatment well   Behavior During Therapy [facility] for tasks assessed/performed      Past Medical History:  Diagnosis Date  . Anemia   . Arthritis   . Stroke (HCC)   . Tachycardia     Past Surgical History:  Procedure Laterality Date  . ABDOMINAL EXPOSURE N/A 06/27/2016   Procedure: ABDOMINAL EXPOSURE;  Surgeon: Chuck Hint, MD;  Location: [facility] OR;  Service: Vascular;  Laterality: N/A;  . ANTERIOR LUMBAR FUSION N/A 06/27/2016   Procedure: ANTERIOR LUMBAR FUSION L5-S1;  Surgeon: Venita Lick, MD;  Location: [facility] OR;  Service: Orthopedics;  Laterality: N/A;  Requests 3 hours  . FINGER AMPUTATION Left   . SHOULDER SURGERY      Vitals:   01/24/17 0834  BP: (!) 150/98  Pulse: (!) 114        Subjective Assessment - 01/24/17 0800    Subjective No dizziness after session yesterday, no soreness or other issues to report.   Pertinent History lumbar fusion 06/27/16, L CVA with R hemiparesis   Limitations Standing;Walking   How long can you stand comfortably? 2-3 hours doing yard work-but LE fatigues quickly   How long can you walk comfortably? at least 10 min   Patient Stated Goals improve RLE strength; reports RUE strength has improved from working out at gym; wants to be able  to get back to work   Currently in Pain? No/denies                 [facility] Adult PT Treatment/Exercise - 01/24/17 0801      Ambulation/Gait   Ambulation/Gait Yes   Ambulation/Gait Assistance 4: Min guard   Ambulation/Gait Assistance Details trekking poles for gait outside over road pavement >1500' to determine pt safety and use of poles when walking outside in his neighborhood.  Required 7 minutes to perform full loop   Ambulation Distance (Feet) 1500 Feet   Assistive device Other (Comment)  trekking poles   Gait Pattern Step-through pattern;Decreased hip/knee flexion - right;Poor foot clearance - right   Ambulation Surface Unlevel;Outdoor;Paved   Gait velocity 12.94 or 2.53 ft/sec without trekking poles; 11.4 seconds or 2.8 ft/sec     Lumbar Exercises: Aerobic   Tread Mill x 8 minutes forwards at 1.5 <> 2.0 mph with improved foot clearance, equal step length but continues to demonstrate slight ER of RLE when advancing it forwards.  Also performed reverse walking on treadmill x 2 minutes at 0.7 mph with verbal cues for knee flexion with hip extension.  Mild dizziness after performing treadmill     Knee/Hip Exercises: Standing   Other Standing Knee Exercises Split jumps with bilat UE support on rail x 8 reps with increased time to sequence and coordinate  PT Education - 01/24/17 0940    Education provided Yes   Education Details gait with trekking poles outside, split jumps for power/speed   Person(s) Educated Patient   Methods Explanation;Demonstration   Comprehension Need further instruction          PT Short Term Goals - 01/23/17 1206      PT SHORT TERM GOAL #1   Title Pt will report 0/10 dizziness when descending stairs/head turns during gait and will demonstrate negative positional testing    Baseline see new vestibular goal below   Time 4   Period Weeks   Status Achieved     PT SHORT TERM GOAL #2   Title Pt will improve safety with gait as  indicated by increase in FGA score to > or = 19/30   Baseline 16/30   Time 4   Period Weeks   Status New   Target Date 02/18/17     PT SHORT TERM GOAL #3   Title Pt will decrease falls risk in community as indicated by increase in gait velocity to > or = to 2.5 ft/sec   Baseline decreased to 2.46 ft/sec due to dizziness   Time 4   Period Weeks   Status Revised   Target Date 02/18/17     PT SHORT TERM GOAL #4   Title Pt will demonstrate independence with gaze adaptation HEP and improved use of VOR during mobility as indicated by DVA 2 line difference   Baseline 3 line difference on 01/23/17   Status New   Target Date 02/18/17           PT Long Term Goals - 01/17/17 1544      PT LONG TERM GOAL #1   Title Pt will be independent with HEP and will demonstrate safe technique for performing quad, glute, hamstring, gastroc/DF strengthening at gym and safe use of treadmill (use of machines)   Time 8   Period Weeks   Status Revised   Target Date 03/20/17     PT LONG TERM GOAL #3   Title improve gait velocity to >/= 2.62 ft/sec for improved community access    Baseline improved 2.46 but not to target goal-limited by dizziness today   Time 8   Period Weeks   Status Revised   Target Date 03/20/17     PT LONG TERM GOAL #4   Title amb > 1,000 feet on uneven paved, curb and grassy outdoor surfaces at MOD I for improved mobility to enter/exit work   Baseline amb >1,000 feet on uneven paved outdoor surfaces at Supervision on 8/20   Time 8   Period Weeks   Status Revised   Target Date 03/20/17     PT LONG TERM GOAL #5   Title Pt will demonstrate safe ascending/descending A-frame ladder while carrying object up to 15lb in one hand, MOD I   Time 8   Period Weeks   Status Revised   Target Date 03/20/17     PT LONG TERM GOAL #6   Title Pt will demonstrate safe lifting techniques (up to 25 lb) from various height surfaces and will carry objects x 150' over level surfaces with  improved body mechanics. (03/20/2017)   Time 8   Period Weeks   Status Revised     PT LONG TERM GOAL #7   Title Will improve FOTO Neuro QOL LE by 20 points (03/20/2017)   Baseline 40   Time 8   Period Weeks   Status  On-going     PT LONG TERM GOAL #8   Title Pt will improve FGA to 20/23 in order to indicate decreased fall risk.  (03/20/2017)   Baseline 16/30 on 01/17/17   Time 8   Period Weeks   Status Revised               Plan - 01/24/17 1040    Clinical Impression Statement Continued to focus on gait training at faster speeds with use of treadmill and use of trekking poles for ambulation outside for long distances on pavement to simulate walking through neighborhood and from car > inside work.  Pt does demonstrate 1 sec greater gait velocity with trekking poles.  Also initiated use of split jumps to focus on power and speed of reciprocal movements.  Minimal dizziness reported after treadmill.  Will continue to address.     Rehab Potential Good   Clinical Impairments Affecting Rehab Potential recent lumbar surgery-pain and mm tightness   PT Frequency 2x / week   PT Duration 8 weeks   PT Treatment/Interventions ADLs/Self Care Home Management;Cryotherapy;Electrical Stimulation;Moist Heat;Neuromuscular re-education;Balance training;Therapeutic exercise;Therapeutic activities;Functional mobility training;Patient/family education;Gait training;DME Instruction;Stair training;Orthotic Fit/Training;Taping;Passive range of motion;Aquatic Therapy;Canalith Repostioning;Vestibular   PT Next Visit Plan *CHECK BP PRIOR TO EX* manually with XL cuff(goal BP 130/90); trekking poles outside; treadmill faster speeds and inclines, less UE support, stairs without UE support, ladder, lifting, split jumps for power, Aquatic therapy?   Consulted and Agree with Plan of Care Patient      Patient will benefit from skilled therapeutic intervention in order to improve the following deficits and  impairments:  Abnormal gait, Decreased balance, Decreased mobility, Decreased strength, Pain, Difficulty walking, Improper body mechanics, Decreased range of motion, Increased muscle spasms, Impaired flexibility, Decreased coordination, Decreased endurance, Dizziness, Impaired tone  Visit Diagnosis: Hemiplegia and hemiparesis following cerebral infarction affecting right non-dominant side (HCC)  Muscle weakness (generalized)  Other abnormalities of gait and mobility  Unsteadiness on feet  Acute midline low back pain without sciatica     Problem List Patient Active Problem List   Diagnosis Date Noted  . Abnormality of gait as late effect of stroke 08/30/2016  . Hemiparesis affecting dominant side as late effect of cerebrovascular accident (CVA) (HCC) 08/02/2016  . Disorder of lumbosacral plexus 08/02/2016  . Small vessel disease, cerebrovascular 07/05/2016  . Right sided weakness 07/03/2016  . Facial droop 07/03/2016  . Stroke (HCC) 07/03/2016  . Anemia   . Tachycardia   . Back pain 06/27/2016    Edman Circle, PT, DPT 01/24/17    10:45 AM    [facility] [address] [place], [place], [address] Phone: [phone]   Fax:  [phone]  Name: Darrell Watson MRN: [id number] Date of Birth: 07/30/1960

## 2017-01-27 ENCOUNTER — Encounter: Payer: Self-pay | Admitting: Physical Therapy

## 2017-01-27 ENCOUNTER — Ambulatory Visit: Payer: 59 | Attending: Physical Medicine & Rehabilitation | Admitting: Physical Therapy

## 2017-01-27 DIAGNOSIS — M6281 Muscle weakness (generalized): Secondary | ICD-10-CM | POA: Diagnosis present

## 2017-01-27 DIAGNOSIS — R42 Dizziness and giddiness: Secondary | ICD-10-CM | POA: Diagnosis present

## 2017-01-27 DIAGNOSIS — R2689 Other abnormalities of gait and mobility: Secondary | ICD-10-CM | POA: Insufficient documentation

## 2017-01-27 DIAGNOSIS — I69353 Hemiplegia and hemiparesis following cerebral infarction affecting right non-dominant side: Secondary | ICD-10-CM

## 2017-01-27 DIAGNOSIS — R2681 Unsteadiness on feet: Secondary | ICD-10-CM

## 2017-01-27 DIAGNOSIS — R278 Other lack of coordination: Secondary | ICD-10-CM | POA: Insufficient documentation

## 2017-01-27 DIAGNOSIS — M545 Low back pain, unspecified: Secondary | ICD-10-CM

## 2017-01-27 NOTE — Therapy (Signed)
[facility] [address] [place], [place], [address] Phone: [phone]   Fax:  [phone]  Physical Therapy Treatment  Patient Details  Name: Darrell Watson MRN: [id number] Date of Birth: 1960/06/25 Referring Provider: Erick Colace, MD  Encounter Date: 01/27/2017      PT End of Session - 01/27/17 1239    Visit Number 26   Number of Visits 39  per recertification   Date for PT Re-Evaluation 03/20/17   Authorization Type UHC-60 visit limit combined   Authorization - Visit Number 44   Authorization - Number of Visits 60   PT Start Time 1140   PT Stop Time 1227   PT Time Calculation (min) 47 min   Activity Tolerance Patient tolerated treatment well   Behavior During Therapy [facility] for tasks assessed/performed      Past Medical History:  Diagnosis Date  . Anemia   . Arthritis   . Stroke (HCC)   . Tachycardia     Past Surgical History:  Procedure Laterality Date  . ABDOMINAL EXPOSURE N/A 06/27/2016   Procedure: ABDOMINAL EXPOSURE;  Surgeon: Chuck Hint, MD;  Location: [facility] OR;  Service: Vascular;  Laterality: N/A;  . ANTERIOR LUMBAR FUSION N/A 06/27/2016   Procedure: ANTERIOR LUMBAR FUSION L5-S1;  Surgeon: Venita Lick, MD;  Location: [facility] OR;  Service: Orthopedics;  Laterality: N/A;  Requests 3 hours  . FINGER AMPUTATION Left   . SHOULDER SURGERY      There were no vitals filed for this visit.      Subjective Assessment - 01/27/17 1141    Subjective Didn't go to the gym Friday because he had to cut grass.  Was lazy over the weekend; didn't walk or do dizziness exercises.   Pertinent History lumbar fusion 06/27/16, L CVA with R hemiparesis   Limitations Standing;Walking   How long can you stand comfortably? 2-3 hours doing yard work-but LE fatigues quickly   How long can you walk comfortably? at least 10 min   Patient Stated Goals improve RLE strength; reports RUE strength has improved from working  out at gym; wants to be able to get back to work   Currently in Pain? No/denies                         [facility] Adult PT Treatment/Exercise - 01/27/17 1204      Lumbar Exercises: Aerobic   Tread Mill x 8 minutes forwards at 1.7 mph with bilat UE support while performing 10 reps head nods, turns and R/L diagonals; performed 2 minutes reverse walking on treadmill at 0.7 mph with bilat UE support for hamstring activation training.     Lumbar Exercises: Quadruped   Plank Modified plank with UE on elevated mat due to shoulder ROM limitations and pain; performed 12 reps alternating hip flexion "mountain climbers" at slower speed.  Will work to increase speed of transition.     Knee/Hip Exercises: Plyometrics   Bilateral Jumping Other (comment);10 reps   Bilateral Jumping Limitations on trampoline for power and speed of movement performed jump squats, split jumps, partial jumping jacks and jumps with feet together x 10 reps each at // bars for pt to have bilat UE support and min A for balance         Vestibular Treatment/Exercise - 01/27/17 1150      Vestibular Treatment/Exercise   Vestibular Treatment Provided Gaze   Gaze Exercises X1 Viewing Horizontal;X1 Viewing Vertical  X1 Viewing Horizontal   Foot Position feet apart, feet together   Reps 2   Comments 60 seconds each; no dizziness during exercise but noted dizziness after ceasing     X1 Viewing Vertical   Foot Position feet apart, feet together   Reps 2   Comments 60 seconds each; no dizziness during exercise but noted dizziness after ceasing               PT Education - 01/27/17 1238    Education provided Yes   Education Details mental practice of running   Person(s) Educated Patient   Methods Explanation   Comprehension Verbalized understanding          PT Short Term Goals - 01/23/17 1206      PT SHORT TERM GOAL #1   Title Pt will report 0/10 dizziness when descending stairs/head turns  during gait and will demonstrate negative positional testing    Baseline see new vestibular goal below   Time 4   Period Weeks   Status Achieved     PT SHORT TERM GOAL #2   Title Pt will improve safety with gait as indicated by increase in FGA score to > or = 19/30   Baseline 16/30   Time 4   Period Weeks   Status New   Target Date 02/18/17     PT SHORT TERM GOAL #3   Title Pt will decrease falls risk in community as indicated by increase in gait velocity to > or = to 2.5 ft/sec   Baseline decreased to 2.46 ft/sec due to dizziness   Time 4   Period Weeks   Status Revised   Target Date 02/18/17     PT SHORT TERM GOAL #4   Title Pt will demonstrate independence with gaze adaptation HEP and improved use of VOR during mobility as indicated by DVA 2 line difference   Baseline 3 line difference on 01/23/17   Status New   Target Date 02/18/17           PT Long Term Goals - 01/17/17 1544      PT LONG TERM GOAL #1   Title Pt will be independent with HEP and will demonstrate safe technique for performing quad, glute, hamstring, gastroc/DF strengthening at gym and safe use of treadmill (use of machines)   Time 8   Period Weeks   Status Revised   Target Date 03/20/17     PT LONG TERM GOAL #3   Title improve gait velocity to >/= 2.62 ft/sec for improved community access    Baseline improved 2.46 but not to target goal-limited by dizziness today   Time 8   Period Weeks   Status Revised   Target Date 03/20/17     PT LONG TERM GOAL #4   Title amb > 1,000 feet on uneven paved, curb and grassy outdoor surfaces at MOD I for improved mobility to enter/exit work   Baseline amb >1,000 feet on uneven paved outdoor surfaces at Supervision on 8/20   Time 8   Period Weeks   Status Revised   Target Date 03/20/17     PT LONG TERM GOAL #5   Title Pt will demonstrate safe ascending/descending A-frame ladder while carrying object up to 15lb in one hand, MOD I   Time 8   Period Weeks    Status Revised   Target Date 03/20/17     PT LONG TERM GOAL #6   Title Pt will demonstrate safe lifting  techniques (up to 25 lb) from various height surfaces and will carry objects x 150' over level surfaces with improved body mechanics. (03/20/2017)   Time 8   Period Weeks   Status Revised     PT LONG TERM GOAL #7   Title Will improve FOTO Neuro QOL LE by 20 points (03/20/2017)   Baseline 40   Time 8   Period Weeks   Status On-going     PT LONG TERM GOAL #8   Title Pt will improve FGA to 20/23 in order to indicate decreased fall risk.  (03/20/2017)   Baseline 16/30 on 01/17/17   Time 8   Period Weeks   Status Revised               Plan - 01/27/17 1239    Clinical Impression Statement Treatment session today continued to focus on vestibular training with standing gaze adaptation with wide and narrow BOS and gait on treadmill with head turns in various directions.  Continued hamstring training and LE strengthening for speed and plyometric power with various jumps on mini-trampoline.  Pt reports that when he attempts to imagine himself running he can't, everything is in slow motion.  Advised pt to continue mental imagery and practice of running.  Will continue to address.     Rehab Potential Good   Clinical Impairments Affecting Rehab Potential recent lumbar surgery-pain and mm tightness   PT Frequency 2x / week   PT Duration 8 weeks   PT Treatment/Interventions ADLs/Self Care Home Management;Cryotherapy;Electrical Stimulation;Moist Heat;Neuromuscular re-education;Balance training;Therapeutic exercise;Therapeutic activities;Functional mobility training;Patient/family education;Gait training;DME Instruction;Stair training;Orthotic Fit/Training;Taping;Passive range of motion;Aquatic Therapy;Canalith Repostioning;Vestibular   PT Next Visit Plan *CHECK BP PRIOR TO EX* manually with XL cuff(goal BP 130/90); trekking poles outside; trampoline jumps: jump squats, split jumps, jacks,  marching; mountain climbers UE on mat, treadmill forwards and backwards- faster speeds and inclines, less UE support, stairs without UE support, ladder, lifting, Aquatic therapy?   Consulted and Agree with Plan of Care Patient      Patient will benefit from skilled therapeutic intervention in order to improve the following deficits and impairments:  Abnormal gait, Decreased balance, Decreased mobility, Decreased strength, Pain, Difficulty walking, Improper body mechanics, Decreased range of motion, Increased muscle spasms, Impaired flexibility, Decreased coordination, Decreased endurance, Dizziness, Impaired tone  Visit Diagnosis: Hemiplegia and hemiparesis following cerebral infarction affecting right non-dominant side (HCC)  Muscle weakness (generalized)  Other abnormalities of gait and mobility  Unsteadiness on feet  Acute midline low back pain without sciatica     Problem List Patient Active Problem List   Diagnosis Date Noted  . Abnormality of gait as late effect of stroke 08/30/2016  . Hemiparesis affecting dominant side as late effect of cerebrovascular accident (CVA) (HCC) 08/02/2016  . Disorder of lumbosacral plexus 08/02/2016  . Small vessel disease, cerebrovascular 07/05/2016  . Right sided weakness 07/03/2016  . Facial droop 07/03/2016  . Stroke (HCC) 07/03/2016  . Anemia   . Tachycardia   . Back pain 06/27/2016    Edman Circle, PT, DPT 01/27/17    12:45 PM    [facility] [address] [place], [place], [address] Phone: [phone]   Fax:  [phone]  Name: ASHE GAGO MRN: [id number] Date of Birth: 1961-03-02

## 2017-01-30 ENCOUNTER — Ambulatory Visit: Payer: 59 | Admitting: Physical Therapy

## 2017-01-30 ENCOUNTER — Encounter: Payer: Self-pay | Admitting: Physical Therapy

## 2017-01-30 DIAGNOSIS — R42 Dizziness and giddiness: Secondary | ICD-10-CM

## 2017-01-30 DIAGNOSIS — R2681 Unsteadiness on feet: Secondary | ICD-10-CM

## 2017-01-30 DIAGNOSIS — M545 Low back pain, unspecified: Secondary | ICD-10-CM

## 2017-01-30 DIAGNOSIS — I69353 Hemiplegia and hemiparesis following cerebral infarction affecting right non-dominant side: Secondary | ICD-10-CM | POA: Diagnosis not present

## 2017-01-30 DIAGNOSIS — M6281 Muscle weakness (generalized): Secondary | ICD-10-CM

## 2017-01-30 DIAGNOSIS — R2689 Other abnormalities of gait and mobility: Secondary | ICD-10-CM

## 2017-01-30 NOTE — Therapy (Signed)
[facility] [address] [place], [place], [address] Phone: [phone]   Fax:  [phone]  Physical Therapy Treatment  Patient Details  Name: Darrell Watson MRN: [id number] Date of Birth: Aug 19, 1960 Referring Provider: Erick Colace, MD  Encounter Date: 01/30/2017      PT End of Session - 01/30/17 0941    Visit Number 27   Number of Visits 39  per recertification   Date for PT Re-Evaluation 03/20/17   Authorization Type UHC-60 visit limit combined   Authorization - Visit Number 45   Authorization - Number of Visits 60   PT Start Time 0848   PT Stop Time 0937   PT Time Calculation (min) 49 min   Activity Tolerance Patient tolerated treatment well   Behavior During Therapy [facility] for tasks assessed/performed      Past Medical History:  Diagnosis Date  . Anemia   . Arthritis   . Stroke (HCC)   . Tachycardia     Past Surgical History:  Procedure Laterality Date  . ABDOMINAL EXPOSURE N/A 06/27/2016   Procedure: ABDOMINAL EXPOSURE;  Surgeon: Chuck Hint, MD;  Location: [facility] OR;  Service: Vascular;  Laterality: N/A;  . ANTERIOR LUMBAR FUSION N/A 06/27/2016   Procedure: ANTERIOR LUMBAR FUSION L5-S1;  Surgeon: Venita Lick, MD;  Location: [facility] OR;  Service: Orthopedics;  Laterality: N/A;  Requests 3 hours  . FINGER AMPUTATION Left   . SHOULDER SURGERY      There were no vitals filed for this visit.      Subjective Assessment - 01/30/17 0851    Subjective Pt has been attempting to visualize walking faster or running but still having difficulty.  Hasn't performed the gaze adaptation exercise; has only noticed the dizziness a little bit when transitioning sit <> stand.     Pertinent History lumbar fusion 06/27/16, L CVA with R hemiparesis   Limitations Standing;Walking   How long can you stand comfortably? 2-3 hours doing yard work-but LE fatigues quickly   How long can you walk comfortably? at least 10 min    Patient Stated Goals improve RLE strength; reports RUE strength has improved from working out at gym; wants to be able to get back to work   Currently in Pain? No/denies                         [facility] Adult PT Treatment/Exercise - 01/30/17 0906      Ambulation/Gait   Ambulation/Gait Yes   Ambulation/Gait Assistance 4: Min guard   Ambulation/Gait Assistance Details trekking poles for gait outside over road pavement >1500' still with increased foot drag when going downhill and notable fatigue afterwards.  6:55 to perform today   Ambulation Distance (Feet) 1500 Feet   Assistive device Other (Comment)   Gait Pattern Step-through pattern;Decreased hip/knee flexion - right;Poor foot clearance - right   Ambulation Surface Unlevel;Outdoor;Paved     Knee/Hip Exercises: Aerobic   Elliptical LE at level 2 on random resistance x 6 minutes forwards, 2 minutes backwards         Vestibular Treatment/Exercise - 01/30/17 0855      Vestibular Treatment/Exercise   Vestibular Treatment Provided Gaze   Habituation Exercises --   Gaze Exercises X1 Viewing Horizontal;X1 Viewing Vertical     X1 Viewing Horizontal   Foot Position feet apart, feet together solid surface; feet apart compliant surface   Reps 3   Comments 60 seconds each; dizziness reported  with feet together     X1 Viewing Vertical   Foot Position feet apart, feet together solid surface; feet apart compliant surface   Reps 3   Comments 60 seconds each; dizziness reported with feet together               PT Education - 01/30/17 0941    Education provided Yes   Education Details continue mental practice, gaze adaptation and endurance training   Person(s) Educated Patient   Methods Explanation   Comprehension Verbalized understanding          PT Short Term Goals - 01/23/17 1206      PT SHORT TERM GOAL #1   Title Pt will report 0/10 dizziness when descending stairs/head turns during gait and will  demonstrate negative positional testing    Baseline see new vestibular goal below   Time 4   Period Weeks   Status Achieved     PT SHORT TERM GOAL #2   Title Pt will improve safety with gait as indicated by increase in FGA score to > or = 19/30   Baseline 16/30   Time 4   Period Weeks   Status New   Target Date 02/18/17     PT SHORT TERM GOAL #3   Title Pt will decrease falls risk in community as indicated by increase in gait velocity to > or = to 2.5 ft/sec   Baseline decreased to 2.46 ft/sec due to dizziness   Time 4   Period Weeks   Status Revised   Target Date 02/18/17     PT SHORT TERM GOAL #4   Title Pt will demonstrate independence with gaze adaptation HEP and improved use of VOR during mobility as indicated by DVA 2 line difference   Baseline 3 line difference on 01/23/17   Status New   Target Date 02/18/17           PT Long Term Goals - 01/17/17 1544      PT LONG TERM GOAL #1   Title Pt will be independent with HEP and will demonstrate safe technique for performing quad, glute, hamstring, gastroc/DF strengthening at gym and safe use of treadmill (use of machines)   Time 8   Period Weeks   Status Revised   Target Date 03/20/17     PT LONG TERM GOAL #3   Title improve gait velocity to >/= 2.62 ft/sec for improved community access    Baseline improved 2.46 but not to target goal-limited by dizziness today   Time 8   Period Weeks   Status Revised   Target Date 03/20/17     PT LONG TERM GOAL #4   Title amb > 1,000 feet on uneven paved, curb and grassy outdoor surfaces at MOD I for improved mobility to enter/exit work   Baseline amb >1,000 feet on uneven paved outdoor surfaces at Supervision on 8/20   Time 8   Period Weeks   Status Revised   Target Date 03/20/17     PT LONG TERM GOAL #5   Title Pt will demonstrate safe ascending/descending A-frame ladder while carrying object up to 15lb in one hand, MOD I   Time 8   Period Weeks   Status Revised    Target Date 03/20/17     PT LONG TERM GOAL #6   Title Pt will demonstrate safe lifting techniques (up to 25 lb) from various height surfaces and will carry objects x 150' over level surfaces with improved body  mechanics. (03/20/2017)   Time 8   Period Weeks   Status Revised     PT LONG TERM GOAL #7   Title Will improve FOTO Neuro QOL LE by 20 points (03/20/2017)   Baseline 40   Time 8   Period Weeks   Status On-going     PT LONG TERM GOAL #8   Title Pt will improve FGA to 20/23 in order to indicate decreased fall risk.  (03/20/2017)   Baseline 16/30 on 01/17/17   Time 8   Period Weeks   Status Revised               Plan - 01/30/17 0941    Clinical Impression Statement Continued to focus on progressing gaze adaptation exercises to incorporate compliant surface and increasing speed of head movement.  Also continued to focus on NMR for reciprocal UE and LE movement for more fluid gait sequence with elliptical and gait outside with trekking poles.  Pt continues to report little to no progress with increasing weight resistance for LE exercises at gym and continues to report significant fatigue after performing a lot of exercises/activities with LE.  Will continue to address endurance, coordination, strengthening to progress towards goals.   Rehab Potential Good   Clinical Impairments Affecting Rehab Potential recent lumbar surgery-pain and mm tightness   PT Frequency 2x / week   PT Duration 8 weeks   PT Treatment/Interventions ADLs/Self Care Home Management;Cryotherapy;Electrical Stimulation;Moist Heat;Neuromuscular re-education;Balance training;Therapeutic exercise;Therapeutic activities;Functional mobility training;Patient/family education;Gait training;DME Instruction;Stair training;Orthotic Fit/Training;Taping;Passive range of motion;Aquatic Therapy;Canalith Repostioning;Vestibular   PT Next Visit Plan *CHECK BP PRIOR TO EX* manually with XL cuff(goal BP 130/90); trekking poles  outside; trampoline jumps: jump squats, split jumps, jacks, marching; mountain climbers UE on mat, treadmill forwards and backwards- faster speeds and inclines, less UE support, stairs without UE support, ladder, lifting, Aquatic therapy?   Recommended Other Services dry needling for tone?   Consulted and Agree with Plan of Care Patient      Patient will benefit from skilled therapeutic intervention in order to improve the following deficits and impairments:  Abnormal gait, Decreased balance, Decreased mobility, Decreased strength, Pain, Difficulty walking, Improper body mechanics, Decreased range of motion, Increased muscle spasms, Impaired flexibility, Decreased coordination, Decreased endurance, Dizziness, Impaired tone  Visit Diagnosis: Hemiplegia and hemiparesis following cerebral infarction affecting right non-dominant side (HCC)  Muscle weakness (generalized)  Other abnormalities of gait and mobility  Unsteadiness on feet  Acute midline low back pain without sciatica  Dizziness and giddiness     Problem List Patient Active Problem List   Diagnosis Date Noted  . Abnormality of gait as late effect of stroke 08/30/2016  . Hemiparesis affecting dominant side as late effect of cerebrovascular accident (CVA) (HCC) 08/02/2016  . Disorder of lumbosacral plexus 08/02/2016  . Small vessel disease, cerebrovascular 07/05/2016  . Right sided weakness 07/03/2016  . Facial droop 07/03/2016  . Stroke (HCC) 07/03/2016  . Anemia   . Tachycardia   . Back pain 06/27/2016    Edman Circle, PT, DPT 01/30/17    9:46 AM    [facility] [address] [place], [place], [address] Phone: [phone]   Fax:  [phone]  Name: QUAMEL FITZMAURICE MRN: [id number] Date of Birth: 1961/04/23

## 2017-02-03 ENCOUNTER — Ambulatory Visit: Payer: Self-pay | Admitting: Physical Therapy

## 2017-02-04 ENCOUNTER — Ambulatory Visit: Payer: 59 | Admitting: Physical Therapy

## 2017-02-04 ENCOUNTER — Encounter: Payer: Self-pay | Admitting: Physical Therapy

## 2017-02-04 DIAGNOSIS — R2681 Unsteadiness on feet: Secondary | ICD-10-CM

## 2017-02-04 DIAGNOSIS — I69353 Hemiplegia and hemiparesis following cerebral infarction affecting right non-dominant side: Secondary | ICD-10-CM

## 2017-02-04 DIAGNOSIS — E291 Testicular hypofunction: Secondary | ICD-10-CM | POA: Diagnosis not present

## 2017-02-04 DIAGNOSIS — M545 Low back pain, unspecified: Secondary | ICD-10-CM

## 2017-02-04 DIAGNOSIS — R2689 Other abnormalities of gait and mobility: Secondary | ICD-10-CM

## 2017-02-04 DIAGNOSIS — M6281 Muscle weakness (generalized): Secondary | ICD-10-CM

## 2017-02-04 NOTE — Therapy (Signed)
[facility] [address] [place], [place], [address] Phone: [phone]   Fax:  [phone]  Physical Therapy Treatment  Patient Details  Name: Darrell Watson MRN: [id number] Date of Birth: 07/19/1960 Referring Provider: Erick Colace, MD  Encounter Date: 02/04/2017      PT End of Session - 02/04/17 2011    Visit Number 28   Number of Visits 39  per recertification   Date for PT Re-Evaluation 03/20/17   Authorization Type UHC-60 visit limit combined   Authorization - Visit Number 46   Authorization - Number of Visits 60   PT Start Time 0930   PT Stop Time 1019   PT Time Calculation (min) 49 min   Activity Tolerance Patient tolerated treatment well   Behavior During Therapy [facility] for tasks assessed/performed      Past Medical History:  Diagnosis Date  . Anemia   . Arthritis   . Stroke (HCC)   . Tachycardia     Past Surgical History:  Procedure Laterality Date  . ABDOMINAL EXPOSURE N/A 06/27/2016   Procedure: ABDOMINAL EXPOSURE;  Surgeon: Chuck Hint, MD;  Location: [facility] OR;  Service: Vascular;  Laterality: N/A;  . ANTERIOR LUMBAR FUSION N/A 06/27/2016   Procedure: ANTERIOR LUMBAR FUSION L5-S1;  Surgeon: Venita Lick, MD;  Location: [facility] OR;  Service: Orthopedics;  Laterality: N/A;  Requests 3 hours  . FINGER AMPUTATION Left   . SHOULDER SURGERY      There were no vitals filed for this visit.      Subjective Assessment - 02/04/17 0934    Subjective Pt did a lot of walking this weekend when he went to see his grandkids.  Had to walk slower but no trips or falls.  Fatigued when he return, slept a lot when he got home.   Back pain continues to be improved.   Pertinent History lumbar fusion 06/27/16, L CVA with R hemiparesis   Limitations Standing;Walking   How long can you stand comfortably? 2-3 hours doing yard work-but LE fatigues quickly   How long can you walk comfortably? at least 10 min   Patient Stated Goals improve RLE strength; reports RUE strength has improved from working out at gym; wants to be able to get back to work   Currently in Pain? No/denies            [facility] PT Assessment - 02/04/17 0939      Tone   Assessment Location Right Lower Extremity;Left Lower Extremity     Flexibility   Soft Tissue Assessment /Muscle Length yes   Hamstrings LLE: 50 deg hip flexion with knee straight; RLE: 58 deg hip flexion with knee straight     Special Tests    Special Tests Hip Special Tests   Hip Special Tests  Maisie Fus Test;Ober's Test     [facility] Test    Findings Positive   Side Left   Comments LLE: lacks 35 deg to neutral extension; RLE: lacking 10 deg to neutral extension     Ober's Test   Findings Negative   Side Right;Left     RLE Tone   RLE Tone Mild;Hypertonic;Modified Ashworth     RLE Tone   Hypertonic Details 3-4 beats clonus at ankle; mild catch in hamstring mm   Modified Ashworth Scale for Grading Hypertonia RLE Slight increase in muscle tone, manifested by a catch and release or by minimal resistance at the end of the range of motion when the affected  part(s) is moved in flexion or extension     LLE Tone   LLE Tone Mild;Hypertonic;Other (comment);Modified Ashworth  2 beats clonus at ankle     LLE Tone   Hypertonic Details 2 beats clonus at ankle; mild catch in hamstring mm   Modified Ashworth Scale for Grading Hypertonia LLE Slight increase in muscle tone, manifested by a catch, followed by minimal resistance throughout the remainder (less than half) of the ROM                     [facility] Adult PT Treatment/Exercise - 02/04/17 1001      Lumbar Exercises: Stretches   Active Hamstring Stretch 1 rep;30 seconds  R and LLE seated with heel on stool   Active Hamstring Stretch Limitations R and LLE     Knee/Hip Exercises: Stretches   Hip Flexor Stretch Right;Left;2 reps;30 seconds   Hip Flexor Stretch Limitations standing with UE support  on counter in tall lunge position, leaning posterior       Chair Sitting    Sit at edge of seat, spine straight, one leg extended. Put a hand on each thigh and bend forward from the hip, keeping spine straight. Allow hand on extended leg to reach toward toes. Support upper body with other arm. Hold _30__ seconds. Repeat _2__ times per leg. Do __2_ sessions per day.  Hip Flexor Standing    Stand, left foot behind, heel on floor and turned slightly out, leg straight, forward leg bent. Move hips forward and then lean up and away from L side.  Should feel a stretch in front of left hip. Hold __30_ seconds. Repeat __2_ times per leg. Do _2__ sessions per day.  Copyright  VHI. All rights reserved.            PT Education - 02/04/17 2010    Education provided Yes   Education Details HEP for stretching hip flexor and hamstring, possible dry needling of tight hip flexor and hamstrings   Person(s) Educated Patient   Methods Explanation;Handout   Comprehension Verbalized understanding;Returned demonstration          PT Short Term Goals - 01/23/17 1206      PT SHORT TERM GOAL #1   Title Pt will report 0/10 dizziness when descending stairs/head turns during gait and will demonstrate negative positional testing    Baseline see new vestibular goal below   Time 4   Period Weeks   Status Achieved     PT SHORT TERM GOAL #2   Title Pt will improve safety with gait as indicated by increase in FGA score to > or = 19/30   Baseline 16/30   Time 4   Period Weeks   Status New   Target Date 02/18/17     PT SHORT TERM GOAL #3   Title Pt will decrease falls risk in community as indicated by increase in gait velocity to > or = to 2.5 ft/sec   Baseline decreased to 2.46 ft/sec due to dizziness   Time 4   Period Weeks   Status Revised   Target Date 02/18/17     PT SHORT TERM GOAL #4   Title Pt will demonstrate independence with gaze adaptation HEP and improved use of VOR during  mobility as indicated by DVA 2 line difference   Baseline 3 line difference on 01/23/17   Status New   Target Date 02/18/17           PT Long  Term Goals - 01/17/17 1544      PT LONG TERM GOAL #1   Title Pt will be independent with HEP and will demonstrate safe technique for performing quad, glute, hamstring, gastroc/DF strengthening at gym and safe use of treadmill (use of machines)   Time 8   Period Weeks   Status Revised   Target Date 03/20/17     PT LONG TERM GOAL #3   Title improve gait velocity to >/= 2.62 ft/sec for improved community access    Baseline improved 2.46 but not to target goal-limited by dizziness today   Time 8   Period Weeks   Status Revised   Target Date 03/20/17     PT LONG TERM GOAL #4   Title amb > 1,000 feet on uneven paved, curb and grassy outdoor surfaces at MOD I for improved mobility to enter/exit work   Baseline amb >1,000 feet on uneven paved outdoor surfaces at Supervision on 8/20   Time 8   Period Weeks   Status Revised   Target Date 03/20/17     PT LONG TERM GOAL #5   Title Pt will demonstrate safe ascending/descending A-frame ladder while carrying object up to 15lb in one hand, MOD I   Time 8   Period Weeks   Status Revised   Target Date 03/20/17     PT LONG TERM GOAL #6   Title Pt will demonstrate safe lifting techniques (up to 25 lb) from various height surfaces and will carry objects x 150' over level surfaces with improved body mechanics. (03/20/2017)   Time 8   Period Weeks   Status Revised     PT LONG TERM GOAL #7   Title Will improve FOTO Neuro QOL LE by 20 points (03/20/2017)   Baseline 40   Time 8   Period Weeks   Status On-going     PT LONG TERM GOAL #8   Title Pt will improve FGA to 20/23 in order to indicate decreased fall risk.  (03/20/2017)   Baseline 16/30 on 01/17/17   Time 8   Period Weeks   Status Revised               Plan - 02/04/17 2012    Clinical Impression Statement Performed more  thorough assessment of increased tone and tightness in bilat LE; mild hypertonicity noted in bilat LE but significant tightness and decreased ROM noted in bilat hamstring mm and L hip flexor.  Provided pt with stretches to perform at home for bilat hamstrings and hip flexor mm.  Will continue to discuss possible dry needling for these areas of decreased ROM.  No reports of dizziness today.  Will continue to address to progress towards LTG.   Rehab Potential Good   Clinical Impairments Affecting Rehab Potential recent lumbar surgery-pain and mm tightness   PT Frequency 2x / week   PT Duration 8 weeks   PT Treatment/Interventions ADLs/Self Care Home Management;Cryotherapy;Electrical Stimulation;Moist Heat;Neuromuscular re-education;Balance training;Therapeutic exercise;Therapeutic activities;Functional mobility training;Patient/family education;Gait training;DME Instruction;Stair training;Orthotic Fit/Training;Taping;Passive range of motion;Aquatic Therapy;Canalith Repostioning;Vestibular   PT Next Visit Plan *CHECK BP PRIOR TO EX* manually with XL cuff(goal BP 130/90);review LE stretches, dry needling?, gaze adaptation (dizziness?), trampoline jumps: jump squats, split jumps, jacks, marching; mountain climbers UE on mat, treadmill forwards and backwards- faster speeds and inclines, less UE support, stairs without UE support, ladder, lifting, Aquatic therapy?   Consulted and Agree with Plan of Care Patient      Patient will benefit from skilled  therapeutic intervention in order to improve the following deficits and impairments:  Abnormal gait, Decreased balance, Decreased mobility, Decreased strength, Pain, Difficulty walking, Improper body mechanics, Decreased range of motion, Increased muscle spasms, Impaired flexibility, Decreased coordination, Decreased endurance, Dizziness, Impaired tone  Visit Diagnosis: Hemiplegia and hemiparesis following cerebral infarction affecting right non-dominant side  (HCC)  Muscle weakness (generalized)  Other abnormalities of gait and mobility  Unsteadiness on feet  Acute midline low back pain without sciatica     Problem List Patient Active Problem List   Diagnosis Date Noted  . Abnormality of gait as late effect of stroke 08/30/2016  . Hemiparesis affecting dominant side as late effect of cerebrovascular accident (CVA) (HCC) 08/02/2016  . Disorder of lumbosacral plexus 08/02/2016  . Small vessel disease, cerebrovascular 07/05/2016  . Right sided weakness 07/03/2016  . Facial droop 07/03/2016  . Stroke (HCC) 07/03/2016  . Anemia   . Tachycardia   . Back pain 06/27/2016    Edman Circle, PT, DPT 02/04/17    8:19 PM    [facility] [address] [place], [place], [address] Phone: [phone]   Fax:  [phone]  Name: Darrell Watson MRN: [id number] Date of Birth: [birth year]

## 2017-02-04 NOTE — Patient Instructions (Signed)
Chair Sitting    Sit at edge of seat, spine straight, one leg extended. Put a hand on each thigh and bend forward from the hip, keeping spine straight. Allow hand on extended leg to reach toward toes. Support upper body with other arm. Hold _30__ seconds. Repeat _2__ times per leg. Do __2_ sessions per day.  Hip Flexor Standing    Stand, left foot behind, heel on floor and turned slightly out, leg straight, forward leg bent. Move hips forward and then lean up and away from L side.  Should feel a stretch in front of left hip. Hold __30_ seconds. Repeat __2_ times per leg. Do _2__ sessions per day.  Copyright  VHI. All rights reserved.

## 2017-02-05 ENCOUNTER — Ambulatory Visit: Payer: 59 | Admitting: Physical Therapy

## 2017-02-05 ENCOUNTER — Encounter: Payer: Self-pay | Admitting: Physical Therapy

## 2017-02-05 VITALS — BP 152/82

## 2017-02-05 DIAGNOSIS — R2681 Unsteadiness on feet: Secondary | ICD-10-CM

## 2017-02-05 DIAGNOSIS — I69353 Hemiplegia and hemiparesis following cerebral infarction affecting right non-dominant side: Secondary | ICD-10-CM | POA: Diagnosis not present

## 2017-02-05 DIAGNOSIS — M6281 Muscle weakness (generalized): Secondary | ICD-10-CM

## 2017-02-05 DIAGNOSIS — R2689 Other abnormalities of gait and mobility: Secondary | ICD-10-CM

## 2017-02-06 NOTE — Therapy (Signed)
[facility] [address] [place], [place], [address] Phone: [phone]   Fax:  [phone]  Physical Therapy Treatment  Patient Details  Name: Darrell Watson MRN: [id number] Date of Birth: 1960-11-04 Referring Provider: Erick Colace, MD  Encounter Date: 02/05/2017      PT End of Session - 02/05/17 0940    Visit Number 29   Number of Visits 39  per recertification   Date for PT Re-Evaluation 03/20/17   Authorization Type [id number]-60 visit limit combined   Authorization - Visit Number 47   Authorization - Number of Visits 60   PT Start Time 0932   PT Stop Time 1015   PT Time Calculation (min) 43 min   Equipment Utilized During Treatment Gait belt   Activity Tolerance Patient tolerated treatment well   Behavior During Therapy WFL for tasks assessed/performed      Past Medical History:  Diagnosis Date  . Anemia   . Arthritis   . Stroke (HCC)   . Tachycardia     Past Surgical History:  Procedure Laterality Date  . ABDOMINAL EXPOSURE N/A 06/27/2016   Procedure: ABDOMINAL EXPOSURE;  Surgeon: Chuck Hint, MD;  Location: [facility] OR;  Service: Vascular;  Laterality: N/A;  . ANTERIOR LUMBAR FUSION N/A 06/27/2016   Procedure: ANTERIOR LUMBAR FUSION L5-S1;  Surgeon: Venita Lick, MD;  Location: [facility] OR;  Service: Orthopedics;  Laterality: N/A;  Requests 3 hours  . FINGER AMPUTATION Left   . SHOULDER SURGERY      Vitals:   02/05/17 0934 02/05/17 1013  BP: (!) 142/78 (!) 152/82        Subjective Assessment - 02/05/17 0934    Subjective No nw complaints. No falls or pain to report.    Pertinent History lumbar fusion 06/27/16, L CVA with R hemiparesis   Limitations Standing;Walking   How long can you stand comfortably? 2-3 hours doing yard work-but LE fatigues quickly   How long can you walk comfortably? at least 10 min   Patient Stated Goals improve RLE strength; reports RUE strength has improved from working  out at gym; wants to be able to get back to work   Currently in Pain? Yes   Pain Score 1    Pain Location Back   Pain Orientation Lower   Pain Descriptors / Indicators Aching;Sore   Pain Type Chronic pain   Pain Onset More than a month ago   Pain Frequency Intermittent   Aggravating Factors  mostly when going from sitting to standing   Pain Relieving Factors stretches            [facility] Adult PT Treatment/Exercise - 02/05/17 0945      Neuro Re-ed    Neuro Re-ed Details  in parallel bars with mirror feedback at both sides: blocked pracitice on forward skipping with emphasis on push/toe off, posture, weight shifitng, knee flexion to assist with push off and step length. progressed to use of theraband strips in parallel bars to work on fast stepping>light "jogging" with cues as stated with skipping. min guard assist for balance with UE support on bars. pt with jerky, stiff movements that did improve mildly in fluidity as reps progressed in bars.       Exercises   Other Exercises  progressed hip flexor stretch at counter into warrior II poses each time with cues on form/technique with 4-5 breath holds x 2 reps each side.  modified triangle pose with UE's supported on mat table,  pt with limited UE reach with trunk rotation due to shoulder issues, 3 reps each side for 4-5 breath holds;                     Lumbar Exercises: Stretches   Active Hamstring Stretch 3 reps;30 seconds;Limitations   Active Hamstring Stretch Limitations 3 reps each leg with cues on hold times and ex form (straight back and to maintain knee extension)     Knee/Hip Exercises: Stretches   Hip Flexor Stretch Right;Left;2 reps;30 seconds   Hip Flexor Stretch Limitations standing with UE support on counter in tall lunge position, leaning posterior             PT Short Term Goals - 01/23/17 1206      PT SHORT TERM GOAL #1   Title Pt will report 0/10 dizziness when descending stairs/head turns during gait and will  demonstrate negative positional testing    Baseline see new vestibular goal below   Time 4   Period Weeks   Status Achieved     PT SHORT TERM GOAL #2   Title Pt will improve safety with gait as indicated by increase in FGA score to > or = 19/30   Baseline 16/30   Time 4   Period Weeks   Status New   Target Date 02/18/17     PT SHORT TERM GOAL #3   Title Pt will decrease falls risk in community as indicated by increase in gait velocity to > or = to 2.5 ft/sec   Baseline decreased to 2.46 ft/sec due to dizziness   Time 4   Period Weeks   Status Revised   Target Date 02/18/17     PT SHORT TERM GOAL #4   Title Pt will demonstrate independence with gaze adaptation HEP and improved use of VOR during mobility as indicated by DVA 2 line difference   Baseline 3 line difference on 01/23/17   Status New   Target Date 02/18/17           PT Long Term Goals - 01/17/17 1544      PT LONG TERM GOAL #1   Title Pt will be independent with HEP and will demonstrate safe technique for performing quad, glute, hamstring, gastroc/DF strengthening at gym and safe use of treadmill (use of machines)   Time 8   Period Weeks   Status Revised   Target Date 03/20/17     PT LONG TERM GOAL #3   Title improve gait velocity to >/= 2.62 ft/sec for improved community access    Baseline improved 2.46 but not to target goal-limited by dizziness today   Time 8   Period Weeks   Status Revised   Target Date 03/20/17     PT LONG TERM GOAL #4   Title amb > 1,000 feet on uneven paved, curb and grassy outdoor surfaces at MOD I for improved mobility to enter/exit work   Baseline amb >1,000 feet on uneven paved outdoor surfaces at Supervision on 8/20   Time 8   Period Weeks   Status Revised   Target Date 03/20/17     PT LONG TERM GOAL #5   Title Pt will demonstrate safe ascending/descending A-frame ladder while carrying object up to 15lb in one hand, MOD I   Time 8   Period Weeks   Status Revised    Target Date 03/20/17     PT LONG TERM GOAL #6   Title Pt will demonstrate safe  lifting techniques (up to 25 lb) from various height surfaces and will carry objects x 150' over level surfaces with improved body mechanics. (03/20/2017)   Time 8   Period Weeks   Status Revised     PT LONG TERM GOAL #7   Title Will improve FOTO Neuro QOL LE by 20 points (03/20/2017)   Baseline 40   Time 8   Period Weeks   Status On-going     PT LONG TERM GOAL #8   Title Pt will improve FGA to 20/23 in order to indicate decreased fall risk.  (03/20/2017)   Baseline 16/30 on 01/17/17   Time 8   Period Weeks   Status Revised               Plan - 02/05/17 0940    Clinical Impression Statement Today's skilled session continued to address overall stretching and LE strengthening in standing. Also began to incorporate pre-jogging activities to address balance, step length, weight shifitng and movement fluidity. Pt is progressing toward goals and should benefit from continued PT to progress toward unmet goals.           Rehab Potential Good   Clinical Impairments Affecting Rehab Potential recent lumbar surgery-pain and mm tightness   PT Frequency 2x / week   PT Duration 8 weeks   PT Treatment/Interventions ADLs/Self Care Home Management;Cryotherapy;Electrical Stimulation;Moist Heat;Neuromuscular re-education;Balance training;Therapeutic exercise;Therapeutic activities;Functional mobility training;Patient/family education;Gait training;DME Instruction;Stair training;Orthotic Fit/Training;Taping;Passive range of motion;Aquatic Therapy;Canalith Repostioning;Vestibular   PT Next Visit Plan *CHECK BP PRIOR TO EX* manually with XL cuff(goal BP 130/90);review LE stretches, dry needling?, gaze adaptation (dizziness?), trampoline jumps: jump squats, split jumps, jacks, marching; mountain climbers UE on mat, treadmill forwards and backwards- faster speeds and inclines, less UE support, stairs without UE support,  ladder, lifting, Aquatic therapy?   Consulted and Agree with Plan of Care Patient      Patient will benefit from skilled therapeutic intervention in order to improve the following deficits and impairments:  Abnormal gait, Decreased balance, Decreased mobility, Decreased strength, Pain, Difficulty walking, Improper body mechanics, Decreased range of motion, Increased muscle spasms, Impaired flexibility, Decreased coordination, Decreased endurance, Dizziness, Impaired tone  Visit Diagnosis: Hemiplegia and hemiparesis following cerebral infarction affecting right non-dominant side (HCC)  Muscle weakness (generalized)  Other abnormalities of gait and mobility  Unsteadiness on feet     Problem List Patient Active Problem List   Diagnosis Date Noted  . Abnormality of gait as late effect of stroke 08/30/2016  . Hemiparesis affecting dominant side as late effect of cerebrovascular accident (CVA) (HCC) 08/02/2016  . Disorder of lumbosacral plexus 08/02/2016  . Small vessel disease, cerebrovascular 07/05/2016  . Right sided weakness 07/03/2016  . Facial droop 07/03/2016  . Stroke (HCC) 07/03/2016  . Anemia   . Tachycardia   . Back pain 06/27/2016    Sallyanne Kuster, PTA, [facility] [address] [place], [place] [address] [phone] 02/06/17, 10:07 AM   Name: DAYVEN LINSLEY MRN: [id number] Date of Birth: March 10, 1961

## 2017-02-10 ENCOUNTER — Ambulatory Visit: Payer: 59 | Admitting: Physical Therapy

## 2017-02-10 ENCOUNTER — Encounter: Payer: Self-pay | Admitting: Physical Therapy

## 2017-02-10 VITALS — BP 144/88

## 2017-02-10 DIAGNOSIS — M6281 Muscle weakness (generalized): Secondary | ICD-10-CM

## 2017-02-10 DIAGNOSIS — M545 Low back pain, unspecified: Secondary | ICD-10-CM

## 2017-02-10 DIAGNOSIS — I69353 Hemiplegia and hemiparesis following cerebral infarction affecting right non-dominant side: Secondary | ICD-10-CM | POA: Diagnosis not present

## 2017-02-10 DIAGNOSIS — R2689 Other abnormalities of gait and mobility: Secondary | ICD-10-CM

## 2017-02-10 DIAGNOSIS — R2681 Unsteadiness on feet: Secondary | ICD-10-CM

## 2017-02-10 NOTE — Therapy (Signed)
[facility] [address] [place], [place], [address] Phone: [phone]   Fax:  [phone]  Physical Therapy Treatment  Patient Details  Name: Darrell Watson MRN: [id number] Date of Birth: [birth year] Referring Provider: Erick Colace, MD  Encounter Date: 02/10/2017      PT End of Session - 02/10/17 1218    Visit Number 30   Number of Visits 39  per recertification   Date for PT Re-Evaluation 03/20/17   Authorization Type UHC-60 visit limit combined   Authorization - Visit Number 48   Authorization - Number of Visits 60   PT Start Time 0821   PT Stop Time 0848   PT Time Calculation (min) 27 min   Activity Tolerance Patient tolerated treatment well   Behavior During Therapy [facility] for tasks assessed/performed      Past Medical History:  Diagnosis Date  . Anemia   . Arthritis   . Stroke (HCC)   . Tachycardia     Past Surgical History:  Procedure Laterality Date  . ABDOMINAL EXPOSURE N/A 06/27/2016   Procedure: ABDOMINAL EXPOSURE;  Surgeon: Chuck Hint, MD;  Location: [facility] OR;  Service: Vascular;  Laterality: N/A;  . ANTERIOR LUMBAR FUSION N/A 06/27/2016   Procedure: ANTERIOR LUMBAR FUSION L5-S1;  Surgeon: Venita Lick, MD;  Location: [facility] OR;  Service: Orthopedics;  Laterality: N/A;  Requests 3 hours  . FINGER AMPUTATION Left   . SHOULDER SURGERY      Vitals:   02/10/17 0822  BP: (!) 144/88        Subjective Assessment - 02/10/17 0822    Subjective No falls or issues. No back pain from last session but LE fatigue.    Pertinent History lumbar fusion 06/27/16, L CVA with R hemiparesis   Limitations Standing;Walking   Currently in Pain? No/denies     Therapeutic Activities: Standing Yoga stretches Warrior I & II with verbal cues to hold longer. Pt holding 3-5 sec & PT increased to 15-30 sec.  Counter support anterior lean: mountain climb motion BLEs 15 reps ea. Slow, guarded movements.  NuStep  recumbent stepper with seat 10 for maximal LE motion, Level 5 for 5 min with 30sec bouts ~70spm & 30 sec bouts >90 spm.  In parallel bars with UE support: bilateral forward jump with verbal cues on motion / LE movements & UE to catch bars and skipping with slow motions, verbal cues & UE support.   Gait Training: Treadmill 5 min comfortable speed 1. initially increased to 2.37mph during sessions; 30 sec bouts increased speed initially 2. up to 2.4 mph.                                PT Short Term Goals - 01/23/17 1206      PT SHORT TERM GOAL #1   Title Pt will report 0/10 dizziness when descending stairs/head turns during gait and will demonstrate negative positional testing    Baseline see new vestibular goal below   Time 4   Period Weeks   Status Achieved     PT SHORT TERM GOAL #2   Title Pt will improve safety with gait as indicated by increase in FGA score to > or = 19/30   Baseline 16/30   Time 4   Period Weeks   Status New   Target Date 02/18/17     PT SHORT TERM GOAL #3   Title  Pt will decrease falls risk in community as indicated by increase in gait velocity to > or = to 2.5 ft/sec   Baseline decreased to 2.46 ft/sec due to dizziness   Time 4   Period Weeks   Status Revised   Target Date 02/18/17     PT SHORT TERM GOAL #4   Title Pt will demonstrate independence with gaze adaptation HEP and improved use of VOR during mobility as indicated by DVA 2 line difference   Baseline 3 line difference on 01/23/17   Status New   Target Date 02/18/17           PT Long Term Goals - 01/17/17 1544      PT LONG TERM GOAL #1   Title Pt will be independent with HEP and will demonstrate safe technique for performing quad, glute, hamstring, gastroc/DF strengthening at gym and safe use of treadmill (use of machines)   Time 8   Period Weeks   Status Revised   Target Date 03/20/17     PT LONG TERM GOAL #3   Title improve gait velocity to >/= 2.62  ft/sec for improved community access    Baseline improved 2.46 but not to target goal-limited by dizziness today   Time 8   Period Weeks   Status Revised   Target Date 03/20/17     PT LONG TERM GOAL #4   Title amb > 1,000 feet on uneven paved, curb and grassy outdoor surfaces at MOD I for improved mobility to enter/exit work   Baseline amb >1,000 feet on uneven paved outdoor surfaces at Supervision on 8/20   Time 8   Period Weeks   Status Revised   Target Date 03/20/17     PT LONG TERM GOAL #5   Title Pt will demonstrate safe ascending/descending A-frame ladder while carrying object up to 15lb in one hand, MOD I   Time 8   Period Weeks   Status Revised   Target Date 03/20/17     PT LONG TERM GOAL #6   Title Pt will demonstrate safe lifting techniques (up to 25 lb) from various height surfaces and will carry objects x 150' over level surfaces with improved body mechanics. (03/20/2017)   Time 8   Period Weeks   Status Revised     PT LONG TERM GOAL #7   Title Will improve FOTO Neuro QOL LE by 20 points (03/20/2017)   Baseline 40   Time 8   Period Weeks   Status On-going     PT LONG TERM GOAL #8   Title Pt will improve FGA to 20/23 in order to indicate decreased fall risk.  (03/20/2017)   Baseline 16/30 on 01/17/17   Time 8   Period Weeks   Status Revised               Plan - 02/10/17 1219    Clinical Impression Statement Patient has difficulty increasing gait speed which impacts ability to cross a street, etc. Use of treadmill to increase pace, pt performed with increased deviations with minimal knee flexion for swing. Use of NuStep facilitated reciprocal knee flexion better.    Rehab Potential Good   Clinical Impairments Affecting Rehab Potential recent lumbar surgery-pain and mm tightness   PT Frequency 2x / week   PT Duration 8 weeks   PT Treatment/Interventions ADLs/Self Care Home Management;Cryotherapy;Electrical Stimulation;Moist Heat;Neuromuscular  re-education;Balance training;Therapeutic exercise;Therapeutic activities;Functional mobility training;Patient/family education;Gait training;DME Instruction;Stair training;Orthotic Fit/Training;Taping;Passive range of motion;Aquatic Therapy;Canalith Repostioning;Vestibular  PT Next Visit Plan *CHECK BP PRIOR TO EX* manually with XL cuff(goal BP 130/90);  Primary PT, Edman Circle, included dry needling in treatment plan which is to be started next session.  review LE stretches, dry needling?, gaze adaptation (dizziness?), trampoline jumps: jump squats, split jumps, jacks, marching; mountain climbers UE on mat, treadmill forwards and backwards- faster speeds and inclines, less UE support, stairs without UE support, ladder, lifting, Aquatic therapy?   Consulted and Agree with Plan of Care Patient      Patient will benefit from skilled therapeutic intervention in order to improve the following deficits and impairments:  Abnormal gait, Decreased balance, Decreased mobility, Decreased strength, Pain, Difficulty walking, Improper body mechanics, Decreased range of motion, Increased muscle spasms, Impaired flexibility, Decreased coordination, Decreased endurance, Dizziness, Impaired tone  Visit Diagnosis: Hemiplegia and hemiparesis following cerebral infarction affecting right non-dominant side (HCC)  Muscle weakness (generalized)  Other abnormalities of gait and mobility  Unsteadiness on feet  Acute midline low back pain without sciatica     Problem List Patient Active Problem List   Diagnosis Date Noted  . Abnormality of gait as late effect of stroke 08/30/2016  . Hemiparesis affecting dominant side as late effect of cerebrovascular accident (CVA) (HCC) 08/02/2016  . Disorder of lumbosacral plexus 08/02/2016  . Small vessel disease, cerebrovascular 07/05/2016  . Right sided weakness 07/03/2016  . Facial droop 07/03/2016  . Stroke (HCC) 07/03/2016  . Anemia   . Tachycardia   . Back pain  06/27/2016    Izaia Say PT, DPT 02/10/2017, 12:26 PM  [facility] [address] [place], [place], [address] Phone: [phone]   Fax:  [phone]  Name: Darrell Watson MRN: [id number] Date of Birth: [birth year]

## 2017-02-11 ENCOUNTER — Encounter: Payer: Self-pay | Admitting: Physical Therapy

## 2017-02-11 ENCOUNTER — Ambulatory Visit: Payer: 59 | Admitting: Physical Therapy

## 2017-02-11 DIAGNOSIS — R2689 Other abnormalities of gait and mobility: Secondary | ICD-10-CM

## 2017-02-11 DIAGNOSIS — R2681 Unsteadiness on feet: Secondary | ICD-10-CM

## 2017-02-11 DIAGNOSIS — I69353 Hemiplegia and hemiparesis following cerebral infarction affecting right non-dominant side: Secondary | ICD-10-CM | POA: Diagnosis not present

## 2017-02-11 DIAGNOSIS — M6281 Muscle weakness (generalized): Secondary | ICD-10-CM

## 2017-02-11 DIAGNOSIS — M545 Low back pain, unspecified: Secondary | ICD-10-CM

## 2017-02-11 NOTE — Therapy (Signed)
[facility] [address] [place], [place], [address] Phone: [phone]   Fax:  [phone]  Physical Therapy Treatment  Patient Details  Name: Darrell Watson MRN: [id number] Date of Birth: 1960/09/25 Referring Provider: Erick Colace, MD  Encounter Date: 02/11/2017      PT End of Session - 02/11/17 0835    Visit Number 31   Number of Visits 39   Date for PT Re-Evaluation 03/20/17   Authorization Type [id number] visit limit combined   PT Start Time 0840   PT Stop Time 0940   PT Time Calculation (min) 60 min   Activity Tolerance Patient tolerated treatment well   Behavior During Therapy [facility] for tasks assessed/performed      Past Medical History:  Diagnosis Date  . Anemia   . Arthritis   . Stroke (HCC)   . Tachycardia     Past Surgical History:  Procedure Laterality Date  . ABDOMINAL EXPOSURE N/A 06/27/2016   Procedure: ABDOMINAL EXPOSURE;  Surgeon: Chuck Hint, MD;  Location: [facility] OR;  Service: Vascular;  Laterality: N/A;  . ANTERIOR LUMBAR FUSION N/A 06/27/2016   Procedure: ANTERIOR LUMBAR FUSION L5-S1;  Surgeon: Venita Lick, MD;  Location: [facility] OR;  Service: Orthopedics;  Laterality: N/A;  Requests 3 hours  . FINGER AMPUTATION Left   . SHOULDER SURGERY      There were no vitals filed for this visit.      Subjective Assessment - 02/11/17 0839    Subjective I went for back surgery and I had a CVA 7 days after back surgery March 2018. I am doing better.   Pertinent History lumbar fusion 06/27/16, L CVA with R hemiparesis   Limitations Standing;Walking   Patient Stated Goals improve RLE strength; reports RUE strength has improved from working out at gym; wants to be able to get back to work   Currently in Pain? Yes   Pain Score 2    Pain Location Back   Pain Orientation Lower   Pain Descriptors / Indicators Aching;Sore   Pain Type Chronic pain   Pain Onset More than a month ago   Pain Frequency  Intermittent   Aggravating Factors  I feel it when I sit too long and transition from standing.            [facility] PT Assessment - 02/11/17 0853      Palpation   Palpation comment tight bil quadratus lumborum with left QL elevated > right. but bil tight.  left  tight hamstrings and hip flexor. left tightness > right                      [facility] Adult PT Treatment/Exercise - 02/11/17 0844      Self-Care   Self-Care Other Self-Care Comments   Other Self-Care Comments  education on aftercare and precautians with TDN .  Pt to take soaking epsom salt bath today after needling at home and to use heating pad for comfort for next 48 hours also tennis ball to hip flexor self myofascial release in prone with ham curls     Lumbar Exercises: Stretches   Active Hamstring Stretch 3 reps;30 seconds;Limitations   Single Knee to Chest Stretch 5 reps;10 seconds   Single Knee to Chest Stretch Limitations bil   Prone Mid Back Stretch 2 reps   Prone Mid Back Stretch Limitations forward child's pose for 30 seconds for stretch post needling     Modalities  Modalities Moist Heat     Moist Heat Therapy   Number Minutes Moist Heat 15 Minutes   Moist Heat Location Lumbar Spine;Hip     Manual Therapy   Manual Therapy Soft tissue mobilization;Myofascial release   Soft tissue mobilization bil lumbar paraspinals, Quadratus lumborum, left hamstrings and left hip  use of IASTYM tool for all muscles involved   Myofascial Release bil lumbar paraspinals, quadratus lumborum, left hamstrings and left hip flexors          Trigger Point Dry Needling - 02/11/17 0856    Consent Given? Yes   Education Handout Provided Yes   Muscles Treated Upper Body Quadratus Lumborum;Iliopsoas;Hamstring;Longissimus  Bil QL and paraspinals . left hamstring and flexor   Muscles Treated Lower Body Hamstring  left only med/lat mx belly twitch   Longissimus Response --  erector spinae L1 to L-5 bil    Hamstring  Response Twitch response elicited;Palpable increased muscle length              PT Education - 02/11/17 0850    Education provided Yes   Education Details Education on precautians and aftercare of TDN followed by stretching of muscles needled   Person(s) Educated Patient   Methods Explanation;Demonstration   Comprehension Verbalized understanding;Returned demonstration          PT Short Term Goals - 01/23/17 1206      PT SHORT TERM GOAL #1   Title Pt will report 0/10 dizziness when descending stairs/head turns during gait and will demonstrate negative positional testing    Baseline see new vestibular goal below   Time 4   Period Weeks   Status Achieved     PT SHORT TERM GOAL #2   Title Pt will improve safety with gait as indicated by increase in FGA score to > or = 19/30   Baseline 16/30   Time 4   Period Weeks   Status New   Target Date 02/18/17     PT SHORT TERM GOAL #3   Title Pt will decrease falls risk in community as indicated by increase in gait velocity to > or = to 2.5 ft/sec   Baseline decreased to 2.46 ft/sec due to dizziness   Time 4   Period Weeks   Status Revised   Target Date 02/18/17     PT SHORT TERM GOAL #4   Title Pt will demonstrate independence with gaze adaptation HEP and improved use of VOR during mobility as indicated by DVA 2 line difference   Baseline 3 line difference on 01/23/17   Status New   Target Date 02/18/17           PT Long Term Goals - 01/17/17 1544      PT LONG TERM GOAL #1   Title Pt will be independent with HEP and will demonstrate safe technique for performing quad, glute, hamstring, gastroc/DF strengthening at gym and safe use of treadmill (use of machines)   Time 8   Period Weeks   Status Revised   Target Date 03/20/17     PT LONG TERM GOAL #3   Title improve gait velocity to >/= 2.62 ft/sec for improved community access    Baseline improved 2.46 but not to target goal-limited by dizziness today   Time 8    Period Weeks   Status Revised   Target Date 03/20/17     PT LONG TERM GOAL #4   Title amb > 1,000 feet on uneven paved, curb and grassy outdoor  surfaces at MOD I for improved mobility to enter/exit work   Baseline amb >1,000 feet on uneven paved outdoor surfaces at Supervision on 8/20   Time 8   Period Weeks   Status Revised   Target Date 03/20/17     PT LONG TERM GOAL #5   Title Pt will demonstrate safe ascending/descending A-frame ladder while carrying object up to 15lb in one hand, MOD I   Time 8   Period Weeks   Status Revised   Target Date 03/20/17     PT LONG TERM GOAL #6   Title Pt will demonstrate safe lifting techniques (up to 25 lb) from various height surfaces and will carry objects x 150' over level surfaces with improved body mechanics. (03/20/2017)   Time 8   Period Weeks   Status Revised     PT LONG TERM GOAL #7   Title Will improve FOTO Neuro QOL LE by 20 points (03/20/2017)   Baseline 40   Time 8   Period Weeks   Status On-going     PT LONG TERM GOAL #8   Title Pt will improve FGA to 20/23 in order to indicate decreased fall risk.  (03/20/2017)   Baseline 16/30 on 01/17/17   Time 8   Period Weeks   Status Revised               Plan - 02/11/17 0900    Clinical Impression Statement Pt presents with tight bil Quadratus Lumborum and bil paraspinals as well as left hip flexor and hamstrings.  Pt consented to trigger point dry needling and was monitored throughout session.  Pt presented with palpable lengthening of bil paraspinals post needling and lateral left hamstring.  Medical hamstring with improved lengthening but tightness remaining and will benefit from continuing treatment.  Pt was educated on self myofascial release of left hip flexor using a tennis ball.  Pt tolerated session well    Rehab Potential Good   Clinical Impairments Affecting Rehab Potential recent lumbar surgery-pain and mm tightness   PT Frequency 2x / week   PT Duration 8 weeks    PT Treatment/Interventions ADLs/Self Care Home Management;Cryotherapy;Electrical Stimulation;Moist Heat;Neuromuscular re-education;Balance training;Therapeutic exercise;Therapeutic activities;Functional mobility training;Patient/family education;Gait training;DME Instruction;Stair training;Orthotic Fit/Training;Taping;Passive range of motion;Aquatic Therapy;Canalith Repostioning;Vestibular   PT Next Visit Plan *CHECK BP PRIOR TO EX* manually with XL cuff(goal BP 130/90);  Primary PT, Edman Circle, . Garen Lah PT Assess benefit of TDN.  review LE stretches, gaze adaptation (dizziness?), trampoline jumps: jump squats, split jumps, jacks, marching; mountain climbers UE on mat, treadmill forwards and backwards- faster speeds and inclines, less UE support, stairs without UE support, ladder, lifting, Aquatic therapy?   PT Home Exercise Plan Provided with yoga sequence   Consulted and Agree with Plan of Care Patient      Patient will benefit from skilled therapeutic intervention in order to improve the following deficits and impairments:  Abnormal gait, Decreased balance, Decreased mobility, Decreased strength, Pain, Difficulty walking, Improper body mechanics, Decreased range of motion, Increased muscle spasms, Impaired flexibility, Decreased coordination, Decreased endurance, Dizziness, Impaired tone  Visit Diagnosis: Hemiplegia and hemiparesis following cerebral infarction affecting right non-dominant side (HCC)  Muscle weakness (generalized)  Other abnormalities of gait and mobility  Unsteadiness on feet  Acute midline low back pain without sciatica     Problem List Patient Active Problem List   Diagnosis Date Noted  . Abnormality of gait as late effect of stroke 08/30/2016  . Hemiparesis affecting dominant side as  late effect of cerebrovascular accident (CVA) (HCC) 08/02/2016  . Disorder of lumbosacral plexus 08/02/2016  . Small vessel disease, cerebrovascular 07/05/2016  . Right  sided weakness 07/03/2016  . Facial droop 07/03/2016  . Stroke (HCC) 07/03/2016  . Anemia   . Tachycardia   . Back pain 06/27/2016    Garen Lah, PT Certified Exercise Expert for the Aging Adult  02/11/17 12:03 PM Phone: [phone] Fax: [phone]  [facility] [address] [place], [place], [address] Phone: [phone]   Fax:  [phone]  Name: MACOY RODWELL MRN: [id number] Date of Birth: 1960/11/13

## 2017-02-11 NOTE — Patient Instructions (Addendum)
Trigger Point Dry Needling  . What is Trigger Point Dry Needling (DN)? o DN is a physical therapy technique used to treat muscle pain and dysfunction. Specifically, DN helps deactivate muscle trigger points (muscle knots).  o A thin filiform needle is used to penetrate the skin and stimulate the underlying trigger point. The goal is for a local twitch response (LTR) to occur and for the trigger point to relax. No medication of any kind is injected during the procedure.   . What Does Trigger Point Dry Needling Feel Like?  o The procedure feels different for each individual patient. Some patients report that they do not actually feel the needle enter the skin and overall the process is not painful. Very mild bleeding may occur. However, many patients feel a deep cramping in the muscle in which the needle was inserted. This is the local twitch response.   Marland Kitchen How Will I feel after the treatment? o Soreness is normal, and the onset of soreness may not occur for a few hours. Typically this soreness does not last longer than two days.  o Bruising is uncommon, however; ice can be used to decrease any possible bruising.  o In rare cases feeling tired or nauseous after the treatment is normal. In addition, your symptoms may get worse before they get better, this period will typically not last longer than 24 hours.   . What Can I do After My Treatment? o Increase your hydration by drinking more water for the next 24 hours. o You may place ice or heat on the areas treated that have become sore, however, do not use heat on inflamed or bruised areas. Heat often brings more relief post needling. o You can continue your regular activities, but vigorous activity is not recommended initially after the treatment for 24 hours. o DN is best combined with other physical therapy such as strengthening, stretching, and other therapies.    Today  Soak in a 20 minutes Epsom salt bath and then use heating pads for next 24  hours off and on for comfort.     Garen Lah, PT Certified Exercise Expert for the Aging Adult  02/11/17 8:34 AM Phone: [phone] Fax: [phone]

## 2017-02-12 ENCOUNTER — Ambulatory Visit: Payer: Self-pay | Admitting: Physical Therapy

## 2017-02-17 ENCOUNTER — Encounter: Payer: Self-pay | Admitting: Physical Medicine & Rehabilitation

## 2017-02-17 ENCOUNTER — Ambulatory Visit: Payer: 59 | Admitting: Physical Therapy

## 2017-02-17 ENCOUNTER — Ambulatory Visit (HOSPITAL_BASED_OUTPATIENT_CLINIC_OR_DEPARTMENT_OTHER): Payer: 59 | Admitting: Physical Medicine & Rehabilitation

## 2017-02-17 ENCOUNTER — Encounter: Payer: Self-pay | Admitting: Physical Therapy

## 2017-02-17 ENCOUNTER — Encounter: Payer: 59 | Attending: Physical Medicine & Rehabilitation

## 2017-02-17 VITALS — BP 144/94 | HR 99 | Resp 14

## 2017-02-17 VITALS — BP 150/100

## 2017-02-17 DIAGNOSIS — M545 Low back pain, unspecified: Secondary | ICD-10-CM

## 2017-02-17 DIAGNOSIS — I69398 Other sequelae of cerebral infarction: Secondary | ICD-10-CM

## 2017-02-17 DIAGNOSIS — M6281 Muscle weakness (generalized): Secondary | ICD-10-CM

## 2017-02-17 DIAGNOSIS — G541 Lumbosacral plexus disorders: Secondary | ICD-10-CM | POA: Diagnosis not present

## 2017-02-17 DIAGNOSIS — I69359 Hemiplegia and hemiparesis following cerebral infarction affecting unspecified side: Secondary | ICD-10-CM

## 2017-02-17 DIAGNOSIS — Z981 Arthrodesis status: Secondary | ICD-10-CM | POA: Insufficient documentation

## 2017-02-17 DIAGNOSIS — R2681 Unsteadiness on feet: Secondary | ICD-10-CM

## 2017-02-17 DIAGNOSIS — I69353 Hemiplegia and hemiparesis following cerebral infarction affecting right non-dominant side: Secondary | ICD-10-CM | POA: Diagnosis not present

## 2017-02-17 DIAGNOSIS — R269 Unspecified abnormalities of gait and mobility: Secondary | ICD-10-CM

## 2017-02-17 DIAGNOSIS — I69351 Hemiplegia and hemiparesis following cerebral infarction affecting right dominant side: Secondary | ICD-10-CM | POA: Insufficient documentation

## 2017-02-17 DIAGNOSIS — R42 Dizziness and giddiness: Secondary | ICD-10-CM

## 2017-02-17 DIAGNOSIS — R2689 Other abnormalities of gait and mobility: Secondary | ICD-10-CM

## 2017-02-17 NOTE — Progress Notes (Signed)
Subjective:    Patient ID: Darrell Watson, male    DOB: [birth year], 56 y.o.   MRN: [id number] 56 year old male with subcortical CVA causing right hemiparesis. He also had recent bar fusion with postop hematoma, left iliopsoas with lumbar plexopathy. HPI Still cutting grass with push mower Left lower extremity strength is improving. Right lower extremity strength is still his major issue Walking speed still on low side ~2.3 Fell at home while stomping on a spider Walks around block, ~4630minLoses his balance if he carries heavier objects approximately 40 pounds  Pain Inventory Average Pain 0 Pain Right Now 0 My pain is no pain  In the last 24 hours, has pain interfered with the following? General activity 2 Relation with others 2 Enjoyment of life 2 What TIME of day is your pain at its worst? daytime Sleep (in general) Fair  Pain is worse with: no pain Pain improves with: no pain Relief from Meds: no pain  Mobility walk without assistance ability to climb steps?  yes do you drive?  yes  Function employed # of hrs/week .  Neuro/Psych No problems in this area  Prior Studies Any changes since last visit?  no  Physicians involved in your care Any changes since last visit?  no   History reviewed. No pertinent family history. Social History   Social History  . Marital status: Married    Spouse name: N/A  . Number of children: N/A  . Years of education: N/A   Social History Main Topics  . Smoking status: Never Smoker  . Smokeless tobacco: Never Used  . Alcohol use Yes     Comment: BEER  OCC  . Drug use: No  . Sexual activity: Not Asked   Other Topics Concern  . None   Social History Narrative  . None   Past Surgical History:  Procedure Laterality Date  . ABDOMINAL EXPOSURE N/A 06/27/2016   Procedure: ABDOMINAL EXPOSURE;  Surgeon: Chuck Hinthristopher S Dickson, MD;  [facility];  Service: Vascular;  Laterality: N/A;  . ANTERIOR LUMBAR FUSION N/A 06/27/2016   Procedure: ANTERIOR LUMBAR FUSION L5-S1;  Surgeon: Venita Lickahari Brooks, MD;  Location: [facility] OR;  Service: Orthopedics;  Laterality: N/A;  Requests 3 hours  . FINGER AMPUTATION Left   . SHOULDER SURGERY     Past Medical History:  Diagnosis Date  . Anemia   . Arthritis   . Stroke (HCC)   . Tachycardia    BP (!) 144/94 (BP Location: Right Arm, Patient Position: Sitting, Cuff Size: Large)   Pulse 99   Resp 14   SpO2 98%   Opioid Risk Score:   Fall Risk Score:  `1  Depression screen PHQ 2/9  Depression screen PHQ 2/9 08/02/2016  Decreased Interest 0  Down, Depressed, Hopeless 0  PHQ - 2 Score 0  Altered sleeping 0  Tired, decreased energy 0  Change in appetite 0  Feeling bad or failure about yourself  0  Trouble concentrating 0  Moving slowly or fidgety/restless 0  Suicidal thoughts 0  PHQ-9 Score 0  Difficult doing work/chores Not difficult at all    Review of Systems  Constitutional: Negative.   HENT: Negative.   Eyes: Negative.   Respiratory: Negative.   Cardiovascular: Negative.   Gastrointestinal: Negative.   Endocrine: Negative.   Genitourinary: Negative.   Musculoskeletal: Negative.   Skin: Negative.   Allergic/Immunologic: Negative.   Neurological: Negative.   Hematological: Negative.   Psychiatric/Behavioral: Negative.   All other systems reviewed  and are negative.      Objective:   Physical Exam  Constitutional: He is oriented to person, place, and time. He appears well-developed and well-nourished. No distress.  HENT:  Head: Normocephalic and atraumatic.  Eyes: Pupils are equal, round, and reactive to light. Conjunctivae and EOM are normal.  Neurological: He is alert and oriented to person, place, and time.  Motor strength is 5/5 bilateral deltoid, bicep, triceps, grip, 5/5 and left hip flexor, knee extensor, ankle dorsal flexor 4 5 in the right hip flexor, knee extensor and ankle dorsiflexor. He has increased tone extensor, right lower  extremity. Sensation intact to light touch  Gait is without evidence of toe drag or knee instability. He does have a stiff legged gait on the right side  Skin: He is not diaphoretic.  Psychiatric: He has a normal mood and affect. His behavior is normal. Judgment and thought content normal.  Nursing note and vitals reviewed.  tandem gait has some difficulty leans toward the right. Needs to touch the wall         Assessment & Plan:  1. Right hemiparesis, lower extremity greater than upper extremity secondary to left subcortical CVA. Overall has made excellent improvement is modified independent and driving. Walking tolerance up to 30 minutes, balance improving, but not back to normal. 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 I think he can return to work with the following restrictions, 20 pound lifting and carrying. No climbing ladders. Initially restricted to 5 hours per day for the first 2 weeks, then may go up to 8 hours a day if he is tolerating this well.

## 2017-02-18 NOTE — Therapy (Signed)
[facility] [address] [place], [place], [address] Phone: [phone]   Fax:  [phone]  Physical Therapy Treatment  Patient Details  Name: CACHE BILLS MRN: [id number] Date of Birth: [birth year] Referring Provider: Charlett Blake, MD  Encounter Date: 02/17/2017      PT End of Session - 02/18/17 0801    Visit Number 32   Number of Visits 39   Date for PT Re-Evaluation 03/20/17   Authorization Type UHC-60 visit limit combined   Authorization - Visit Number 54   Authorization - Number of Visits 60   PT Start Time 0800   PT Stop Time 0848   PT Time Calculation (min) 48 min   Activity Tolerance Patient tolerated treatment well   Behavior During Therapy [facility] for tasks assessed/performed      Past Medical History:  Diagnosis Date  . Anemia   . Arthritis   . Stroke ([place])   . Tachycardia     Past Surgical History:  Procedure Laterality Date  . ABDOMINAL EXPOSURE N/A 06/27/2016   Procedure: ABDOMINAL EXPOSURE;  Surgeon: Angelia Mould, MD;  Location: [place];  Service: Vascular;  Laterality: N/A;  . ANTERIOR LUMBAR FUSION N/A 06/27/2016   Procedure: ANTERIOR LUMBAR FUSION L5-S1;  Surgeon: Melina Schools, MD;  Location: [place];  Service: Orthopedics;  Laterality: N/A;  Requests 3 hours  . FINGER AMPUTATION Left   . SHOULDER SURGERY      Vitals:   02/17/17 0813  BP: (!) 150/100        Subjective Assessment - 02/17/17 0808    Subjective Pt returns after DN; pt reports having significant pain during DN but reported improved ROM and gait fluidity immediately after DN but after 3 hours he reports his muscles tightened back up and rigidity during gait returned.  No pain today.  Did report a little dizzines when first standing up from chair in waiting area but subsides quickly.  Had one fall, when standing on one foot to stomp a spider-fell backwards and sat down on his butt.  No pain afterwards.   Pertinent  History lumbar fusion 06/27/16, L CVA with R hemiparesis   Limitations Standing;Walking   How long can you stand comfortably? 2-3 hours doing yard work-but LE fatigues quickly   How long can you walk comfortably? at least 10 min   Patient Stated Goals improve RLE strength; reports RUE strength has improved from working out at gym; wants to be able to get back to work   Currently in Pain? No/denies            [facility] PT Assessment - 02/17/17 0814      Flexibility   Hamstrings LLE: improved to 65 deg SLR; RLE: improved to 60 deg SLR     Palpation   Palpation comment Decreased tightness and improved symmetry in quadratus lumborum     Thomas Test    Findings Positive   Side Right;Left   Comments LLE: improved: now lacking 10 deg to neutral extension; RLE: same-lacking 10 deg to neutral extension     Standardized Balance Assessment   10 Meter Walk 12.85 or 2.55 ft/sec     Functional Gait  Assessment   Gait assessed  Yes   Gait Level Surface Walks 20 ft in less than 7 sec but greater than 5.5 sec, uses assistive device, slower speed, mild gait deviations, or deviates 6-10 in outside of the 12 in walkway width.   Change in Gait  Speed Able to change speed, demonstrates mild gait deviations, deviates 6-10 in outside of the 12 in walkway width, or no gait deviations, unable to achieve a major change in velocity, or uses a change in velocity, or uses an assistive device.   Gait with Horizontal Head Turns Performs head turns with moderate changes in gait velocity, slows down, deviates 10-15 in outside 12 in walkway width but recovers, can continue to walk.   Gait with Vertical Head Turns Performs task with moderate change in gait velocity, slows down, deviates 10-15 in outside 12 in walkway width but recovers, can continue to walk.   Gait and Pivot Turn Pivot turns safely within 3 sec and stops quickly with no loss of balance.   Step Over Obstacle Is able to step over 2 stacked shoe boxes taped  together (9 in total height) without changing gait speed. No evidence of imbalance.   Gait with Narrow Base of Support Is able to ambulate for 10 steps heel to toe with no staggering.   Gait with Eyes Closed Cannot walk 20 ft without assistance, severe gait deviations or imbalance, deviates greater than 15 in outside 12 in walkway width or will not attempt task.   Ambulating Backwards Walks 20 ft, uses assistive device, slower speed, mild gait deviations, deviates 6-10 in outside 12 in walkway width.   Steps Alternating feet, must use rail.   Total Score 19   FGA comment: 19/30                             PT Education - 02/18/17 0800    Education provided Yes   Education Details progress and ROM improvements after DN, STG achievement and areas to continue to focus on   Person(s) Educated Patient   Methods Explanation   Comprehension Verbalized understanding          PT Short Term Goals - 02/18/17 0805      PT SHORT TERM GOAL #1   Title Pt will report 0/10 dizziness when descending stairs/head turns during gait and will demonstrate negative positional testing    Baseline see new vestibular goal below   Time 4   Period Weeks   Status Achieved     PT SHORT TERM GOAL #2   Title Pt will improve safety with gait as indicated by increase in FGA score to > or = 19/30   Baseline 16/30; 19/30 on 10/22   Time 4   Period Weeks   Status Achieved     PT SHORT TERM GOAL #3   Title Pt will decrease falls risk in community as indicated by increase in gait velocity to > or = to 2.5 ft/sec   Baseline decreased to 2.46 ft/sec due to dizziness; 2.55 ft/sec on 10/22   Time 4   Period Weeks   Status Achieved     PT SHORT TERM GOAL #4   Title Pt will demonstrate independence with gaze adaptation HEP and improved use of VOR during mobility as indicated by DVA 2 line difference   Baseline 3 line difference on 01/23/17   Status On-going           PT Long Term Goals -  02/18/17 0813      PT LONG TERM GOAL #1   Title Pt will be independent with HEP and will demonstrate safe technique for performing quad, glute, hamstring, gastroc/DF strengthening at gym and safe use of treadmill (use of machines)  Time 8   Period Weeks   Status Revised   Target Date 03/20/17     PT LONG TERM GOAL #3   Title improve gait velocity to >/= 2.62 ft/sec for improved community access    Baseline 2.55 ft/sec on 10/22   Time 8   Period Weeks   Status Revised   Target Date 03/20/17     PT LONG TERM GOAL #4   Title amb > 1,000 feet on uneven paved, curb and grassy outdoor surfaces at MOD I for improved mobility to enter/exit work   Baseline amb >1,000 feet on uneven paved outdoor surfaces at Supervision on 8/20   Time 8   Period Weeks   Status Revised   Target Date 03/20/17     PT LONG TERM GOAL #5   Title Pt will demonstrate safe ascending/descending A-frame ladder while carrying object up to 15lb in one hand, MOD I   Time 8   Period Weeks   Status Revised   Target Date 03/20/17     PT LONG TERM GOAL #6   Title Pt will demonstrate safe lifting techniques (up to 25 lb) from various height surfaces and will carry objects x 150' over level surfaces with improved body mechanics. (03/20/2017)   Time 8   Period Weeks   Status Revised     PT LONG TERM GOAL #7   Title Will improve FOTO Neuro QOL LE by 20 points (03/20/2017)   Baseline 40   Time 8   Period Weeks   Status On-going     PT LONG TERM GOAL #8   Title Pt will improve FGA to > or = 22/30 in order to indicate decreased fall risk.  (03/20/2017)   Baseline 19/30 on 10/22   Time 8   Period Weeks   Status Revised               Plan - 02/18/17 0806    Clinical Impression Statement Treatment session today focused on assessment of progress towards STG and response to dry needling treatment last week.  Pt has met 3/4 STG and demonstrates significant improvement in L hamstring and hip flexor lengthening  and improved length in paraspinal muscles, pt also demonstrates improvements in gait velocity and balance during dynamic gait tasks as indicated by FGA. Pt will benefit from ongoing dry needling and NMR to maintain ROM achievements and to retrain maladaptive/protective movement patterns.  Will continue to address.   Rehab Potential Good   Clinical Impairments Affecting Rehab Potential recent lumbar surgery-pain and mm tightness   PT Frequency 2x / week   PT Duration 8 weeks   PT Treatment/Interventions ADLs/Self Care Home Management;Cryotherapy;Electrical Stimulation;Moist Heat;Neuromuscular re-education;Balance training;Therapeutic exercise;Therapeutic activities;Functional mobility training;Patient/family education;Gait training;DME Instruction;Stair training;Orthotic Fit/Training;Taping;Passive range of motion;Aquatic Therapy;Canalith Repostioning;Vestibular   PT Next Visit Plan *CHECK BP PRIOR TO EX* manually with XL cuff(goal BP 130/90);  Primary PT, Edman Circle, . Garen Lah PT; Continue TDN and NMR for improved movement patterns. review LE stretches, gaze adaptation (dizziness?), trampoline jumps: jump squats, split jumps, jacks, marching; mountain climbers UE on mat, treadmill forwards and backwards- faster speeds and inclines, less UE support, stairs without UE support, ladder, lifting technique.   PT Home Exercise Plan Provided with yoga sequence   Consulted and Agree with Plan of Care Patient      Patient will benefit from skilled therapeutic intervention in order to improve the following deficits and impairments:  Abnormal gait, Decreased balance, Decreased mobility, Decreased strength, Pain, Difficulty  walking, Improper body mechanics, Decreased range of motion, Increased muscle spasms, Impaired flexibility, Decreased coordination, Decreased endurance, Dizziness, Impaired tone  Visit Diagnosis: Hemiplegia and hemiparesis following cerebral infarction affecting right non-dominant side  (HCC)  Muscle weakness (generalized)  Other abnormalities of gait and mobility  Unsteadiness on feet  Acute midline low back pain without sciatica  Dizziness and giddiness     Problem List Patient Active Problem List   Diagnosis Date Noted  . Abnormality of gait as late effect of stroke 08/30/2016  . Hemiparesis affecting dominant side as late effect of cerebrovascular accident (CVA) ([place]) 08/02/2016  . Disorder of lumbosacral plexus 08/02/2016  . Small vessel disease, cerebrovascular 07/05/2016  . Right sided weakness 07/03/2016  . Facial droop 07/03/2016  . Stroke ([place]) 07/03/2016  . Anemia   . Tachycardia   . Back pain 06/27/2016    Rico Junker, PT, DPT 02/18/17    8:15 AM    [facility] [address] [place], [place], [address] Phone: [phone]   Fax:  [phone]  Name: BRIGHAM COBBINS MRN: [id number] Date of Birth: 1961-04-24

## 2017-02-20 ENCOUNTER — Telehealth: Payer: Self-pay | Admitting: *Deleted

## 2017-02-20 ENCOUNTER — Ambulatory Visit: Payer: 59 | Admitting: Physical Therapy

## 2017-02-20 ENCOUNTER — Ambulatory Visit: Payer: Self-pay | Admitting: Physical Therapy

## 2017-02-20 ENCOUNTER — Encounter: Payer: Self-pay | Admitting: Physical Therapy

## 2017-02-20 DIAGNOSIS — M545 Low back pain, unspecified: Secondary | ICD-10-CM

## 2017-02-20 DIAGNOSIS — I69353 Hemiplegia and hemiparesis following cerebral infarction affecting right non-dominant side: Secondary | ICD-10-CM | POA: Diagnosis not present

## 2017-02-20 DIAGNOSIS — R2689 Other abnormalities of gait and mobility: Secondary | ICD-10-CM

## 2017-02-20 DIAGNOSIS — M6281 Muscle weakness (generalized): Secondary | ICD-10-CM

## 2017-02-20 NOTE — Therapy (Signed)
[facility] [address] [place], [place], [address] Phone: [phone]   Fax:  [phone]  Physical Therapy Treatment  Patient Details  Name: Darrell Watson MRN: [id number] Date of Birth: 03/16/1961 Referring Provider: Erick Colace, MD  Encounter Date: 02/20/2017      PT End of Session - 02/20/17 1033    Visit Number 33   Number of Visits 39   Date for PT Re-Evaluation 03/20/17   Authorization Type UHC-60 visit limit combined   PT Start Time 0932   PT Stop Time 1034   PT Time Calculation (min) 62 min   Activity Tolerance Patient tolerated treatment well   Behavior During Therapy [facility] for tasks assessed/performed      Past Medical History:  Diagnosis Date  . Anemia   . Arthritis   . Stroke (HCC)   . Tachycardia     Past Surgical History:  Procedure Laterality Date  . ABDOMINAL EXPOSURE N/A 06/27/2016   Procedure: ABDOMINAL EXPOSURE;  Surgeon: Chuck Hint, MD;  Location: [facility];  Service: Vascular;  Laterality: N/A;  . ANTERIOR LUMBAR FUSION N/A 06/27/2016   Procedure: ANTERIOR LUMBAR FUSION L5-S1;  Surgeon: Venita Lick, MD;  Location[facility];  Service: Orthopedics;  Laterality: N/A;  Requests 3 hours  . FINGER AMPUTATION Left   . SHOULDER SURGERY      There were no vitals filed for this visit.      Subjective Assessment - 02/20/17 0936    Subjective Pt said he had improved AROM after visit but then it tightened up.  No falls since last reported in which pt was trying to stomp on a spider   Pertinent History lumbar fusion 06/27/16, L CVA with R hemiparesis   Limitations Standing;Walking   Patient Stated Goals improve RLE strength; reports RUE strength has improved from working out at gym; wants to be able to get back to work   Currently in Pain? No/denies            [facility] PT Assessment - 02/20/17 1033      Flexibility   Hamstrings L LE to 67 degrees post TPDN RX to day                      [facility] Adult PT Treatment/Exercise - 02/20/17 1024      Knee/Hip Exercises: Stretches   Passive Hamstring Stretch 3 reps   Passive Hamstring Stretch Limitations use of stretch out strap in clinic and education on use     Moist Heat Therapy   Number Minutes Moist Heat 15 Minutes   Moist Heat Location Lumbar Spine     Manual Therapy   Manual Therapy Soft tissue mobilization;Myofascial release   Manual therapy comments use of hamstring stretch and gastroc stretch in standing gastroc stretch position use of foam roller in stance position with PT administering.   Soft tissue mobilization left lumbar paraspinals, Quadratus lumborum, left hamstrings and left hip  use of IASTYM tool for all muscles involved   Myofascial Release bil lumbar paraspinals, quadratus lumborum, left hamstrings and left hip flexors          Trigger Point Dry Needling - 02/20/17 6962    Consent Given? Yes   Education Handout Provided --  verbally reinforced   Muscles Treated Upper Body Quadratus Lumborum;Iliopsoas;Hamstring;Longissimus  left QL and, paraspinals, Left Hamstring/ iliopsoas   Muscles Treated Lower Body Hamstring  left only medial > lateral   Longissimus Response Twitch  response elicited;Palpable increased muscle length  erector spinae L-1 to L 5 left only   Hamstring Response Twitch response elicited;Palpable increased muscle length              PT Education - 02/20/17 1026    Education provided Yes   Education Details Pt tolerates TDN but used Ice pack for distraction.  Pt with palpable lengthening after TDN, on hamstring stretch and reinforcement of post TDN stretch and care   Person(s) Educated Patient   Methods Explanation;Demonstration   Comprehension Verbalized understanding;Returned demonstration          PT Short Term Goals - 02/18/17 0805      PT SHORT TERM GOAL #1   Title Pt will report 0/10 dizziness when descending stairs/head turns  during gait and will demonstrate negative positional testing    Baseline see new vestibular goal below   Time 4   Period Weeks   Status Achieved     PT SHORT TERM GOAL #2   Title Pt will improve safety with gait as indicated by increase in FGA score to > or = 19/30   Baseline 16/30; 19/30 on 10/22   Time 4   Period Weeks   Status Achieved     PT SHORT TERM GOAL #3   Title Pt will decrease falls risk in community as indicated by increase in gait velocity to > or = to 2.5 ft/sec   Baseline decreased to 2.46 ft/sec due to dizziness; 2.55 ft/sec on 10/22   Time 4   Period Weeks   Status Achieved     PT SHORT TERM GOAL #4   Title Pt will demonstrate independence with gaze adaptation HEP and improved use of VOR during mobility as indicated by DVA 2 line difference   Baseline 3 line difference on 01/23/17   Status On-going           PT Long Term Goals - 02/18/17 0813      PT LONG TERM GOAL #1   Title Pt will be independent with HEP and will demonstrate safe technique for performing quad, glute, hamstring, gastroc/DF strengthening at gym and safe use of treadmill (use of machines)   Time 8   Period Weeks   Status Revised   Target Date 03/20/17     PT LONG TERM GOAL #3   Title improve gait velocity to >/= 2.62 ft/sec for improved community access    Baseline 2.55 ft/sec on 10/22   Time 8   Period Weeks   Status Revised   Target Date 03/20/17     PT LONG TERM GOAL #4   Title amb > 1,000 feet on uneven paved, curb and grassy outdoor surfaces at MOD I for improved mobility to enter/exit work   Baseline amb >1,000 feet on uneven paved outdoor surfaces at Supervision on 8/20   Time 8   Period Weeks   Status Revised   Target Date 03/20/17     PT LONG TERM GOAL #5   Title Pt will demonstrate safe ascending/descending A-frame ladder while carrying object up to 15lb in one hand, MOD I   Time 8   Period Weeks   Status Revised   Target Date 03/20/17     PT LONG TERM GOAL #6    Title Pt will demonstrate safe lifting techniques (up to 25 lb) from various height surfaces and will carry objects x 150' over level surfaces with improved body mechanics. (03/20/2017)   Time 8   Period Weeks  Status Revised     PT LONG TERM GOAL #7   Title Will improve FOTO Neuro QOL LE by 20 points (03/20/2017)   Baseline 40   Time 8   Period Weeks   Status On-going     PT LONG TERM GOAL #8   Title Pt will improve FGA to > or = 22/30 in order to indicate decreased fall risk.  (03/20/2017)   Baseline 19/30 on 10/22   Time 8   Period Weeks   Status Revised               Plan - 02/20/17 1027    Clinical Impression Statement Pt enters clinic apprehensive about TPDN but consents to use for left hamstring , lumbar and Quadratus on left. Pt closely monitored  entire session.  Pt with medial hamstring with greater tightness than lateral.but palpable lengthening noted after session. left hamstring 67 degrees post RX.  Pt has one more appt for TPDN to maximize lengthening.  Pt notes good lengthening post session but states he returns to stiffness after a few days.  Will monitore for continued benefit   Clinical Impairments Affecting Rehab Potential recent lumbar surgery-pain and mm tightness   PT Frequency 2x / week   PT Duration 8 weeks   PT Treatment/Interventions ADLs/Self Care Home Management;Cryotherapy;Electrical Stimulation;Moist Heat;Neuromuscular re-education;Balance training;Therapeutic exercise;Therapeutic activities;Functional mobility training;Patient/family education;Gait training;DME Instruction;Stair training;Orthotic Fit/Training;Taping;Passive range of motion;Aquatic Therapy;Canalith Repostioning;Vestibular   PT Next Visit Plan *CHECK BP PRIOR TO EX* manually with XL cuff(goal BP 130/90);  Primary PT, Edman Circle, . Garen Lah PT; Continue TDN and NMR for improved movement patterns. review LE stretches, gaze adaptation (dizziness?), trampoline jumps: jump squats,  split jumps, jacks, marching; mountain climbers UE on mat, treadmill forwards and backwards- faster speeds and inclines, less UE support, stairs without UE support, ladder, lifting technique. one more TPDN on November 1st   PT Home Exercise Plan Pt used stretch out strap in clinic for hamstring to combine with sitting hamstring stretch   Consulted and Agree with Plan of Care Patient      Patient will benefit from skilled therapeutic intervention in order to improve the following deficits and impairments:  Abnormal gait, Decreased balance, Decreased mobility, Decreased strength, Pain, Difficulty walking, Improper body mechanics, Decreased range of motion, Increased muscle spasms, Impaired flexibility, Decreased coordination, Decreased endurance, Dizziness, Impaired tone  Visit Diagnosis: Hemiplegia and hemiparesis following cerebral infarction affecting right non-dominant side (HCC)  Muscle weakness (generalized)  Other abnormalities of gait and mobility  Acute midline low back pain without sciatica     Problem List Patient Active Problem List   Diagnosis Date Noted  . Abnormality of gait as late effect of stroke 08/30/2016  . Hemiparesis affecting dominant side as late effect of cerebrovascular accident (CVA) (HCC) 08/02/2016  . Disorder of lumbosacral plexus 08/02/2016  . Small vessel disease, cerebrovascular 07/05/2016  . Right sided weakness 07/03/2016  . Facial droop 07/03/2016  . Stroke (HCC) 07/03/2016  . Anemia   . Tachycardia   . Back pain 06/27/2016    Garen Lah, PT Certified Exercise Expert for the Aging Adult  02/20/17 10:37 AM Phone: [phone] Fax: [phone]  [facility] [address] [place], [place], [address] Phone: [phone]   Fax:  [phone]  Name: SHELLEY POOLEY MRN: [id number] Date of Birth: [birth year]

## 2017-02-20 NOTE — Telephone Encounter (Signed)
Maurine Ministerennis RN CM called to request clarification on restrictions placed on Mr Steele Bergenn at last appt.  Will they be permanent? Next appt is 03/17/17.  Please advise.

## 2017-02-21 ENCOUNTER — Telehealth: Payer: Self-pay | Admitting: Physical Medicine & Rehabilitation

## 2017-02-21 NOTE — Telephone Encounter (Signed)
Recd another voicemail from [place] [phone] wanting clarification and work status for [employer]is Raine her fax is [phone] - messages already in to [place] but he is out of office until [place] - will call

## 2017-02-24 ENCOUNTER — Telehealth: Payer: Self-pay | Admitting: Physical Medicine & Rehabilitation

## 2017-02-24 ENCOUNTER — Ambulatory Visit: Payer: 59 | Admitting: Physical Therapy

## 2017-02-24 ENCOUNTER — Encounter: Payer: Self-pay | Admitting: Physical Therapy

## 2017-02-24 VITALS — BP 144/88

## 2017-02-24 DIAGNOSIS — M6281 Muscle weakness (generalized): Secondary | ICD-10-CM

## 2017-02-24 DIAGNOSIS — I69353 Hemiplegia and hemiparesis following cerebral infarction affecting right non-dominant side: Secondary | ICD-10-CM

## 2017-02-24 DIAGNOSIS — M545 Low back pain, unspecified: Secondary | ICD-10-CM

## 2017-02-24 DIAGNOSIS — R278 Other lack of coordination: Secondary | ICD-10-CM

## 2017-02-24 DIAGNOSIS — R2689 Other abnormalities of gait and mobility: Secondary | ICD-10-CM

## 2017-02-24 DIAGNOSIS — R2681 Unsteadiness on feet: Secondary | ICD-10-CM

## 2017-02-24 NOTE — Therapy (Signed)
[facility] [address] [place], [place], [address] Phone: [phone]   Fax:  [phone]  Physical Therapy Treatment  Patient Details  Name: Darrell Watson MRN: [id number] Date of Birth: 19-Nov-1960 Referring Provider: Erick Colace, MD  Encounter Date: 02/24/2017      PT End of Session - 02/24/17 1211    Visit Number 34   Number of Visits 39   Date for PT Re-Evaluation 03/20/17   Authorization Type UHC-60 visit limit combined   PT Start Time 0845   PT Stop Time 0930   PT Time Calculation (min) 45 min   Activity Tolerance Patient tolerated treatment well   Behavior During Therapy [facility] for tasks assessed/performed      Past Medical History:  Diagnosis Date  . Anemia   . Arthritis   . Stroke (HCC)   . Tachycardia     Past Surgical History:  Procedure Laterality Date  . ABDOMINAL EXPOSURE N/A 06/27/2016   Procedure: ABDOMINAL EXPOSURE;  Surgeon: Chuck Hint, MD;  Location: [facility] OR;  Service: Vascular;  Laterality: N/A;  . ANTERIOR LUMBAR FUSION N/A 06/27/2016   Procedure: ANTERIOR LUMBAR FUSION L5-S1;  Surgeon: Venita Lick, MD;  Location: [facility] OR;  Service: Orthopedics;  Laterality: N/A;  Requests 3 hours  . FINGER AMPUTATION Left   . SHOULDER SURGERY      Vitals:   02/24/17 0851  BP: (!) 144/88        Subjective Assessment - 02/24/17 0849    Subjective He thinks he is done with dry needling. It hurts too much for the reward.    Pertinent History lumbar fusion 06/27/16, L CVA with R hemiparesis   Limitations Standing;Walking   Patient Stated Goals improve RLE strength; reports RUE strength has improved from working out at gym; wants to be able to get back to work   Currently in Pain? No/denies     Therapeutic Exercise: Elliptical with LEs only Level 1: forward 5 minutes with 30 sec comfortable pace & 30 sec fast pace; backwards 2 minutes comfortable Stairs with 2 rails light support: 4  steps increasing pace with fluency Weight shift changing directions to right & left with PT demo, verbal cues on pelvic motion/control. 7" & 10" hurdles: step over with step through pattern leading with RLE & LLE 8 reps each 7" & 8 reps eac 10" Side stepping over 7" hurdle: 8 reps to right & to left.  Squatting to pick up hurdles 8 reps Lunges with single UE support 3 reps with ea LE 5 times sit to/from stand without UEs: 12.54sec Braiding in parallel bars with BUE support: 10' 3 reps to right & to left. Standing hamstring stretches 20 sec hold 3 reps ea LE Standing hip adductor stretch 20 sec hold 2 reps ea LE                             PT Short Term Goals - 02/18/17 0805      PT SHORT TERM GOAL #1   Title Pt will report 0/10 dizziness when descending stairs/head turns during gait and will demonstrate negative positional testing    Baseline see new vestibular goal below   Time 4   Period Weeks   Status Achieved     PT SHORT TERM GOAL #2   Title Pt will improve safety with gait as indicated by increase in FGA score to > or = 19/30  Baseline 16/30; 19/30 on 10/22   Time 4   Period Weeks   Status Achieved     PT SHORT TERM GOAL #3   Title Pt will decrease falls risk in community as indicated by increase in gait velocity to > or = to 2.5 ft/sec   Baseline decreased to 2.46 ft/sec due to dizziness; 2.55 ft/sec on 10/22   Time 4   Period Weeks   Status Achieved     PT SHORT TERM GOAL #4   Title Pt will demonstrate independence with gaze adaptation HEP and improved use of VOR during mobility as indicated by DVA 2 line difference   Baseline 3 line difference on 01/23/17   Status On-going           PT Long Term Goals - 02/18/17 0813      PT LONG TERM GOAL #1   Title Pt will be independent with HEP and will demonstrate safe technique for performing quad, glute, hamstring, gastroc/DF strengthening at gym and safe use of treadmill (use of machines)    Time 8   Period Weeks   Status Revised   Target Date 03/20/17     PT LONG TERM GOAL #3   Title improve gait velocity to >/= 2.62 ft/sec for improved community access    Baseline 2.55 ft/sec on 10/22   Time 8   Period Weeks   Status Revised   Target Date 03/20/17     PT LONG TERM GOAL #4   Title amb > 1,000 feet on uneven paved, curb and grassy outdoor surfaces at MOD I for improved mobility to enter/exit work   Baseline amb >1,000 feet on uneven paved outdoor surfaces at Supervision on 8/20   Time 8   Period Weeks   Status Revised   Target Date 03/20/17     PT LONG TERM GOAL #5   Title Pt will demonstrate safe ascending/descending A-frame ladder while carrying object up to 15lb in one hand, MOD I   Time 8   Period Weeks   Status Revised   Target Date 03/20/17     PT LONG TERM GOAL #6   Title Pt will demonstrate safe lifting techniques (up to 25 lb) from various height surfaces and will carry objects x 150' over level surfaces with improved body mechanics. (03/20/2017)   Time 8   Period Weeks   Status Revised     PT LONG TERM GOAL #7   Title Will improve FOTO Neuro QOL LE by 20 points (03/20/2017)   Baseline 40   Time 8   Period Weeks   Status On-going     PT LONG TERM GOAL #8   Title Pt will improve FGA to > or = 22/30 in order to indicate decreased fall risk.  (03/20/2017)   Baseline 19/30 on 10/22   Time 8   Period Weeks   Status Revised               Plan - 02/24/17 1212    Clinical Impression Statement Patient was able to improve LE coordination with skilled instruction & repetition with exercises & activities. Patient had less gait ataxia exiting PT gym compared to arrival. Patient reports dry needing is too painful compared to benefits so wants to stop it.    Clinical Impairments Affecting Rehab Potential recent lumbar surgery-pain and mm tightness   PT Frequency 2x / week   PT Duration 8 weeks   PT Treatment/Interventions ADLs/Self Care Home  Management;Cryotherapy;Electrical Stimulation;Moist Heat;Neuromuscular  re-education;Balance training;Therapeutic exercise;Therapeutic activities;Functional mobility training;Patient/family education;Gait training;DME Instruction;Stair training;Orthotic Fit/Training;Taping;Passive range of motion;Aquatic Therapy;Canalith Repostioning;Vestibular   PT Next Visit Plan *CHECK BP PRIOR TO EX* manually with XL cuff(goal BP 130/90);  Primary PT, Edman Circle, . Garen Lah PT; Continue TDN and NMR for improved movement patterns. review LE stretches, gaze adaptation (dizziness?), trampoline jumps: jump squats, split jumps, jacks, marching; mountain climbers UE on mat, treadmill forwards and backwards- faster speeds and inclines, less UE support, stairs without UE support, ladder, lifting technique. one more TPDN on November 1st   PT Home Exercise Plan Pt used stretch out strap in clinic for hamstring to combine with sitting hamstring stretch   Consulted and Agree with Plan of Care Patient      Patient will benefit from skilled therapeutic intervention in order to improve the following deficits and impairments:  Abnormal gait, Decreased balance, Decreased mobility, Decreased strength, Pain, Difficulty walking, Improper body mechanics, Decreased range of motion, Increased muscle spasms, Impaired flexibility, Decreased coordination, Decreased endurance, Dizziness, Impaired tone  Visit Diagnosis: Hemiplegia and hemiparesis following cerebral infarction affecting right non-dominant side (HCC)  Muscle weakness (generalized)  Other abnormalities of gait and mobility  Acute midline low back pain without sciatica  Unsteadiness on feet  Other lack of coordination     Problem List Patient Active Problem List   Diagnosis Date Noted  . Abnormality of gait as late effect of stroke 08/30/2016  . Hemiparesis affecting dominant side as late effect of cerebrovascular accident (CVA) (HCC) 08/02/2016  . Disorder  of lumbosacral plexus 08/02/2016  . Small vessel disease, cerebrovascular 07/05/2016  . Right sided weakness 07/03/2016  . Facial droop 07/03/2016  . Stroke (HCC) 07/03/2016  . Anemia   . Tachycardia   . Back pain 06/27/2016    Shamone Winzer PT, DPT 02/24/2017, 12:15 PM  [facility] [address] [place], [place], [address] Phone: [phone]   Fax:  [phone]  Name: Darrell Watson MRN: [id number] Date of Birth: 20-May-1960

## 2017-02-24 NOTE — Telephone Encounter (Signed)
-----   Message from Erick ColaceAndrew E Kirsteins, MD sent at 02/24/2017  6:27 AM EDT ----- Regarding: RE: disability question for genes Contact: [phone] The ladder restricion is likely permanenet the hours will be increased as endurance improves ----- Message ----- From: Jens SomKellner, Lisa L Sent: 02/19/2017   1:47 PM To: Erick ColaceAndrew E Kirsteins, MD Subject: disability question for genes                  Recd call from Maurine Ministerennis at [facility] - he questions the restritions on the disability forms it states can do no lifting climbing ladders or work more than 5 hours - when will that restriction be lifted???    Maurine MinisterDennis w [facility] [phone] x [phone]

## 2017-02-24 NOTE — Telephone Encounter (Signed)
They are until next appt then re eval

## 2017-02-24 NOTE — Telephone Encounter (Signed)
LEFT CONFIDENTIAL [facility]ICEMAIL FOR [facility]

## 2017-02-26 ENCOUNTER — Ambulatory Visit: Payer: Self-pay | Admitting: Physical Therapy

## 2017-02-26 ENCOUNTER — Encounter: Payer: Self-pay | Admitting: Physical Therapy

## 2017-02-26 ENCOUNTER — Ambulatory Visit: Payer: 59 | Admitting: Physical Therapy

## 2017-02-26 DIAGNOSIS — M545 Low back pain, unspecified: Secondary | ICD-10-CM

## 2017-02-26 DIAGNOSIS — R2681 Unsteadiness on feet: Secondary | ICD-10-CM

## 2017-02-26 DIAGNOSIS — I69353 Hemiplegia and hemiparesis following cerebral infarction affecting right non-dominant side: Secondary | ICD-10-CM

## 2017-02-26 DIAGNOSIS — R2689 Other abnormalities of gait and mobility: Secondary | ICD-10-CM

## 2017-02-26 DIAGNOSIS — M6281 Muscle weakness (generalized): Secondary | ICD-10-CM

## 2017-02-26 NOTE — Therapy (Signed)
[facility] [address] [place], [place], [address] Phone: [phone]   Fax:  [phone]  Physical Therapy Treatment  Patient Details  Name: Darrell Watson MRN: [id number] Date of Birth: [birth year] Referring Provider: Erick ColaceAndrew E Kirsteins, MD  Encounter Date: 02/26/2017      PT End of Session - 02/26/17 2132    Visit Number 35   Number of Visits 39   Date for PT Re-Evaluation 03/20/17   Authorization Type UHC-60 visit limit combined   Authorization - Visit Number 53   Authorization - Number of Visits 60   PT Start Time 1409   PT Stop Time 1452   PT Time Calculation (min) 43 min   Activity Tolerance Patient tolerated treatment well   Behavior During Therapy [facility] for tasks assessed/performed      Past Medical History:  Diagnosis Date  . Anemia   . Arthritis   . Stroke (HCC)   . Tachycardia     Past Surgical History:  Procedure Laterality Date  . ABDOMINAL EXPOSURE N/A 06/27/2016   Procedure: ABDOMINAL EXPOSURE;  Surgeon: Chuck Hinthristopher S Dickson, MD;  [facility];  Service: Vascular;  Laterality: N/A;  . ANTERIOR LUMBAR FUSION N/A 06/27/2016   Procedure: ANTERIOR LUMBAR FUSION L5-S1;  Surgeon: Venita Lickahari Brooks, MD;  Location: [facility] OR;  Service: Orthopedics;  Laterality: N/A;  Requests 3 hours  . FINGER AMPUTATION Left   . SHOULDER SURGERY      There were no vitals filed for this visit.      Subjective Assessment - 02/26/17 1423    Subjective Went [place] for son's competition; did a lot of walking.  Did well with only a little fatigue.  Is going back to work on Monday part time, needs to change appointments to afternoon.   Pertinent History lumbar fusion 06/27/16, L CVA with R hemiparesis   Limitations Standing;Walking   How long can you stand comfortably? 2-3 hours doing yard work-but LE fatigues quickly   How long can you walk comfortably? at least 10 min   Patient Stated Goals improve RLE strength; reports  RUE strength has improved from working out at gym; wants to be able to get back to work   Currently in Pain? No/denies                         [facility] Treatment/Exercise - 02/26/17 2124      Knee/Hip Exercises: Plyometrics   Bilateral Jumping 3 sets;10 reps;Other (comment)   Bilateral Jumping Limitations on trampoline performed split jumps, partial jumping jacks and quick feet marching.  Over ground in // bars performed jogging forwards x 4 reps with UE support on // bars and skipping to focus on plyometric strengthening                PT Education - 02/26/17 2130    Education provided Yes   Education Details Return to work; plan to D/C after next 4 visits; plan to continue to work on speed/power of movements   Person(s) Educated Patient   Methods Explanation   Comprehension Verbalized understanding          PT Short Term Goals - 02/18/17 0805      PT SHORT TERM GOAL #1   Title Pt will report 0/10 dizziness when descending stairs/head turns during gait and will demonstrate negative positional testing    Baseline see new vestibular goal below   Time 4  Period Weeks   Status Achieved     PT SHORT TERM GOAL #2   Title Pt will improve safety with gait as indicated by increase in FGA score to > or = 19/30   Baseline 16/30; 19/30 on 10/22   Time 4   Period Weeks   Status Achieved     PT SHORT TERM GOAL #3   Title Pt will decrease falls risk in community as indicated by increase in gait velocity to > or = to 2.5 ft/sec   Baseline decreased to 2.46 ft/sec due to dizziness; 2.55 ft/sec on 10/22   Time 4   Period Weeks   Status Achieved     PT SHORT TERM GOAL #4   Title Pt will demonstrate independence with gaze adaptation HEP and improved use of VOR during mobility as indicated by DVA 2 line difference   Baseline 3 line difference on 01/23/17   Status On-going           PT Long Term Goals - 02/18/17 0813      PT LONG TERM GOAL #1    Title Pt will be independent with HEP and will demonstrate safe technique for performing quad, glute, hamstring, gastroc/DF strengthening at gym and safe use of treadmill (use of machines)   Time 8   Period Weeks   Status Revised   Target Date 03/20/17     PT LONG TERM GOAL #3   Title improve gait velocity to >/= 2.62 ft/sec for improved community access    Baseline 2.55 ft/sec on 10/22   Time 8   Period Weeks   Status Revised   Target Date 03/20/17     PT LONG TERM GOAL #4   Title amb > 1,000 feet on uneven paved, curb and grassy outdoor surfaces at MOD I for improved mobility to enter/exit work   Baseline amb >1,000 feet on uneven paved outdoor surfaces at Supervision on 8/20   Time 8   Period Weeks   Status Revised   Target Date 03/20/17     PT LONG TERM GOAL #5   Title Pt will demonstrate safe ascending/descending A-frame ladder while carrying object up to 15lb in one hand, MOD I   Time 8   Period Weeks   Status Revised   Target Date 03/20/17     PT LONG TERM GOAL #6   Title Pt will demonstrate safe lifting techniques (up to 25 lb) from various height surfaces and will carry objects x 150' over level surfaces with improved body mechanics. (03/20/2017)   Time 8   Period Weeks   Status Revised     PT LONG TERM GOAL #7   Title Will improve FOTO Neuro QOL LE by 20 points (03/20/2017)   Baseline 40   Time 8   Period Weeks   Status On-going     PT LONG TERM GOAL #8   Title Pt will improve FGA to > or = 22/30 in order to indicate decreased fall risk.  (03/20/2017)   Baseline 19/30 on 10/22   Time 8   Period Weeks   Status Revised               Plan - 02/26/17 2135    Clinical Impression Statement Final dry needling appointment cancelled this week; pt requesting to continue with neuro therapy.  Pt to return to work on Monday.  Continued to focus on plyometric and power training on trampoline and over ground for increased speed and coordination of  reciprocal LE  movements with improved motor planning and LE ROM today.  Pt still experiences LE fatigue and foot drag with faster movements (jogging).  Will continue to address and will continue to simulate work tasks as pt returns to work next week.   Clinical Impairments Affecting Rehab Potential recent lumbar surgery-pain and mm tightness   PT Frequency 2x / week   PT Duration 8 weeks   PT Treatment/Interventions ADLs/Self Care Home Management;Cryotherapy;Electrical Stimulation;Moist Heat;Neuromuscular re-education;Balance training;Therapeutic exercise;Therapeutic activities;Functional mobility training;Patient/family education;Gait training;DME Instruction;Stair training;Orthotic Fit/Training;Taping;Passive range of motion;Aquatic Therapy;Canalith Repostioning;Vestibular   PT Next Visit Plan *CHECK BP PRIOR TO EX* manually with XL cuff(goal BP 130/90);   NMR for improved movement patterns. review LE stretches, trampoline jumps: jump squats, split jumps, jacks, marching; mountain climbers UE on mat, treadmill forwards and backwards- faster speeds and inclines, less UE support, stairs without UE support, ladder, lifting technique.   PT Home Exercise Plan Pt used stretch out strap in clinic for hamstring to combine with sitting hamstring stretch   Consulted and Agree with Plan of Care Patient      Patient will benefit from skilled therapeutic intervention in order to improve the following deficits and impairments:  Abnormal gait, Decreased balance, Decreased mobility, Decreased strength, Pain, Difficulty walking, Improper body mechanics, Decreased range of motion, Increased muscle spasms, Impaired flexibility, Decreased coordination, Decreased endurance, Dizziness, Impaired tone  Visit Diagnosis: Hemiplegia and hemiparesis following cerebral infarction affecting right non-dominant side (HCC)  Muscle weakness (generalized)  Other abnormalities of gait and mobility  Acute midline low back pain without  sciatica  Unsteadiness on feet     Problem List Patient Active Problem List   Diagnosis Date Noted  . Abnormality of gait as late effect of stroke 08/30/2016  . Hemiparesis affecting dominant side as late effect of cerebrovascular accident (CVA) (HCC) 08/02/2016  . Disorder of lumbosacral plexus 08/02/2016  . Small vessel disease, cerebrovascular 07/05/2016  . Right sided weakness 07/03/2016  . Facial droop 07/03/2016  . Stroke (HCC) 07/03/2016  . Anemia   . Tachycardia   . Back pain 06/27/2016   Dierdre Highman, PT, DPT 02/26/17    9:42 PM    [facility] [address] [place], [place], [address] Phone: [phone]   Fax:  [phone]  Name: Darrell Watson MRN: [id number] Date of Birth: 11/18/1960

## 2017-02-27 ENCOUNTER — Telehealth: Payer: Self-pay | Admitting: Physical Medicine & Rehabilitation

## 2017-02-27 ENCOUNTER — Encounter: Payer: Self-pay | Admitting: Physical Therapy

## 2017-02-27 NOTE — Telephone Encounter (Signed)
Nurse Case manager asked that you put in writing the "clarification" on his restrictions originally stated.  I left him a voicemail and he states they would need that in paperform - can you generate a quick note/letter about the ladder, etc..

## 2017-02-28 ENCOUNTER — Encounter: Payer: Self-pay | Admitting: Physical Medicine & Rehabilitation

## 2017-03-03 ENCOUNTER — Ambulatory Visit: Payer: 59 | Attending: Physical Medicine & Rehabilitation

## 2017-03-03 VITALS — BP 158/110 | HR 106

## 2017-03-03 DIAGNOSIS — M6281 Muscle weakness (generalized): Secondary | ICD-10-CM | POA: Insufficient documentation

## 2017-03-03 DIAGNOSIS — R2689 Other abnormalities of gait and mobility: Secondary | ICD-10-CM | POA: Insufficient documentation

## 2017-03-03 DIAGNOSIS — I69353 Hemiplegia and hemiparesis following cerebral infarction affecting right non-dominant side: Secondary | ICD-10-CM | POA: Insufficient documentation

## 2017-03-03 DIAGNOSIS — R2681 Unsteadiness on feet: Secondary | ICD-10-CM | POA: Insufficient documentation

## 2017-03-03 NOTE — Therapy (Signed)
[facility] [address] [place], [place], [address] Phone: [phone]   Fax:  [phone]  Physical Therapy Treatment  Patient Details  Name: Darrell Watson MRN: [id number] Date of Birth: [birth year] Referring Provider: Erick ColaceAndrew E Kirsteins, MD   Encounter Date: 03/03/2017  PT End of Session - 03/03/17 1517    Visit Number  35 no charge   no charge   Number of Visits  39    Date for PT Re-Evaluation  03/20/17    Authorization Type  U[employer]-60 visit limit combined    Authorization - Visit Number  53    Authorization - Number of Visits  60    PT Start Time  1450    PT Stop Time  1510    PT Time Calculation (min)  20 min    Activity Tolerance  Treatment limited secondary to medical complications (Comment) elevated BP   elevated BP   Behavior During Therapy  [facility] for tasks assessed/performed       Past Medical History:  Diagnosis Date  . Anemia   . Arthritis   . Stroke (HCC)   . Tachycardia     Past Surgical History:  Procedure Laterality Date  . FINGER AMPUTATION Left   . SHOULDER SURGERY      Vitals:   03/03/17 1459 03/03/17 1500  BP: (!) 160/104 (!) 158/110  Pulse: (!) 106   SpO2: 98%     Subjective Assessment - 03/03/17 1458    Subjective  Pt reported he went back to work this morning, for 5 hours. Pt stated work was stressful today, as a machine broke down. Pt denied falls since last visit.     Pertinent History  lumbar fusion 06/27/16, L CVA with R hemiparesis    Patient Stated Goals  improve RLE strength; reports RUE strength has improved from working out at gym; wants to be able to get back to work    Currently in Pain?  No/denies                              PT Education - 03/03/17 1516    Education provided  Yes    Education Details  PT educated pt on the importance of informing PCP regarding elevated BP. PT discussed that BP was elevated and PT would not perform session  today, and encouraged pt to not go to gym to lift weights (as this could lead to incr. BP).     Person(s) Educated  Patient    Methods  Explanation    Comprehension  Verbalized understanding       PT Short Term Goals - 02/18/17 0805      PT SHORT TERM GOAL #1   Title  Pt will report 0/10 dizziness when descending stairs/head turns during gait and will demonstrate negative positional testing     Baseline  see new vestibular goal below    Time  4    Period  Weeks    Status  Achieved      PT SHORT TERM GOAL #2   Title  Pt will improve safety with gait as indicated by increase in FGA score to > or = 19/30    Baseline  16/30; 19/30 on 10/22    Time  4    Period  Weeks    Status  Achieved      PT SHORT TERM GOAL #3   Title  Pt  will decrease falls risk in community as indicated by increase in gait velocity to > or = to 2.5 ft/sec    Baseline  decreased to 2.46 ft/sec due to dizziness; 2.55 ft/sec on 10/22    Time  4    Period  Weeks    Status  Achieved      PT SHORT TERM GOAL #4   Title  Pt will demonstrate independence with gaze adaptation HEP and improved use of VOR during mobility as indicated by DVA 2 line difference    Baseline  3 line difference on 01/23/17    Status  On-going        PT Long Term Goals - 02/18/17 0813      PT LONG TERM GOAL #1   Title  Pt will be independent with HEP and will demonstrate safe technique for performing quad, glute, hamstring, gastroc/DF strengthening at gym and safe use of treadmill (use of machines)    Time  8    Period  Weeks    Status  Revised    Target Date  03/20/17      PT LONG TERM GOAL #3   Title  improve gait velocity to >/= 2.62 ft/sec for improved community access     Baseline  2.55 ft/sec on 10/22    Time  8    Period  Weeks    Status  Revised    Target Date  03/20/17      PT LONG TERM GOAL #4   Title  amb > 1,000 feet on uneven paved, curb and grassy outdoor surfaces at MOD I for improved mobility to enter/exit work     Baseline  amb >1,000 feet on uneven paved outdoor surfaces at Supervision on 8/20    Time  8    Period  Weeks    Status  Revised    Target Date  03/20/17      PT LONG TERM GOAL #5   Title  Pt will demonstrate safe ascending/descending A-frame ladder while carrying object up to 15lb in one hand, MOD I    Time  8    Period  Weeks    Status  Revised    Target Date  03/20/17      PT LONG TERM GOAL #6   Title  Pt will demonstrate safe lifting techniques (up to 25 lb) from various height surfaces and will carry objects x 150' over level surfaces with improved body mechanics. (03/20/2017)    Time  8    Period  Weeks    Status  Revised      PT LONG TERM GOAL #7   Title  Will improve FOTO Neuro QOL LE by 20 points (03/20/2017)    Baseline  40    Time  8    Period  Weeks    Status  On-going      PT LONG TERM GOAL #8   Title  Pt will improve FGA to > or = 22/30 in order to indicate decreased fall risk.  (03/20/2017)    Baseline  19/30 on 10/22    Time  8    Period  Weeks    Status  Revised            Plan - 03/03/17 1517    Clinical Impression Statement  No charge for today, as pt's BP was elevated.  PT will send note to MD.   Clinical Impairments Affecting Rehab Potential  recent lumbar surgery-pain and mm tightness  PT Frequency  2x / week    PT Duration  8 weeks    PT Treatment/Interventions  ADLs/Self Care Home Management;Cryotherapy;Electrical Stimulation;Moist Heat;Neuromuscular re-education;Balance training;Therapeutic exercise;Therapeutic activities;Functional mobility training;Patient/family education;Gait training;DME Instruction;Stair training;Orthotic Fit/Training;Taping;Passive range of motion;Aquatic Therapy;Canalith Repostioning;Vestibular    PT Next Visit Plan  *CHECK BP PRIOR TO EX* manually with XL cuff(goal BP 130/90);   NMR for improved movement patterns. review LE stretches, trampoline jumps: jump squats, split jumps, jacks, marching; mountain climbers UE  on mat, treadmill forwards and backwards- faster speeds and inclines, less UE support, stairs without UE support, ladder, lifting technique.    PT Home Exercise Plan  Pt used stretch out strap in clinic for hamstring to combine with sitting hamstring stretch    Consulted and Agree with Plan of Care  Patient       Patient will benefit from skilled therapeutic intervention in order to improve the following deficits and impairments:  Abnormal gait, Decreased balance, Decreased mobility, Decreased strength, Pain, Difficulty walking, Improper body mechanics, Decreased range of motion, Increased muscle spasms, Impaired flexibility, Decreased coordination, Decreased endurance, Dizziness, Impaired tone  Visit Diagnosis: Other abnormalities of gait and mobility     Problem List Patient Active Problem List   Diagnosis Date Noted  . Abnormality of gait as late effect of stroke 08/30/2016  . Hemiparesis affecting dominant side as late effect of cerebrovascular accident (CVA) (HCC) 08/02/2016  . Disorder of lumbosacral plexus 08/02/2016  . Small vessel disease, cerebrovascular 07/05/2016  . Right sided weakness 07/03/2016  . Facial droop 07/03/2016  . Stroke (HCC) 07/03/2016  . Anemia   . Tachycardia   . Back pain 06/27/2016    Shray Hunley L 03/03/2017, 3:19 PM  [facility] [address] [place], [place], [address] Phone: [phone]   Fax:  [phone]  Name: Darrell Watson MRN: [id number] Date of Birth: 10-24-1960  Zerita Boers, PT,DPT 03/03/17 3:19 PM Phone: [phone] Fax: [phone]

## 2017-03-06 ENCOUNTER — Ambulatory Visit: Payer: Self-pay | Admitting: Physical Therapy

## 2017-03-06 NOTE — Telephone Encounter (Signed)
Dennis is aware 

## 2017-03-07 ENCOUNTER — Ambulatory Visit: Payer: 59 | Admitting: Physical Therapy

## 2017-03-10 ENCOUNTER — Encounter: Payer: Self-pay | Admitting: Physical Therapy

## 2017-03-10 ENCOUNTER — Ambulatory Visit: Payer: 59 | Admitting: Physical Therapy

## 2017-03-10 VITALS — BP 170/94

## 2017-03-10 DIAGNOSIS — I69353 Hemiplegia and hemiparesis following cerebral infarction affecting right non-dominant side: Secondary | ICD-10-CM

## 2017-03-10 DIAGNOSIS — R2689 Other abnormalities of gait and mobility: Secondary | ICD-10-CM | POA: Diagnosis not present

## 2017-03-10 DIAGNOSIS — R2681 Unsteadiness on feet: Secondary | ICD-10-CM | POA: Diagnosis present

## 2017-03-10 DIAGNOSIS — M6281 Muscle weakness (generalized): Secondary | ICD-10-CM | POA: Diagnosis not present

## 2017-03-10 NOTE — Therapy (Signed)
[facility] [address] [place], [place], [address] Phone: [phone]   Fax:  [phone]  Physical Therapy Treatment  Patient Details  Name: Darrell Watson MRN: [id number] Date of Birth: 13-Jul-1960 Referring Provider: Charlett Blake, MD   Encounter Date: 03/10/2017  PT End of Session - 03/10/17 1607    Visit Number  36    Number of Visits  39    Date for PT Re-Evaluation  03/20/17    Authorization Type  UHC-60 visit limit combined    Authorization - Visit Number  28    Authorization - Number of Visits  60    PT Start Time  1601    PT Stop Time  1642    PT Time Calculation (min)  41 min    Equipment Utilized During Treatment  Gait belt    Activity Tolerance  Patient tolerated treatment well    Behavior During Therapy  WFL for tasks assessed/performed       Past Medical History:  Diagnosis Date  . Anemia   . Arthritis   . Stroke ([place])   . Tachycardia     Past Surgical History:  Procedure Laterality Date  . FINGER AMPUTATION Left   . SHOULDER SURGERY      Vitals:   03/10/17 1603  BP: (!) 170/94    Subjective Assessment - 03/10/17 1603    Subjective  Has been back to work. After working all week, he went home Friday and fell asleep waiting for his appt time, why he missed that day. No falls. Has not been checking his BP at home either.    Pertinent History  lumbar fusion 06/27/16, L CVA with R hemiparesis    Limitations  Standing;Walking    How long can you stand comfortably?  2-3 hours doing yard work-but LE fatigues quickly    How long can you walk comfortably?  at least 10 min    Patient Stated Goals  improve RLE strength; reports RUE strength has improved from working out at gym; wants to be able to get back to work    Currently in Pain?  No/denies    Pain Score  0-No pain         [facility] PT Assessment - 03/10/17 1611      Ambulation/Gait   Ambulation/Gait  Yes    Ambulation/Gait  Assistance  5: Supervision    Ambulation Distance (Feet)  210 Feet    Assistive device  None    Gait Pattern  Step-through pattern;Decreased hip/knee flexion - right;Poor foot clearance - right    Ambulation Surface  Level;Indoor    Gait velocity  12.72 sec's= 2.58 ft/sec no AD      Functional Gait  Assessment   Gait assessed   Yes    Gait Level Surface  Walks 20 ft in less than 7 sec but greater than 5.5 sec, uses assistive device, slower speed, mild gait deviations, or deviates 6-10 in outside of the 12 in walkway width. 7  sec's    Change in Gait Speed  Able to smoothly change walking speed without loss of balance or gait deviation. Deviate no more than 6 in outside of the 12 in walkway width.    Gait with Horizontal Head Turns  Performs head turns smoothly with slight change in gait velocity (eg, minor disruption to smooth gait path), deviates 6-10 in outside 12 in walkway width, or uses an assistive device.    Gait with Vertical  Head Turns  Performs head turns with no change in gait. Deviates no more than 6 in outside 12 in walkway width.    Gait and Pivot Turn  Pivot turns safely within 3 sec and stops quickly with no loss of balance.    Step Over Obstacle  Is able to step over 2 stacked shoe boxes taped together (9 in total height) without changing gait speed. No evidence of imbalance.    Gait with Narrow Base of Support  Is able to ambulate for 10 steps heel to toe with no staggering.    Gait with Eyes Closed  Walks 20 ft, uses assistive device, slower speed, mild gait deviations, deviates 6-10 in outside 12 in walkway width. Ambulates 20 ft in less than 9 sec but greater than 7 sec.    Ambulating Backwards  Walks 20 ft, uses assistive device, slower speed, mild gait deviations, deviates 6-10 in outside 12 in walkway width.    Steps  Alternating feet, must use rail.    Total Score  25    FGA comment:  25/30= low fall risk          [facility] Adult PT Treatment/Exercise - 03/10/17 1611       Self-Care   Self-Care  Other Self-Care Comments    Other Self-Care Comments   discussed benefits of an aquatic program to decrease tone and tightness, while continuing to address strengthening. Pt to consider this, however stated he prefers the gym.       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 Letta Pate return to work instructions), carried crate for 150 feet with min guard assist. demo'd good body mechanics with lifting from floor and returning to floor.            PT Short Term Goals - 02/18/17 0805      PT SHORT TERM GOAL #1   Title  Pt will report 0/10 dizziness when descending stairs/head turns during gait and will demonstrate negative positional testing     Baseline  see new vestibular goal below    Time  4    Period  Weeks    Status  Achieved      PT SHORT TERM GOAL #2   Title  Pt will improve safety with gait as indicated by increase in FGA score to > or = 19/30    Baseline  16/30; 19/30 on 10/22    Time  4    Period  Weeks    Status  Achieved      PT SHORT TERM GOAL #3   Title  Pt will decrease falls risk in community as indicated by increase in gait velocity to > or = to 2.5 ft/sec    Baseline  decreased to 2.46 ft/sec due to dizziness; 2.55 ft/sec on 10/22    Time  4    Period  Weeks    Status  Achieved      PT SHORT TERM GOAL #4   Title  Pt will demonstrate independence with gaze adaptation HEP and improved use of VOR during mobility as indicated by DVA 2 line difference    Baseline  3 line difference on 01/23/17    Status  On-going        PT Long Term Goals - 03/10/17 1608      PT LONG TERM GOAL #1   Title  Pt will be independent with HEP  and will demonstrate safe technique for performing quad, glute, hamstring, gastroc/DF strengthening at gym and safe use of treadmill (use of machines)    Time  8    Period  Weeks    Status  On-going      PT LONG TERM GOAL #3    Title  improve gait velocity to >/= 2.62 ft/sec for improved community access     Baseline  03/10/17: 2.58 ft/sec no AD, slight improvment, not to goal    Time  --    Period  --    Status  On-going      PT LONG TERM GOAL #4   Title  amb > 1,000 feet on uneven paved, curb and grassy outdoor surfaces at MOD I for improved mobility to enter/exit work    Baseline  03/10/17: met as pt has returned to work and walking > 1000 feet on all surfaces to enter/exit work    Status  Achieved      PT LONG TERM GOAL #5   Title  Pt will demonstrate safe ascending/descending A-frame ladder while carrying object up to 15lb in one hand, MOD I    Baseline  03/10/17: has been restricted from doing this permananlty by Dr. Letta Pate    Time  --    Period  --    Status  Deferred      PT LONG TERM GOAL #6   Title  Pt will demonstrate safe lifting techniques (up to 25 lb) from various height surfaces and will carry objects x 150' over level surfaces with improved body mechanics. (03/20/2017)    Baseline  03/10/17: 20# crate (due to md restrictions)- lifted to/from floor with good body mechanics and carried it 150 feet with min gaurd assist using good mechanics    Time  --    Period  --    Status  Achieved      PT LONG TERM GOAL #7   Title  Will improve FOTO Neuro QOL LE by 20 points (03/20/2017)    Baseline  03/10/17: 42.8 (increased by 2.8 points)    Time  --    Period  --    Status  Not Met      PT LONG TERM GOAL #8   Title  Pt will improve FGA to > or = 22/30 in order to indicate decreased fall risk.  (03/20/2017)    Baseline  03/10/17: 25/30 scored today    Time  --    Period  --    Status  Achieved            Plan - 03/10/17 1607    Clinical Impression Statement  Today's skilled session began to look at LTGs for anticipated discharge at end of the week. Pt has met 3 goals, partially met 1 goal and did not meet a goal (Neuro QOL). One goal is deferred due to MD restrictions. Will check the  remaining LTG next session.     Clinical Impairments Affecting Rehab Potential  recent lumbar surgery-pain and mm tightness    PT Frequency  2x / week    PT Duration  8 weeks    PT Treatment/Interventions  ADLs/Self Care Home Management;Cryotherapy;Electrical Stimulation;Moist Heat;Neuromuscular re-education;Balance training;Therapeutic exercise;Therapeutic activities;Functional mobility training;Patient/family education;Gait training;DME Instruction;Stair training;Orthotic Fit/Training;Taping;Passive range of motion;Aquatic Therapy;Canalith Repostioning;Vestibular    PT Next Visit Plan  *CHECK BP PRIOR TO EX* manually with XL cuff(goal BP 130/90); Check remaining goal for exercise program; recheck gait speed goal if time as pt  was close to meeting it.     PT Home Exercise Plan  Pt used stretch out strap in clinic for hamstring to combine with sitting hamstring stretch    Consulted and Agree with Plan of Care  Patient       Patient will benefit from skilled therapeutic intervention in order to improve the following deficits and impairments:  Abnormal gait, Decreased balance, Decreased mobility, Decreased strength, Pain, Difficulty walking, Improper body mechanics, Decreased range of motion, Increased muscle spasms, Impaired flexibility, Decreased coordination, Decreased endurance, Dizziness, Impaired tone  Visit Diagnosis: Other abnormalities of gait and mobility  Hemiplegia and hemiparesis following cerebral infarction affecting right non-dominant side (HCC)  Muscle weakness (generalized)  Unsteadiness on feet     Problem List Patient Active Problem List   Diagnosis Date Noted  . Abnormality of gait as late effect of stroke 08/30/2016  . Hemiparesis affecting dominant side as late effect of cerebrovascular accident (CVA) ([place]) 08/02/2016  . Disorder of lumbosacral plexus 08/02/2016  . Small vessel disease, cerebrovascular 07/05/2016  . Right sided weakness 07/03/2016  . Facial droop  07/03/2016  . Stroke ([place]) 07/03/2016  . Anemia   . Tachycardia   . Back pain 06/27/2016    Willow Ora, PTA, [facility] [address] [place], [place] [address] [phone] 03/10/17, 5:14 PM   Name: DUANNE DUCHESNE MRN: [id number] Date of Birth: [birth year]

## 2017-03-11 ENCOUNTER — Other Ambulatory Visit: Payer: Self-pay | Admitting: Internal Medicine

## 2017-03-11 DIAGNOSIS — I6340 Cerebral infarction due to embolism of unspecified cerebral artery: Secondary | ICD-10-CM | POA: Diagnosis not present

## 2017-03-11 DIAGNOSIS — Z0000 Encounter for general adult medical examination without abnormal findings: Secondary | ICD-10-CM | POA: Diagnosis not present

## 2017-03-11 DIAGNOSIS — R911 Solitary pulmonary nodule: Secondary | ICD-10-CM | POA: Diagnosis not present

## 2017-03-11 DIAGNOSIS — E291 Testicular hypofunction: Secondary | ICD-10-CM | POA: Diagnosis not present

## 2017-03-11 DIAGNOSIS — I10 Essential (primary) hypertension: Secondary | ICD-10-CM | POA: Diagnosis not present

## 2017-03-11 DIAGNOSIS — Z13220 Encounter for screening for lipoid disorders: Secondary | ICD-10-CM | POA: Diagnosis not present

## 2017-03-11 DIAGNOSIS — R739 Hyperglycemia, unspecified: Secondary | ICD-10-CM | POA: Diagnosis not present

## 2017-03-13 ENCOUNTER — Encounter: Payer: Self-pay | Admitting: Physical Therapy

## 2017-03-13 ENCOUNTER — Ambulatory Visit: Payer: 59 | Admitting: Physical Therapy

## 2017-03-13 VITALS — BP 120/85

## 2017-03-13 DIAGNOSIS — I69353 Hemiplegia and hemiparesis following cerebral infarction affecting right non-dominant side: Secondary | ICD-10-CM

## 2017-03-13 DIAGNOSIS — R2681 Unsteadiness on feet: Secondary | ICD-10-CM

## 2017-03-13 DIAGNOSIS — M6281 Muscle weakness (generalized): Secondary | ICD-10-CM

## 2017-03-13 DIAGNOSIS — R2689 Other abnormalities of gait and mobility: Secondary | ICD-10-CM

## 2017-03-13 NOTE — Therapy (Signed)
[facility] [address] [place], [place], [address] Phone: [phone]   Fax:  [phone]  Physical Therapy Treatment and D/C Summary  Patient Details  Name: Darrell Watson MRN: [id number] Date of Birth: 12/10/1960 Referring Provider: Charlett Blake, MD   Encounter Date: 03/13/2017  PT End of Session - 03/13/17 1453    Visit Number  37    Number of Visits  39    Date for PT Re-Evaluation  03/20/17 D/C today    Authorization Type  [employer]-60 visit limit combined    Authorization - Visit Number  36    Authorization - Number of Visits  60    PT Start Time  6962    PT Stop Time  1447    PT Time Calculation (min)  44 min    Activity Tolerance  Patient tolerated treatment well    Behavior During Therapy  [facility] for tasks assessed/performed       Past Medical History:  Diagnosis Date  . Anemia   . Arthritis   . Stroke ([place])   . Tachycardia     Past Surgical History:  Procedure Laterality Date  . ABDOMINAL EXPOSURE N/A 06/27/2016   Procedure: ABDOMINAL EXPOSURE;  Surgeon: Angelia Mould, MD;  Location: [facility];  Service: Vascular;  Laterality: N/A;  . ANTERIOR LUMBAR FUSION N/A 06/27/2016   Procedure: ANTERIOR LUMBAR FUSION L5-S1;  Surgeon: Melina Schools, MD;  Location: [facility];  Service: Orthopedics;  Laterality: N/A;  Requests 3 hours  . FINGER AMPUTATION Left   . SHOULDER SURGERY      Vitals:   03/13/17 1413  BP: 120/85    Subjective Assessment - 03/13/17 1411    Subjective  Walking into work was rough this morning with the wind and rain.  Work is going well, but it is a lot of walking and stairs.  Is very fatigued at the end of the day.  Gave his doctor his BP log for one month; MD is looking at adding medication..    Pertinent History  lumbar fusion 06/27/16, L CVA with R hemiparesis    Limitations  Standing;Walking    How long can you stand comfortably?  2-3 hours doing yard work-but LE fatigues quickly    How long can you walk comfortably?  at least 10 min    Patient Stated Goals  improve RLE strength; reports RUE strength has improved from working out at gym; wants to be able to get back to work    Currently in Pain?  No/denies      Reviewed and finalized exercises below for HEP at home and at the gym:   Supine Knee-to-Chest, Unilateral    Lie on back, hands clasped behind one knee. Pull knee in toward chest until a comfortable stretch is felt in lower back and buttocks. Hold __15_ seconds.  Repeat __2-3_ times each leg per session. Do _1-2__ sessions per day.  Copyright  VHI. All rights reserved.  Supine Knee-to-Chest, Bilateral    Lie on back, hands clasped behind both knees. Bring one leg up & down at a time.  Pull knees in toward chest until a comfortable stretch is felt in lower back and buttocks. Hold _15__ seconds. Repeat _2-3__ times per session. Do _1-2__ sessions per day.  Chair Sitting    Sit at edge of seat, spine straight, one leg extended. Put a hand on each thigh and bend forward from the hip, keeping spine straight. Allow hand on  extended leg to reach toward toes. Support upper body with other arm. Hold _30__ seconds. Repeat _2__ times per leg. Do __2_ sessions per day.  Hip Flexor Standing    Stand, left foot behind, heel on floor and turned slightly out, leg straight, forward leg bent. Move hips forward and then lean up and away from L side.  Should feel a stretch in front of left hip. Hold __30_ seconds. Repeat __2_ times per leg. Do _2__ sessions per day.   Walking on Toes    Walk on toes for 10 feet while continuing on a straight path.  Do 1sessions per day.  Copyright  VHI. All rights reserved.  Walking on Heels    Walk on heels for 10 feet while continuing on a straight path. Do 1sessions per day.  Copyright  VHI. All rights reserved.  Braiding    Move to side: 1) cross right leg in front of left, 2) bring back leg out to side, then  3) cross right leg behind left, 4) bring left leg out to side. Continue sequence in same direction. Reverse sequence, moving in opposite direction. Repeat sequence 2times per session. Do 1sessions per day.   Copyright  VHI. All rights reserved.  Feet Heel-Toe "Tandem"    Arms outstretched, walk a straight line bringing one foot directly in front of the other. Repeat for 10 feet per session. Do 1 sessions per day.  Bridging    Slowly raise buttocks from floor, keeping stomach tight.  Repeat __8__ times per set. Do __1-2__ sets per session. Do _1___ sessions per day.  Wall Sit    Back against wall, slide down so knees are at 70-80 angle. Hold _10-12___ seconds. Do _3-4___ sets.     Leg Exercises for the GYM (using gym machines):  ABDUCTION: Sitting - Resistance Band (Active)    Find machine that lets you push legs apart. Keep back flat against support.    Knee Flexion: Hamstring Drop (Eccentric) - Seated (Weight Machine)    Sit on machine. Pull feet back until knees are at 90. Try to primarily use right leg, with left leg "helping" as needed. Slowly release legs for 3-5 seconds. __8-10_ reps per set, __3_ sets per day, __3_ days per week.  http://ecce.exer.us/108   Copyright  VHI. All rights reserved.    ANKLE: Dorsiflexion / Plantarflexion - Sitting     Find the machine where you can sit and work your calves (pushing your toes away). Find a weight where you can tolerate 8-10 reps     PT Education - 03/13/17 1453    Education provided  Yes    Education Details  finalized HEP    Person(s) Educated  Patient    Methods  Explanation;Demonstration;Handout    Comprehension  Verbalized understanding;Returned demonstration       PT Short Term Goals - 02/18/17 0805      PT SHORT TERM GOAL #1   Title  Pt will report 0/10 dizziness when descending stairs/head turns during gait and will demonstrate negative positional testing     Baseline  see new  vestibular goal below    Time  4    Period  Weeks    Status  Achieved      PT SHORT TERM GOAL #2   Title  Pt will improve safety with gait as indicated by increase in FGA score to > or = 19/30    Baseline  16/30; 19/30 on 10/22    Time  4  Period  Weeks    Status  Achieved      PT SHORT TERM GOAL #3   Title  Pt will decrease falls risk in community as indicated by increase in gait velocity to > or = to 2.5 ft/sec    Baseline  decreased to 2.46 ft/sec due to dizziness; 2.55 ft/sec on 10/22    Time  4    Period  Weeks    Status  Achieved      PT SHORT TERM GOAL #4   Title  Pt will demonstrate independence with gaze adaptation HEP and improved use of VOR during mobility as indicated by DVA 2 line difference    Baseline  3 line difference on 01/23/17    Status  On-going        PT Long Term Goals - 03/13/17 1454      PT LONG TERM GOAL #1   Title  Pt will be independent with HEP and will demonstrate safe technique for performing quad, glute, hamstring, gastroc/DF strengthening at gym and safe use of treadmill (use of machines)    Time  8    Period  Weeks    Status  Achieved      PT LONG TERM GOAL #3   Title  improve gait velocity to >/= 2.62 ft/sec for improved community access     Baseline  03/10/17: 2.58 ft/sec no AD, slight improvment, not to goal    Status  Not Met      PT LONG TERM GOAL #4   Title  amb > 1,000 feet on uneven paved, curb and grassy outdoor surfaces at MOD I for improved mobility to enter/exit work    Baseline  03/10/17: met as pt has returned to work and walking > 1000 feet on all surfaces to enter/exit work    Status  Achieved      PT [place] #5   Title  Pt will demonstrate safe ascending/descending A-frame ladder while carrying object up to 15lb in one hand, MOD I    Baseline  03/10/17: has been restricted from doing this permananlty by Dr. Letta Pate    Status  Deferred      PT LONG TERM GOAL #6   Title  Pt will demonstrate safe lifting  techniques (up to 25 lb) from various height surfaces and will carry objects x 150' over level surfaces with improved body mechanics. (03/20/2017)    Baseline  03/10/17: 20# crate (due to md restrictions)- lifted to/from floor with good body mechanics and carried it 150 feet with min gaurd assist using good mechanics    Status  Achieved      PT LONG TERM GOAL #7   Title  Will improve FOTO Neuro QOL LE by 20 points (03/20/2017)    Baseline  03/10/17: 42.8 (increased by 2.8 points)    Status  Not Met      PT LONG TERM GOAL #8   Title  Pt will improve FGA to > or = 22/30 in order to indicate decreased fall risk.  (03/20/2017)    Baseline  03/10/17: 25/30 scored today    Status  Achieved            Plan - 03/13/17 1505    Clinical Impression Statement  Completed assessment of LTG with review and finalization of HEP including LE and low back stretches, strengthening exercises for the gym and dynamic standing balance exercises.  Pt tolerated all exercises well.  Pt has made steady progress  and has met 4/6 LTG with overall improvements in pain, LE ROM, strength, balance and gait sequencing and decreased dizziness; pt did demonstrate improvements in gait velocity but not to targeted goal; pt also did not meet Neuro QOL LE goal.  Pt to continue to address LE coordination, decreased ROM and impaired strength with gym exercises, HEP and significant amounts of walking at work inside and outside.  Pt to begin 8 hours of work next week so will D/C from therapy today.       PT Treatment/Interventions  ADLs/Self Care Home Management;Cryotherapy;Electrical Stimulation;Moist Heat;Neuromuscular re-education;Balance training;Therapeutic exercise;Therapeutic activities;Functional mobility training;Patient/family education;Gait training;DME Instruction;Stair training;Orthotic Fit/Training;Taping;Passive range of motion;Aquatic Therapy;Canalith Repostioning;Vestibular    Consulted and Agree with Plan of Care   Patient       Patient will benefit from skilled therapeutic intervention in order to improve the following deficits and impairments:  Abnormal gait, Decreased balance, Decreased mobility, Decreased strength, Pain, Difficulty walking, Improper body mechanics, Decreased range of motion, Increased muscle spasms, Impaired flexibility, Decreased coordination, Decreased endurance, Dizziness, Impaired tone  Visit Diagnosis: Hemiplegia and hemiparesis following cerebral infarction affecting right non-dominant side (HCC)  Muscle weakness (generalized)  Other abnormalities of gait and mobility  Unsteadiness on feet     Problem List Patient Active Problem List   Diagnosis Date Noted  . Abnormality of gait as late effect of stroke 08/30/2016  . Hemiparesis affecting dominant side as late effect of cerebrovascular accident (CVA) ([facility]) 08/02/2016  . Disorder of lumbosacral plexus 08/02/2016  . Small vessel disease, cerebrovascular 07/05/2016  . Right sided weakness 07/03/2016  . Facial droop 07/03/2016  . Stroke ([place]) 07/03/2016  . Anemia   . Tachycardia   . Back pain 06/27/2016   PHYSICAL THERAPY DISCHARGE SUMMARY  Visits from Start of Care: 37  Current functional level related to goals / functional outcomes: 4/6 LTG met; see impression statement above. Pt to begin work 8 hours/day next week.   Remaining deficits: Impaired LE strength, ROM, coordination, endurance, balance and gait   Education / Equipment: HEP  Plan: Patient agrees to discharge.  Patient goals were partially met. Patient is being discharged due to being pleased with the current functional level.  ?????    Rico Junker, PT, DPT 03/13/17    3:28 PM   [facility] [address] [place], [place], [address] Phone: [phone]   Fax:  [phone]  Name: JONANTHAN BOLENDER MRN: [id number] Date of Birth: 01/27/1961

## 2017-03-13 NOTE — Patient Instructions (Signed)
Supine Knee-to-Chest, Unilateral    Lie on back, hands clasped behind one knee. Pull knee in toward chest until a comfortable stretch is felt in lower back and buttocks. Hold __15_ seconds.  Repeat __2-3_ times each leg per session. Do _1-2__ sessions per day.  Copyright  VHI. All rights reserved.  Supine Knee-to-Chest, Bilateral    Lie on back, hands clasped behind both knees. Bring one leg up & down at a time.  Pull knees in toward chest until a comfortable stretch is felt in lower back and buttocks. Hold _15__ seconds. Repeat _2-3__ times per session. Do _1-2__ sessions per day.  Chair Sitting    Sit at edge of seat, spine straight, one leg extended. Put a hand on each thigh and bend forward from the hip, keeping spine straight. Allow hand on extended leg to reach toward toes. Support upper body with other arm. Hold _30__ seconds. Repeat _2__ times per leg. Do __2_ sessions per day.  Hip Flexor Standing    Stand, left foot behind, heel on floor and turned slightly out, leg straight, forward leg bent. Move hips forward and then lean up and away from L side.  Should feel a stretch in front of left hip. Hold __30_ seconds. Repeat __2_ times per leg. Do _2__ sessions per day.   Walking on Toes    Walk on toes for 10 feet while continuing on a straight path.  Do 1sessions per day.  Copyright  VHI. All rights reserved.  Walking on Heels    Walk on heels for 10 feet while continuing on a straight path. Do 1sessions per day.  Copyright  VHI. All rights reserved.  Braiding    Move to side: 1) cross right leg in front of left, 2) bring back leg out to side, then 3) cross right leg behind left, 4) bring left leg out to side. Continue sequence in same direction. Reverse sequence, moving in opposite direction. Repeat sequence 2times per session. Do 1sessions per day.   Copyright  VHI. All rights reserved.  Feet Heel-Toe "Tandem"    Arms outstretched, walk a  straight line bringing one foot directly in front of the other. Repeat for 10 feet per session. Do 1 sessions per day.  Bridging    Slowly raise buttocks from floor, keeping stomach tight.  Repeat __8__ times per set. Do __1-2__ sets per session. Do _1___ sessions per day.  Wall Sit    Back against wall, slide down so knees are at 70-80 angle. Hold _10-12___ seconds. Do _3-4___ sets.     Leg Exercises for the GYM (using gym machines):  ABDUCTION: Sitting - Resistance Band (Active)    Find machine that lets you push legs apart. Keep back flat against support.    Knee Flexion: Hamstring Drop (Eccentric) - Seated (Weight Machine)    Sit on machine. Pull feet back until knees are at 90. Try to primarily use right leg, with left leg "helping" as needed. Slowly release legs for 3-5 seconds. __8-10_ reps per set, __3_ sets per day, __3_ days per week.  http://ecce.exer.us/108   Copyright  VHI. All rights reserved.    ANKLE: Dorsiflexion / Plantarflexion - Sitting     Find the machine where you can sit and work your calves (pushing your toes away). Find a weight where you can tolerate 8-10 reps

## 2017-03-17 ENCOUNTER — Encounter: Payer: Self-pay | Admitting: Physical Medicine & Rehabilitation

## 2017-03-17 ENCOUNTER — Other Ambulatory Visit: Payer: Self-pay

## 2017-03-17 ENCOUNTER — Ambulatory Visit: Payer: 59 | Admitting: Physical Medicine & Rehabilitation

## 2017-03-17 ENCOUNTER — Ambulatory Visit
Admission: RE | Admit: 2017-03-17 | Discharge: 2017-03-17 | Disposition: A | Discharge status: Home / Self Care | Payer: 59 | Source: Ambulatory Visit | Attending: Internal Medicine | Admitting: Internal Medicine

## 2017-03-17 ENCOUNTER — Encounter: Payer: 59 | Attending: Physical Medicine & Rehabilitation

## 2017-03-17 VITALS — BP 133/80 | HR 106

## 2017-03-17 DIAGNOSIS — I69359 Hemiplegia and hemiparesis following cerebral infarction affecting unspecified side: Secondary | ICD-10-CM

## 2017-03-17 DIAGNOSIS — Z981 Arthrodesis status: Secondary | ICD-10-CM | POA: Insufficient documentation

## 2017-03-17 DIAGNOSIS — R269 Unspecified abnormalities of gait and mobility: Secondary | ICD-10-CM | POA: Diagnosis not present

## 2017-03-17 DIAGNOSIS — I69351 Hemiplegia and hemiparesis following cerebral infarction affecting right dominant side: Secondary | ICD-10-CM | POA: Insufficient documentation

## 2017-03-17 DIAGNOSIS — R918 Other nonspecific abnormal finding of lung field: Secondary | ICD-10-CM | POA: Diagnosis not present

## 2017-03-17 DIAGNOSIS — I69398 Other sequelae of cerebral infarction: Secondary | ICD-10-CM | POA: Diagnosis not present

## 2017-03-17 DIAGNOSIS — R911 Solitary pulmonary nodule: Secondary | ICD-10-CM

## 2017-03-17 DIAGNOSIS — G541 Lumbosacral plexus disorders: Secondary | ICD-10-CM | POA: Diagnosis not present

## 2017-03-17 NOTE — Progress Notes (Signed)
Subjective:    Patient ID: Darrell Watson, male    DOB: Jan 02, 1961, 56 y.o.   MRN: [id number]  HPI  Up to 8 hours per day for this week, Tolerated 5 hours per day work Upper body getting stronger Lower body is lagging Patient denies any pain complaints in his back area.  He did initially get very fatigued after work.  This has been improving.  He has a very long walk from the parking lot to his workplace despite getting handicap parking.  He does have access to rides with the golf cart but he prefers to walk. He is working out in [employer] gym about 4 PM after work.  Works out most days out of the week.  Pain Inventory Average Pain 0 Pain Right Now 0 My pain is no pain  In the last 24 hours, has pain interfered with the following? General activity 0 Relation with others 0 Enjoyment of life 0 What TIME of day is your pain at its worst? no pain Sleep (in general) no pain  Pain is worse with: no pain Pain improves with: no pain Relief from Meds: 0  Mobility walk without assistance ability to climb steps?  yes do you drive?  yes  Function employed # of hrs/week 8  Neuro/Psych No problems in this area  Prior Studies Any changes since last visit?  no  Physicians involved in your care Any changes since last visit?  no   No family history on file. Social History   Socioeconomic History  . Marital status: Married    Spouse name: None  . Number of children: None  . Years of education: None  . Highest education level: None  Social Needs  . Financial resource strain: None  . Food insecurity - worry: None  . Food insecurity - inability: None  . Transportation needs - medical: None  . Transportation needs - non-medical: None  Occupational History  . None  Tobacco Use  . Smoking status: Never Smoker  . Smokeless tobacco: Never Used  Substance and Sexual Activity  . Alcohol use: Yes    Comment: BEER  OCC  . Drug use: No  . Sexual activity: None  Other Topics Concern  .  None  Social History Narrative  . None   Past Surgical History:  Procedure Laterality Date  . ABDOMINAL EXPOSURE N/A 06/27/2016   [facility]ickson[facility]  . ANTERIOR LUMBAR FUSION L5-S1 N/A 06/27/2016   Performed [facility] Venita LickBrooks, Dahari, [facility]  . FINGER AMPUTATION Left   . SHOULDER SURGERY     Past Medical History:  Diagnosis Date  . Anemia   . Arthritis   . Stroke (HCC)   . Tachycardia    BP 133/80   Pulse (!) 106   SpO2 99%   Opioid Risk Score:   Fall Risk Score:  `1  Depression screen PHQ 2/9  [facility] 03/17/2017 08/02/2016  Decreased Interest 0 0  Down, Depressed, Hopeless 0 0  PHQ - 2 Score 0 0  Altered sleeping - 0  Tired, decreased energy - 0  Change in appetite - 0  Feeling bad or failure about yourself  - 0  Trouble concentrating - 0  Moving slowly or fidgety/restless - 0  Suicidal thoughts - 0  PHQ-9 Score - 0  Difficult doing work/chores - Not difficult at all     Review of Systems     Objective:  Physical Exam  Constitutional: He appears well-developed and well-nourished. No distress.  HENT:  Head: Normocephalic and atraumatic.  Eyes: Conjunctivae and EOM are normal. Pupils are equal, round, and reactive to light.  Neck: Normal range of motion.  Skin: He is not diaphoretic.  Nursing note and vitals reviewed.  Motor strength is 5/5 bilateral deltoid bicep tricep grip hip flexor knee extensor ankle dorsiflexor Tone is normal bilateral upper and lower limbs Gait is stiff leg on the right side with cocontraction of the hamstrings and quadriceps during swing phase. Mood and affect are appropriate No evidence dysarthria      Assessment & Plan:  1.  History of left subcortical infarct with residual mild right hemiparesis.  His right grip is equal to his left grip despite being right arm dominant. He ambulates without assistive device.  He is modified independent with all his self-care and mobility.  He may  increase his work hours to 8 hours/day.  No ladders.  Return to clinic in 2 months

## 2017-03-27 ENCOUNTER — Other Ambulatory Visit (INDEPENDENT_AMBULATORY_CARE_PROVIDER_SITE_OTHER): Payer: 59

## 2017-03-27 ENCOUNTER — Ambulatory Visit (INDEPENDENT_AMBULATORY_CARE_PROVIDER_SITE_OTHER)
Admission: RE | Admit: 2017-03-27 | Discharge: 2017-03-27 | Disposition: A | Discharge status: Home / Self Care | Payer: 59 | Source: Ambulatory Visit | Attending: Internal Medicine | Admitting: Internal Medicine

## 2017-03-27 ENCOUNTER — Encounter: Payer: Self-pay | Admitting: Internal Medicine

## 2017-03-27 ENCOUNTER — Ambulatory Visit: Payer: 59 | Admitting: Internal Medicine

## 2017-03-27 VITALS — BP 130/84 | HR 104 | Ht 70.0 in | Wt 197.0 lb

## 2017-03-27 DIAGNOSIS — D860 Sarcoidosis of lung: Secondary | ICD-10-CM | POA: Insufficient documentation

## 2017-03-27 LAB — SEDIMENTATION RATE: SED RATE: 13 mm/h (ref 0–20)

## 2017-03-27 NOTE — Progress Notes (Signed)
Subjective:     Patient ID: Darrell Watson, male   DOB: 1960/09/18,     MRN: [id number]  HPI  [age] yobm never regular smoker works for [employer] in [employer] with incidental MPN on Abd CT 07/03/16 > full Chst CT 03/17/17 with MPN so referred to pulmonary clinic 03/27/2017 by Dr   Nehemiah SettlePolite    03/27/2017 1st [facility] office visit/ [facility]   Chief Complaint  Patient presents with  . Pulmonary Consult    Referred by Dr. Nehemiah SettlePolite for incidental pulmonary nodule. Pt not having any respiratory problems.    never told he had sarcoid, has min djd arthritis not suggestive of  CTD, Ca, no unusual exp hx and no symptoms at all except for new eye drainae issues on R s viz changes and neg w/u by opth  No   assoc excess/ purulent sputum or mucus plugs or hemoptysis or cp or chest tightness, subjective wheeze or overt sinus or hb symptoms. No unusual exposure hx or h/o childhood pna/ asthma or knowledge of premature birth.  Sleeping ok flat without nocturnal  or early am exacerbation  of respiratory  c/o's or need for noct saba. Also denies any obvious fluctuation of symptoms with weather or environmental changes or other aggravating or alleviating factors except as outlined above   Current Allergies, Complete Past Medical History, Past Surgical History, Family History, and Social History were reviewed in [facility] electronic medical record.  ROS  The following are not active complaints unless bolded Hoarseness, sore throat, dysphagia, dental problems, itching, sneezing,  nasal congestion or discharge of excess mucus or purulent secretions, ear ache,   fever, chills, sweats, unintended wt loss or wt gain, classically pleuritic or exertional cp,  orthopnea pnd or leg swelling, presyncope, palpitations, abdominal pain, anorexia, nausea, vomiting, diarrhea  or change in bowel habits or change in bladder habits, change in stools or change in urine, dysuria, hematuria,  rash, arthralgias, visual complaints, headache,  numbness, weakness or ataxia or problems with walking or coordination,  change in mood/affect or memory.        Current Meds  Medication Sig  . aspirin EC 81 MG EC tablet Take 1 tablet (81 mg total) by mouth daily.  Marland Kitchen. testosterone cypionate (DEPOTESTOSTERONE CYPIONATE) 200 MG/ML injection Inject 300 mg into the muscle every 14 (fourteen) days. Gets 1.5 ml at Dr office every 2 weeks Next dose is due 06-24-2016         Review of Systems     Objective:   Physical Exam    amb bm nad  Wt Readings from Last 3 Encounters:  03/27/17 197 lb (89.4 kg)  11/19/16 195 lb (88.5 kg)  11/04/16 194 lb (88 kg)    Vital signs reviewed Vital signs reviewed - Note on arrival 02 sats  97% on RA     HEENT: nl dentition, turbinates bilaterally, and oropharynx. Nl external ear canals without cough reflex   NECK :  without JVD/Nodes/TM/ nl carotid upstrokes bilaterally   LUNGS: no acc muscle use,  Nl contour chest which is clear to A and P bilaterally without cough on insp or exp maneuvers   CV:  RRR  no s3 or murmur or increase in P2, and no edema   ABD:  soft and nontender with nl inspiratory excursion in the supine position. 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    CXR PA and Lateral:   03/27/2017 :    I personally reviewed images and agree with radiology impression as follows:   Min increase hilar  fullness/ no real change since 07/03/16 - no RN dz   CT cjest 03/17/17 . Widespread nodularity throughout both lungs, predominantly in a perilymphatic distribution. There is some associated borderline enlarged mediastinal lymphadenopathy, and several borderline enlarged and mildly enlarged left axillary lymph  nodes.  Reviewed Dr Idelle CrouchPolite's lab from   03/11/17  Nl calcium/ alb/prot Eos 0.5    Lab Results  Component Value Date   ESRSEDRATE 13 03/27/2017   ESRSEDRATE 1 03/05/2012    Labs ordered 03/27/2017   Angiotensin level    Assessment:

## 2017-03-27 NOTE — Patient Instructions (Signed)
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     Please schedule a follow up visit in 3 months but call sooner if needed

## 2017-03-28 ENCOUNTER — Encounter: Payer: Self-pay | Admitting: Internal Medicine

## 2017-03-28 ENCOUNTER — Telehealth: Payer: Self-pay | Admitting: Internal Medicine

## 2017-03-28 LAB — ANGIOTENSIN CONVERTING ENZYME: Angiotensin-Converting Enzyme: 34 U/L (ref 9–67)

## 2017-03-28 NOTE — Progress Notes (Signed)
LMTCB

## 2017-03-28 NOTE — Telephone Encounter (Signed)
Called and spoke with pt regarding the results of his labwork and to continue with the recs discussed at OV.   Pt expressed understanding. Nothing further needed.

## 2017-03-28 NOTE — Telephone Encounter (Signed)
Patient returning call - he can be reached at [phone]

## 2017-03-28 NOTE — Assessment & Plan Note (Signed)
His cxr has not changed at all in 8 months and is c/w a pt with asymptomatic Sarcoid with the only pressing issue making sure he does not have occular involvement since this is only potential serious complication of the dz at this point  He will likely need FOB at some point but this can wait until set up f/u ov and ocular issues  are addressed as this is no proven benefit to "early intervention" in asymptomatic dz.    Total time devoted to counseling  > 50 % of initial 60 min office visit:  review case with pt/ discussion of options/alternatives/ personally creating written customized instructions  in presence of pt  then going over those specific  Instructions directly with the pt including how to use all of the meds but in particular covering each new medication in detail and the difference between the maintenance= "automatic" meds and the prns using an action plan format for the latter (If this problem/symptom => do that organization reading Left to right).  Please see AVS from this visit for a full list of these instructions which I personally wrote for this pt and  are unique to this visit.

## 2017-03-31 ENCOUNTER — Telehealth: Payer: Self-pay | Admitting: Internal Medicine

## 2017-03-31 NOTE — Progress Notes (Signed)
Spoke with Darrell Watson and notified of results per Dr. Wert. Darrell Watson verbalized understanding and denied any questions. 

## 2017-03-31 NOTE — Telephone Encounter (Signed)
Darrell Watson, Darrell B, MD sent to [name] M, CMA  Call pt: Reviewed cxr and no acute change so no change in recommendations made at ov  Spoke with pt and notified of results per Dr. Wert. Pt verbalized understanding and denied any questions.  

## 2017-04-17 ENCOUNTER — Encounter: Payer: 59 | Attending: Internal Medicine | Admitting: Registered"

## 2017-04-17 ENCOUNTER — Ambulatory Visit: Payer: Self-pay | Admitting: Registered"

## 2017-04-17 ENCOUNTER — Encounter: Payer: Self-pay | Admitting: Registered"

## 2017-04-17 DIAGNOSIS — E119 Type 2 diabetes mellitus without complications: Secondary | ICD-10-CM | POA: Diagnosis present

## 2017-04-17 DIAGNOSIS — Z713 Dietary counseling and surveillance: Secondary | ICD-10-CM | POA: Insufficient documentation

## 2017-04-17 NOTE — Patient Instructions (Signed)
Plan:  Aim for 4 Carb Choices per meal (60 grams) +/- 1 choice either way  Aim for 0-2 Carb Choices per snack if hungry (0-30 grams) Include protein with your meals and snacks Consider having a variety of meals for breakfast such as eggs, yogurt & pecans, cereal & nuts. Aim for 3 balanced meals per day, with not to heavy of a supper meal and not too late Consider reading food labels for Total Carbohydrate and Sat Fat Grams of foods Continue with your activity level daily as tolerated You can check blood sugar at alternate times per day to evaluate your progress Continue taking medication as directed by MD Adequate and restful sleep is important for your health and controlling blood sugar

## 2017-04-17 NOTE — Progress Notes (Signed)
Diabetes Self-Management Education  Visit Type: First/Initial  Appt. Start Time: 1405 Appt. End Time: 1530  04/17/2017  [name], identified by name and date of birth, is a 56 y.o. male with a diagnosis of Diabetes: Type 2.   ASSESSMENT Per chart A1c is 7.1% up from 6.5% 9 mo ago. Pt states he had back surgery in March followed by a stroke day 7 days later which he was told they don't know what caused stroke.   Patient states he does not eat a lot of carbs, but does have a sweet tooth and will frequently snack on a small candy bars before dinner. Patient states he drinks A2 milk, easier to digest.  Pt states he recently went back to work after being out for 10 months. 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 In addition pt reports lifting weights 4x week and has been doing for 30 years.   Patient reports he is a light sleeper states he usually wakes up 12 am, stays awake 2-3 hours and will watch TV until sleepy again. Pt states sleeping medication effectiveness wears off quickly. Pt reports drinking alkaseltzer 2-3x to help sleep.  Possible topics for next visit: evaluate protein drink after workout, review carb counting, discuss BG log.  Diabetes Self-Management Education - 04/17/17 1413      Visit Information   Visit Type  First/Initial      Initial Visit   Diabetes Type  Type 2    Are you currently following a meal plan?  No    Are you taking your medications as prescribed?  Yes    Date Diagnosed  4 weeks ago      Health Coping   How would you rate your overall health?  Fair      Psychosocial Assessment   Patient Belief/Attitude about Diabetes  Motivated to manage diabetes    How often do you need to have someone help you when you read instructions, pamphlets, or other written materials from your doctor or pharmacy?  1 - Never    What is the last grade level you completed in school?  12th      Complications   Last HgB A1C per patient/outside source  7.1 % per referral 03/12/17    How often do you check your blood sugar?  0 times/day (not testing)    Have you had a dilated eye exam in the past 12 months?  No    Have you had a dental exam in the past 12 months?  Yes    Are you checking your feet?  No      Dietary Intake   Breakfast  none OR danish & coffee OR 2 eggs, cheese, sausage, coffee    Snack (morning)  ensure plus after gym    Lunch  (cafeteria) meat, sometimes with potato    Snack (afternoon)  none OR banana OR chips 4:30    Dinner  Fridays date night OR sausage OR frozen meal broccoli, beef 5:30    Snack (evening)  none    Beverage(s)  water, coffee flavored creamer,       Exercise   Exercise Type  Moderate (swimming / aerobic walking) weight training    How many days per week to you exercise?  4    How many minutes per day do you exercise?  45    Total minutes per week of exercise  180  Patient Education   Previous Diabetes Education  No    Disease state   Definition of diabetes, type 1 and 2, and the diagnosis of diabetes    Nutrition management   Role of diet in the treatment of diabetes and the relationship between the three main macronutrients and blood glucose level;Carbohydrate counting;Food label reading, portion sizes and measuring food.    Physical activity and exercise   Role of exercise on diabetes management, blood pressure control and cardiac health.    Medications  Reviewed patients medication for diabetes, action, purpose, timing of dose and side effects.    Monitoring  Identified appropriate SMBG and/or A1C goals.;Taught/discussed recording of test results and interpretation of SMBG.    Acute complications  Taught treatment of hypoglycemia - the 15 rule.    Psychosocial adjustment  Role of stress on diabetes      Individualized Goals (developed by patient)   Nutrition  General guidelines for healthy choices and portions discussed       Outcomes   Expected Outcomes  Demonstrated interest in learning. Expect positive outcomes    Future DMSE  4-6 wks    Program Status  Not Completed     Individualized Plan for Diabetes Self-Management Training:   Learning Objective:  Patient will have a greater understanding of diabetes self-management. Patient education plan is to attend individual and/or group sessions per assessed needs and concerns.  Patient Instructions  Plan:  Aim for 4 Carb Choices per meal (60 grams) +/- 1 choice either way  Aim for 0-2 Carb Choices per snack if hungry (0-30 grams) Include protein with your meals and snacks Consider having a variety of meals for breakfast such as eggs, yogurt & pecans, cereal & nuts. Aim for 3 balanced meals per day, with not to heavy of a supper meal and not too late Consider reading food labels for Total Carbohydrate and Sat Fat Grams of foods Continue with your activity level daily as tolerated You can check blood sugar at alternate times per day to evaluate your progress Continue taking medication as directed by MD Adequate and restful sleep is important for your health and controlling blood sugar   Expected Outcomes:  Demonstrated interest in learning. Expect positive outcomes  Education material provided: Living Well with Diabetes, A1C conversion sheet, Snack sheet and Carbohydrate counting sheet  If problems or questions, patient to contact team via:  Phone  Future DSME appointment: 4-6 wks

## 2017-05-12 ENCOUNTER — Encounter: Payer: Self-pay | Admitting: Physical Medicine & Rehabilitation

## 2017-05-12 ENCOUNTER — Encounter: Payer: 59 | Attending: Physical Medicine & Rehabilitation

## 2017-05-12 ENCOUNTER — Ambulatory Visit: Payer: 59 | Admitting: Physical Medicine & Rehabilitation

## 2017-05-12 VITALS — BP 153/94 | HR 99

## 2017-05-12 DIAGNOSIS — R269 Unspecified abnormalities of gait and mobility: Secondary | ICD-10-CM | POA: Diagnosis not present

## 2017-05-12 DIAGNOSIS — I69398 Other sequelae of cerebral infarction: Secondary | ICD-10-CM | POA: Diagnosis not present

## 2017-05-12 DIAGNOSIS — I69359 Hemiplegia and hemiparesis following cerebral infarction affecting unspecified side: Secondary | ICD-10-CM | POA: Diagnosis not present

## 2017-05-12 DIAGNOSIS — Z981 Arthrodesis status: Secondary | ICD-10-CM | POA: Diagnosis not present

## 2017-05-12 DIAGNOSIS — I69351 Hemiplegia and hemiparesis following cerebral infarction affecting right dominant side: Secondary | ICD-10-CM | POA: Insufficient documentation

## 2017-05-12 NOTE — Patient Instructions (Signed)
Start stretching hamstring and quads daily

## 2017-05-12 NOTE — Progress Notes (Signed)
Subjective:    Patient ID: Darrell Watson, male    DOB: 1960-08-18, 57 y.o.   MRN: [id number] Presented on July 03, 2016, with right-sided weakness and facial droop with slurred speech. Cranial CT scan negative. 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 MRA negative. The patient did not receive tPA. Echocardiogram with ejection fraction of 50%, no wall motion abnormalities, no emboli identified. Carotid Dopplers, no ICA stenosis. Findings of significant decrease in hemoglobin 15.2-8.3. Recent back surgery. A CT of abdomen and pelvis showed intramuscular hematoma of the left rectus muscle.  DATE OF ADMISSION: 07/05/2016 DATE OF DISCHARGE: 07/16/2016 HPI  Working out approximately 3 days/week at the gym.  Patient states that his right and left arm strength is approximately equal once again he does not have the same endurance in the right arm as prior to his stroke however.  He is having problems with fatigue towards the end of the week.  Working full time, patient is able to do all of his work activities without restriction.  He parts in the handicap lot but this is still a far distance which is difficult for him to maneuver.  He does have the option of riding a golf cart but he does not do this.   Leg weakness, knee pain, does not feel like the right leg is gaining any further strength.  Back pain 2/10, has not seen Dr Shon Baton No radiating pain down the left lower extremity  Pain Inventory Average Pain 2 Pain Right Now 0 My pain is stabbing  In the last 24 hours, has pain interfered with the following? General activity 2 Relation with others 2 Enjoyment of life 2 What TIME of day is your pain at its worst? all Sleep (in general) Fair  Pain is worse with: bending Pain improves with: . Relief from Meds: 2  Mobility walk without assistance  Function employed # of hrs/week  .  Neuro/Psych No problems in this area  Prior Studies Any changes since last visit?  no  Physicians involved in your care Any changes since last visit?  no   No family history on file. Social History   Socioeconomic History  . Marital status: Married    Spouse name: Not on file  . Number of children: Not on file  . Years of education: Not on file  . Highest education level: Not on file  Social Needs  . Financial resource strain: Not on file  . Food insecurity - worry: Not on file  . Food insecurity - inability: Not on file  . Transportation needs - medical: Not on file  . Transportation needs - non-medical: Not on file  Occupational History  . Not on file  Tobacco Use  . Smoking status: Never Smoker  . Smokeless tobacco: Never Used  Substance and Sexual Activity  . Alcohol use: Yes    Comment: BEER  OCC  . Drug use: No  . Sexual activity: Not on file  Other Topics Concern  . Not on file  Social History Narrative  . Not on file   Past Surgical History:  Procedure Laterality Date  . ABDOMINAL EXPOSURE N/A 06/27/2016   Procedure: ABDOMINAL EXPOSURE;  Surgeon: Chuck Hint, MD;  Location: [facility] OR;  Service: Vascular;  Laterality: N/A;  . ANTERIOR LUMBAR FUSION N/A 06/27/2016   Procedure: ANTERIOR LUMBAR FUSION L5-S1;  Surgeon: Venita Lick, MD;  Location: [facility] OR;  Service: Orthopedics;  Laterality: N/A;  Requests 3 hours  . FINGER AMPUTATION Left   . SHOULDER SURGERY     Past Medical History:  Diagnosis Date  . Anemia   . Arthritis   . Stroke (HCC)   . Tachycardia    There were no vitals taken for this visit.  Opioid Risk Score:   Fall Risk Score:  `1  Depression screen PHQ 2/9  Depression screen [facility] 03/17/2017 08/02/2016  Decreased Interest 0 0  Down, Depressed, Hopeless 0 0  PHQ - 2 Score 0 0  Altered sleeping - 0  Tired, decreased energy - 0  Change in appetite - 0  Feeling bad or failure about yourself  - 0  Trouble concentrating - 0   Moving slowly or fidgety/restless - 0  Suicidal thoughts - 0  PHQ-9 Score - 0  Difficult doing work/chores - Not difficult at all     Review of Systems  Constitutional: Negative.   HENT: Negative.   Eyes: Negative.   Respiratory: Negative.   Cardiovascular: Negative.   Gastrointestinal: Negative.   Endocrine: Negative.   Genitourinary: Negative.   Musculoskeletal: Negative.   Skin: Negative.   Allergic/Immunologic: Negative.   Neurological: Negative.   Hematological: Negative.   Psychiatric/Behavioral: Negative.   All other systems reviewed and are negative.      Objective:   Physical Exam Motor strength is 5/5 bilateral deltoid, bicep, tricep, grip, 4/5 bilateral hip flexor 4/5 right knee extensor 5/5 left knee extensor 5/5 bilateral ankle dorsiflexor Decreased range of motion left shoulder with external and internal rotation.  There is also reduced abduction of the shoulder. Gait stiff leg gait no evidence of toe drag or knee instability cocontraction of the quad and hamstring. 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       Assessment & Plan:  1.  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  Upper extremity strength is near baseline. We discussed stretching of the hamstring and quadricep muscles to help improve smoothness of gait.  He does have the option of writing golf cart from his car to the factory.  Follow-up with vascular neurology later this month  2.  History of back pain status post L5-S1 fusion, no issues with this he will likely follow up with Dr. Shon BatonBrooks for 1 year follow-up.

## 2017-05-21 NOTE — Progress Notes (Deleted)
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 He is currently on aspirin for secondary stroke prevention without further stroke or TIA symptoms.He has no bruising and no bleeding. Blood pressure in the office today 136/83. He has not had other stroke risk factors. Cardiac event monitoring was negative for atrial fibrillation.He has had a sleep evaluation and is scheduled for a sleep study. He has stopped his testosterone. He is currently getting some physical therapy he had spinal fusion back in March 2018. He returns for reevaluation  08/20/16 [id number] year old right-handed male with L5-S1 fusion on 06/28/2016, had sudden onset right leg weakness and facial weakness on 07/03/2016. Patient presented to the hospital was admitted for stroke workup. Patient was found to have small acute ischemic infarction in left posterior limb of internal capsule and a punctate right anterior temporal ischemic infarction. No other stroke risk factors were identified.  Patient was found to have significant decline in hemoglobin as well as intramuscular hematoma in the left psoas and left rectus muscles.  Since that time patient has done well. He is continuing to do therapy and plans to transition to water therapy. Patient reports history of interrupted sleep, snoring, daytime fatigue. Patient reports drinking 1-2 alcoholic drinks per month. Patient is continuing on aspirin 81 mg daily.     REVIEW OF SYSTEMS: Full 14 system review of systems performed and notable only for those listed, all others are neg:  Constitutional: neg  Cardiovascular: neg Ear/Nose/Throat: neg  Skin: neg Eyes: neg Respiratory: neg Gastroitestinal: neg  Hematology/Lymphatic: neg    Endocrine: neg Musculoskeletal:neg Allergy/Immunology: neg Neurological: neg Psychiatric: neg Sleep : snoring   ALLERGIES: Allergies  Allergen Reactions  . Metformin And Related Diarrhea    HOME MEDICATIONS: Outpatient Medications Prior to Visit  Medication Sig Dispense Refill  . aspirin EC 81 MG EC tablet Take 1 tablet (81 mg total) by mouth daily.    . metFORMIN (GLUCOPHAGE) 500 MG tablet Take by mouth daily.    Marland Kitchen. testosterone cypionate (DEPOTESTOSTERONE CYPIONATE) 200 MG/ML injection Inject 300 mg into the muscle every 14 (fourteen) days. Gets 1.5 ml at Dr office every 2 weeks Next dose is due 06-24-2016     No facility-administered medications prior to visit.     PAST MEDICAL HISTORY: Past Medical History:  Diagnosis Date  . Anemia   . Arthritis   . Stroke (HCC)   . Tachycardia     PAST SURGICAL HISTORY: Past Surgical History:  Procedure Laterality Date  . ABDOMINAL EXPOSURE N/A 06/27/2016   Procedure: ABDOMINAL EXPOSURE;  Surgeon: Chuck Hinthristopher S Dickson, MD;  [facility];  Service: Vascular;  Laterality: N/A;  . ANTERIOR LUMBAR FUSION N/A 06/27/2016   Procedure: ANTERIOR LUMBAR FUSION L5-S1;  Surgeon: Venita Lickahari Brooks, MD;  Location: [facility] OR;  Service: Orthopedics;  Laterality: N/A;  Requests 3 hours  . FINGER AMPUTATION Left   . SHOULDER SURGERY      FAMILY HISTORY: No family history on file.  SOCIAL HISTORY: Social History   Socioeconomic History  . Marital status: Married    Spouse name: Not on file  . Number of children: Not on file  . Years of education: Not on file  . Highest education level: Not on file  Social Needs  . Financial resource strain: Not on file  .  Food insecurity - worry: Not on file  . Food insecurity - inability: Not on file  . Transportation needs - medical: Not on file  . Transportation needs - non-medical: Not on file  Occupational History  . Not on file  Tobacco Use  . Smoking status: Never Smoker  . Smokeless tobacco:  Never Used  Substance and Sexual Activity  . Alcohol use: Yes    Comment: BEER  OCC  . Drug use: No  . Sexual activity: Not on file  Other Topics Concern  . Not on file  Social History Narrative  . Not on file     PHYSICAL EXAM  There were no vitals filed for this visit. There is no height or weight on file to calculate BMI.  Generalized: Well developed, in no acute distress  Head: normocephalic and atraumatic,. Oropharynx benign  Neck: Supple, no carotid bruits  Cardiac: Regular rate rhythm, no murmur  Musculoskeletal: missing fingers 3 through 5 on left hand   Neurological examination   Mentation: Alert oriented to time, place, history taking. Attention span and concentration appropriate. Recent and remote memory intact.  Follows all commands speech and language fluent.   Cranial nerve II-XII: Fundoscopic exam reveals sharp disc margins.Pupils were equal round reactive to light extraocular movements were full, visual field were full on confrontational test. Facial sensation and strength were normal. hearing was intact to finger rubbing bilaterally. Uvula tongue midline. head turning and shoulder shrug were normal and symmetric.Tongue protrusion into cheek strength was normal. Motor: normal bulk and tone, full strength in the BUE, 4 out of 5 bilaterally. Flexors Sensory: normal and symmetric to light touch, pinprick, and  Vibration, in the upper and lower extremities  Coordination: finger-nose-finger, heel-to-shin bilaterally, no dysmetria Reflexes: Brachioradialis 2/2, biceps 2/2, triceps 2/2, patellar 2/2, Achilles 2/2, plantar responses were flexor bilaterally. Gait and Station: Rising up from seated position without assistance, stiff gait, small steppage Tandem gait is unsteady  DIAGNOSTIC DATA (LABS, IMAGING, TESTING) - I reviewed patient records, labs, notes, testing and imaging myself where available.  Lab Results  Component Value Date   WBC 6.6 07/11/2016   HGB 11.2  (L) 07/11/2016   HCT 35.5 (L) 07/11/2016   MCV 87.0 07/11/2016   PLT 406 (H) 07/11/2016      Component Value Date/Time   NA 137 07/11/2016 0436   NA 137 03/05/2012 1351   K 4.1 07/11/2016 0436   K 4.0 03/05/2012 1351   CL 103 07/11/2016 0436   CL 104 03/05/2012 1351   CO2 28 07/11/2016 0436   CO2 28 03/05/2012 1351   GLUCOSE 89 07/11/2016 0436   GLUCOSE 79 03/05/2012 1351   BUN 10 07/11/2016 0436   BUN 13.0 03/05/2012 1351   CREATININE 1.16 07/12/2016 0620   CREATININE 1.4 (H) 03/05/2012 1351   CALCIUM 8.7 (L) 07/11/2016 0436   CALCIUM 9.6 03/05/2012 1351   PROT 6.4 (L) 07/08/2016 0412   PROT 7.4 03/05/2012 1351   ALBUMIN 3.1 (L) 07/08/2016 0412   ALBUMIN 4.2 03/05/2012 1351   AST 26 07/08/2016 0412   AST 33 03/05/2012 1351   ALT 19 07/08/2016 0412   ALT 37 03/05/2012 1351   ALKPHOS 46 07/08/2016 0412   ALKPHOS 62 03/05/2012 1351   BILITOT 1.7 (H) 07/08/2016 0412   BILITOT 0.47 03/05/2012 1351   GFRNONAA >60 07/12/2016 0620   GFRAA >60 07/12/2016 0620   Lab Results  Component Value Date   CHOL 106 07/03/2016   HDL 45 07/03/2016  LDLCALC 39 07/03/2016   TRIG 111 07/03/2016   CHOLHDL 2.4 07/03/2016   Lab Results  Component Value Date   HGBA1C 6.5 (H) 07/03/2016   Lab Results  Component Value Date   VITAMINB12 547 07/04/2016    ASSESSMENT AND PLAN 57 y.o. year old male here with left posterior limb internal capsule stroke (small vessel thrombosis) and punctate right temporal operculum stroke (cryptogenic). Hedidnot receive IV t-PA due to delay in arrival. No other  stroke risk factors. Patient is scheduled for a sleep study    PLAN: Stressed the importance of management of risk factors to prevent further stroke Continue aspirin for secondary stroke prevention Maintain strict control of hypertension with blood pressure goal below 130/90, today's reading 136/83  Control of diabetes with hemoglobin A1c below 6.5 followed by PCP Cholesterol with LDL  cholesterol less than 70, followed by primary care,  Continue PT then HEP ,Exercise by walking eat healthy diet with whole grains,  fresh fruits and vegetables Follow up with Sleep lab for sleep study Follow up in 6 months I spent  in total face to face time with the patient more than 50% of which was spent counseling and coordination of care, reviewing test results reviewing medications and discussing and reviewing the diagnosis of stroke and managemof risk factors Nilda Riggs, [facility], [facility], APRN  [facility] [address] [place], [place] [address] [phone]

## 2017-05-22 ENCOUNTER — Ambulatory Visit: Payer: 59 | Admitting: Nurse Practitioner

## 2017-05-22 ENCOUNTER — Telehealth: Payer: Self-pay | Admitting: *Deleted

## 2017-05-22 NOTE — Telephone Encounter (Signed)
Patient was no show for FU with NP today. 

## 2017-05-23 ENCOUNTER — Encounter: Payer: Self-pay | Admitting: Nurse Practitioner

## 2017-05-29 ENCOUNTER — Ambulatory Visit: Payer: Self-pay | Admitting: Registered"

## 2017-06-18 ENCOUNTER — Ambulatory Visit: Payer: 59 | Admitting: Internal Medicine

## 2017-06-18 ENCOUNTER — Ambulatory Visit (INDEPENDENT_AMBULATORY_CARE_PROVIDER_SITE_OTHER)
Admission: RE | Admit: 2017-06-18 | Discharge: 2017-06-18 | Disposition: A | Discharge status: Home / Self Care | Payer: 59 | Source: Ambulatory Visit | Attending: Internal Medicine | Admitting: Internal Medicine

## 2017-06-18 ENCOUNTER — Encounter: Payer: Self-pay | Admitting: Internal Medicine

## 2017-06-18 VITALS — BP 122/76 | HR 105 | Ht 70.0 in | Wt 190.8 lb

## 2017-06-18 DIAGNOSIS — D860 Sarcoidosis of lung: Secondary | ICD-10-CM

## 2017-06-18 DIAGNOSIS — R918 Other nonspecific abnormal finding of lung field: Secondary | ICD-10-CM | POA: Diagnosis not present

## 2017-06-18 NOTE — Progress Notes (Signed)
Subjective:     Patient ID: Darrell Watson, male   DOB: Jun 10, 1960,     MRN: [id number]    Brief patient profile: 89 yobm never regular smoker works for [employer] in [employer] with incidental MPN on Abd CT 07/03/16 > full Chest CT 03/17/17 with MPN so referred to pulmonary clinic 03/27/2017 by Dr   Nehemiah Settle    History of Present Illness  03/27/2017 1st [facility] office visit/ Nyle Limb   Chief Complaint  Patient presents with  . Pulmonary Consult    Referred by Dr. Nehemiah Settle for incidental pulmonary nodule. Pt not having any respiratory problems.    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 No unusual exposure hx or h/o childhood pna/ asthma or knowledge of premature birth.  Sleeping ok flat without nocturnal  or early am exacerbation  of respiratory  c/o's or need for noct saba. Also denies any obvious fluctuation of symptoms with weather or environmental changes or other aggravating or alleviating factors except as outlined above   Current Allergies, Complete Past Medical History, Past Surgical History, Family History, and Social History were reviewed in [facility] record.  ROS  The following are not active complaints unless bolded Hoarseness, sore throat, dysphagia, dental problems, itching, sneezing,  nasal congestion or discharge of excess mucus or purulent secretions, ear ache,   fever, chills, sweats, unintended wt loss or wt gain, classically pleuritic or exertional cp,  orthopnea pnd or  leg swelling, presyncope, palpitations, abdominal pain, anorexia, nausea, vomiting, diarrhea  or change in bowel habits or change in bladder habits, change in stools or change in urine, dysuria, hematuria,  rash, arthralgias, visual complaints, headache, numbness, weakness or ataxia or problems with walking or coordination,  change in mood/affect or memory.        Current Meds  Medication Sig  . aspirin EC 81 MG EC tablet Take 1 tablet (81 mg total) by mouth daily.  Marland Kitchen testosterone cypionate (DEPOTESTOSTERONE CYPIONATE) 200 MG/ML injection Inject 300 mg into the muscle every 14 (fourteen) days. Gets 1.5 ml at Dr office every 2 weeks Next dose is due 06-24-2016                   Objective:   Physical Exam  amb bm nad    06/18/2017      190   03/27/17 197 lb (89.4 kg)  11/19/16 195 lb (88.5 kg)  11/04/16 194 lb (88 kg)    Vital signs reviewed - Note on arrival 02 sats  97% on RA    Min ax nodes L > R   HEENT: nl dentition, turbinates bilaterally, and oropharynx. Nl external ear canals without cough reflex   NECK :  without JVD/Nodes/TM/ nl carotid upstrokes bilaterally   LUNGS: no acc muscle use,  Nl contour chest which is clear to A and P bilaterally without cough on insp or exp maneuvers/ min ax nodes L >  R    CV:  RRR  no s3 or murmur or increase in P2, and no edema   ABD:  soft and nontender with nl inspiratory excursion in the supine position. No bruits or organomegaly appreciated, bowel sounds nl  MS:  Nl gait/ ext warm without deformities, calf tenderness, cyanosis or clubbing Limited abduction both shoulders    SKIN: warm and dry without lesions   - no ext rash   NEURO:  alert, approp, nl sensorium with  no motor or cerebellar deficits apparent.          CXR PA and Lateral:   06/18/2017 :    I personally reviewed images and impression as follows:   Vague donut sign on lateral view is typical of sarcoid - no def ILD         Assessment:

## 2017-06-18 NOTE — Patient Instructions (Signed)
Please remember to go to the  x-ray department downstairs in the basement  for your tests - we will call you with the results when they are available.      Please schedule a follow up visit in 6 months but call sooner if needed with cxr on return  

## 2017-06-18 NOTE — Assessment & Plan Note (Signed)
CT chest 03/17/17 Widespread nodularity throughout both lungs, predominantly in a perilymphatic distribution. There is some associated borderline enlarged mediastinal lymphadenopathy, and several borderline enlarged and mildly enlarged left axillary lymph nodes. 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   Discussed in detail all the  indications, usual  risks and alternatives  relative to the benefits with patient who agrees to proceed with conservative f/u with cxr in 6 m then yearly, sooner if new symptoms  I had an extended discussion with the patient reviewing all relevant studies completed to date and  lasting 15 to 20 minutes of a 25 minute visit    Each maintenance medication was reviewed in detail including most importantly the difference between maintenance and prns and under what circumstances the prns are to be triggered using an action plan format that is not reflected in the computer generated alphabetically organized AVS.    Please see AVS for specific instructions unique to this visit that I personally wrote and verbalized to the the pt in detail and then reviewed with pt  by my nurse highlighting any  changes in therapy recommended at today's visit to their plan of care.

## 2017-06-19 ENCOUNTER — Telehealth: Payer: Self-pay | Admitting: Internal Medicine

## 2017-06-19 NOTE — Progress Notes (Signed)
LMTCB

## 2017-06-19 NOTE — Telephone Encounter (Signed)
Notes recorded by Nyoka CowdenWert, Michael B, MD on 06/19/2017 at 5:03 AM EST Call pt: Reviewed cxr and no acute change so no change in recommendations made at ov  Pt is aware of results and voiced his understanding. Nothing further is needed.

## 2017-09-09 DIAGNOSIS — E119 Type 2 diabetes mellitus without complications: Secondary | ICD-10-CM | POA: Diagnosis not present

## 2017-09-09 DIAGNOSIS — I10 Essential (primary) hypertension: Secondary | ICD-10-CM | POA: Diagnosis not present

## 2017-09-09 DIAGNOSIS — D860 Sarcoidosis of lung: Secondary | ICD-10-CM | POA: Diagnosis not present

## 2017-09-09 DIAGNOSIS — I6340 Cerebral infarction due to embolism of unspecified cerebral artery: Secondary | ICD-10-CM | POA: Diagnosis not present

## 2017-09-11 DIAGNOSIS — E1169 Type 2 diabetes mellitus with other specified complication: Secondary | ICD-10-CM | POA: Diagnosis not present

## 2017-09-11 DIAGNOSIS — E1165 Type 2 diabetes mellitus with hyperglycemia: Secondary | ICD-10-CM | POA: Diagnosis not present

## 2017-11-18 ENCOUNTER — Other Ambulatory Visit: Payer: Self-pay

## 2017-11-18 ENCOUNTER — Encounter: Payer: Self-pay | Admitting: Physical Medicine & Rehabilitation

## 2017-11-18 ENCOUNTER — Ambulatory Visit: Payer: 59 | Admitting: Physical Medicine & Rehabilitation

## 2017-11-18 ENCOUNTER — Encounter: Payer: 59 | Attending: Physical Medicine & Rehabilitation

## 2017-11-18 VITALS — BP 101/62 | HR 102 | Ht 70.0 in | Wt 178.8 lb

## 2017-11-18 DIAGNOSIS — I69359 Hemiplegia and hemiparesis following cerebral infarction affecting unspecified side: Secondary | ICD-10-CM

## 2017-11-18 DIAGNOSIS — I69398 Other sequelae of cerebral infarction: Secondary | ICD-10-CM | POA: Diagnosis not present

## 2017-11-18 DIAGNOSIS — R2689 Other abnormalities of gait and mobility: Secondary | ICD-10-CM | POA: Insufficient documentation

## 2017-11-18 DIAGNOSIS — R5383 Other fatigue: Secondary | ICD-10-CM | POA: Diagnosis not present

## 2017-11-18 DIAGNOSIS — Z981 Arthrodesis status: Secondary | ICD-10-CM | POA: Diagnosis not present

## 2017-11-18 DIAGNOSIS — M1990 Unspecified osteoarthritis, unspecified site: Secondary | ICD-10-CM | POA: Diagnosis not present

## 2017-11-18 NOTE — Progress Notes (Signed)
Subjective:    Patient ID: Darrell Watson, male    DOB: [birth year], 57 y.o.   MRN: [id number] Presented on July 03, 2016, with right-sided weakness and facial droop with slurred speech. Cranial CT scan negative. 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 MRA negative. The patient did not receive tPA. Echocardiogram with ejection fraction of 50%, no wall motion abnormalities, no emboli identified. Carotid Dopplers, no ICA stenosis. Findings of significant decrease in hemoglobin 15.2-8.3. Recent back surgery. A CT of abdomen and pelvis showed intramuscular hematoma of the left rectus muscle.  DATE OF ADMISSION: 07/05/2016 DATE OF DISCHARGE: 07/16/2016  HPI C/o problems keeping up at work, pt feels like he needs to take more breaks No new injuries.  Legs feel weak No falls Not working out as much.  Was going 3 times per week in January now going a couple times a month.  He states that he just does not have the energy. No new medical issues but is seeing pulmonary now. 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 General activity 2 Relation with others 2 Enjoyment of life 2 What TIME of day is your pain at its worst? daytime Sleep (in general) Poor  Pain is worse with: walking, sitting and standing Pain improves with: none Relief from Meds: no meds  Mobility ability to climb steps?  yes do you drive?  yes  Function employed # of hrs/week n/a  Neuro/Psych weakness tremor trouble walking  Prior Studies Any changes since last visit?  no  Physicians involved in your care Any changes since last visit?  no   No family history on file. Social History   Socioeconomic History  . Marital status: Married    Spouse name: Not on file  . Number of children: Not on file  . Years of education: Not on file  . Highest education level: Not on file  Occupational History  . Not on file  Social Needs  . Financial resource strain: Not on file  . Food insecurity:    Worry: Not on file    Inability: Not on file  . Transportation needs:    Medical: Not on file    Non-medical: Not on file  Tobacco Use  . Smoking status: Never Smoker  . Smokeless tobacco: Never Used  Substance and Sexual Activity  . Alcohol use: Yes    Comment: BEER  OCC  . Drug use: No  . Sexual activity: Not on file  Lifestyle  . Physical activity:    Days per week: Not on file    Minutes per session: Not on file  . Stress: Not on file  Relationships  . Social connections:    Talks on phone: Not on file    Gets together: Not on file    Attends religious service: Not on file    Active member of club or organization: Not on file    Attends meetings of clubs or organizations: Not on file    Relationship status: Not on file  Other Topics Concern  . Not on file  Social History Narrative  . Not on file   Past Surgical History:  Procedure Laterality Date  . ABDOMINAL EXPOSURE N/A 06/27/2016   Procedure: ABDOMINAL EXPOSURE;  Surgeon: Chuck Hint, MD;  Location: [facility] OR;  Service: Vascular;  Laterality: N/A;  . ANTERIOR LUMBAR FUSION N/A 06/27/2016   Procedure: ANTERIOR LUMBAR FUSION L5-S1;  Surgeon: Venita Lick, MD;  Location: [facility] OR;  Service: Orthopedics;  Laterality: N/A;  Requests 3 hours  . FINGER AMPUTATION Left   . SHOULDER SURGERY     Past Medical History:  Diagnosis Date  . Anemia   . Arthritis   . Stroke (HCC)   . Tachycardia    BP 101/62   Pulse (!) 102   Ht 5\' 10"  (1.778 m)   Wt 178 lb 12.8 oz (81.1 kg)   SpO2 98%   BMI 25.66 kg/m   Opioid Risk Score:   Fall Risk Score:  `1  Depression screen PHQ 2/9  Depression screen [facility] 2/9 11/18/2017 03/17/2017 08/02/2016  Decreased Interest 0 0 0    Down, Depressed, Hopeless 0 0 0  PHQ - 2 Score 0 0 0  Altered sleeping - - 0  Tired, decreased energy - - 0  Change in appetite - - 0  Feeling bad or failure about yourself  - - 0  Trouble concentrating - - 0  Moving slowly or fidgety/restless - - 0  Suicidal thoughts - - 0  PHQ-9 Score - - 0  Difficult doing work/chores - - Not difficult at all    Review of Systems  Constitutional: Positive for unexpected weight change.  HENT: Negative.   Eyes: Negative.   Respiratory: Negative.   Cardiovascular: Negative.   Gastrointestinal: Negative.   Endocrine: Negative.   Genitourinary: Negative.   Musculoskeletal: Negative.   Skin: Negative.   Allergic/Immunologic: Negative.   Neurological: Negative.   Hematological: Negative.   Psychiatric/Behavioral: Negative.   All other systems reviewed and are negative.      Objective:   Physical Exam  Constitutional: He appears well-developed and well-nourished.  HENT:  Head: Normocephalic and atraumatic.  Eyes: Pupils are equal, round, and reactive to light. EOM are normal.  Neck: Normal range of motion. Neck supple.  Nursing note and vitals reviewed.  Motor 5/5 in BUE and BLE Sensation normal in UE and LE Ambulates with a stiff leg gait on the right side.  No toe drag. His lumbar range of motion is 50% flexion extension lateral bending and rotation. Speech is without dysarthria or a aphasia.  Mood and affect are appropriate #1.  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      Assessment & Plan:  He has a stiff leg gait.  He also is experiencing post stroke fatigue.  We discussed that he does not have any new neurologic deficits since last visit in January.  I do not think any additional imaging is needed, patient is in agreement. He will explore his options at work.  We discussed reducing his work hours if this is possible.  We also discussed riding a golf cart to and from his car to  help conserve energy.  We also discussed doing some light to moderate aerobic exercise rather than weight lifting at the gym. He will see me back on as-needed basis. As we discussed this is probably a new normal for him.

## 2017-12-16 ENCOUNTER — Ambulatory Visit (INDEPENDENT_AMBULATORY_CARE_PROVIDER_SITE_OTHER)
Admission: RE | Admit: 2017-12-16 | Discharge: 2017-12-16 | Disposition: A | Discharge status: Home / Self Care | Payer: 59 | Source: Ambulatory Visit | Attending: Internal Medicine | Admitting: Internal Medicine

## 2017-12-16 ENCOUNTER — Encounter: Payer: Self-pay | Admitting: Internal Medicine

## 2017-12-16 ENCOUNTER — Ambulatory Visit: Payer: 59 | Admitting: Internal Medicine

## 2017-12-16 VITALS — BP 128/70 | HR 115 | Ht 70.0 in | Wt 179.0 lb

## 2017-12-16 DIAGNOSIS — D860 Sarcoidosis of lung: Secondary | ICD-10-CM

## 2017-12-16 NOTE — Progress Notes (Signed)
Subjective:  Patient ID: Darrell Watson, male   DOB: July 27, 1960      MRN: [id number]     Brief patient profile: [age] yobm never regular smoker works for [employer] [place] [employer] [place] with incidental MPN on Abd CT 07/03/16 > full Chest CT 03/17/17 with MPN so referred to pulmonary clinic 03/27/2017 by Dr   Darrell Watson    History of Present Illness  03/27/2017 1st [facility] office visit/ Darrell Watson   Chief Complaint  Patient presents with  . Pulmonary Consult    Referred by Dr. Nehemiah Watson for incidental pulmonary nodule. Pt not having any respiratory problems.    never told he had sarcoid, has min djd arthritis not suggestive of  CTD, Ca, no unusual exp hx and no symptoms at all except for new eye drainage issues on R s viz changes and neg w/u by opth rec Serial f/u s pred      12/16/2017  f/u ov/Darrell Watson re: f/u sarcoid / still not on prednisone / never bx'd  Chief Complaint  Patient presents with  . Follow-up    CXR today, no symptoms other then weight loss.  Dyspnea:  Not limited by breathing from desired activities  But no energy/ still some R leg weakness p cva Cough: none Sleeping: flat/one pillow insomnia SABA use: none 02: none     No obvious day to day or daytime variability or assoc excess/ purulent sputum or mucus plugs or hemoptysis or cp or chest tightness, subjective wheeze or overt sinus or hb symptoms.    Also denies any obvious fluctuation of symptoms with weather or environmental changes or other aggravating or alleviating factors except as outlined above   No unusual exposure hx or h/o childhood pna/ asthma or knowledge of premature birth.  Current Allergies, Complete Past Medical History, Past Surgical History, Family History, and Social History were reviewed in [facility] electronic medical record.  ROS  The following are not active complaints unless bolded Hoarseness, sore throat, dysphagia, dental problems, itching, sneezing,  nasal congestion or discharge of excess mucus or  purulent secretions, ear ache,   fever, chills, sweats, unintended wt loss or wt gain, classically pleuritic or exertional cp,  orthopnea pnd or arm/hand swelling  or leg swelling, presyncope, palpitations, abdominal pain, anorexia, nausea, vomiting, diarrhea  or change in bowel habits or change in bladder habits, change in stools or change in urine, dysuria, hematuria,  rash, arthralgias, visual complaints, headache, numbness, weakness or ataxia or problems with walking or coordination,  change in mood or  memory.        Current Meds  Medication Sig  . aspirin EC 81 MG EC tablet Take 1 tablet (81 mg total) by mouth daily.  . irbesartan-hydrochlorothiazide (AVALIDE) 150-12.5 MG tablet TK 1 T PO QD  . metFORMIN (GLUCOPHAGE-XR) 500 MG 24 hr tablet TK 1 T PO BID WITH FOOD                Objective:   Physical Exam  amb bm nad    12/16/2017       179   06/18/2017      190   03/27/17 197 lb (89.4 kg)  11/19/16 195 lb (88.5 kg)  11/04/16 194 lb (88 kg)      Vital signs reviewed - Note on arrival 02 sats  100% on Ra    HEENT: nl dentition, turbinates bilaterally, and oropharynx. Nl external ear canals without cough reflex   NECK :  without JVD/Nodes/TM/ nl carotid  upstrokes bilaterally   LUNGS: no acc muscle use,  Nl contour chest which is clear to A and P bilaterally without cough on insp or exp maneuvers   CV:  RRR  no s3 or murmur or increase in P2, and no edema   ABD:  soft and nontender with nl inspiratory excursion in the supine position. No bruits or organomegaly appreciated, bowel sounds nl  MS:  Nl gait/ ext warm without deformities, calf tenderness, cyanosis or clubbing No obvious joint restrictions   SKIN: warm and dry without lesions    NEURO:  alert, approp, nl sensorium with  no  cerebellar deficits apparent/ some R LE paresis         CXR PA and Lateral:   12/16/2017 :    I personally reviewed images and agree with radiology impression as follows:   Stable  upper lobe nodularity and hilar prominence from underlying adenopathy as seen on prior CT of chest. No acute pulmonary process.       Assessment:

## 2017-12-16 NOTE — Patient Instructions (Signed)
Please schedule a follow up visit in 6  months but call sooner if needed with cxr  

## 2017-12-18 ENCOUNTER — Encounter: Payer: Self-pay | Admitting: Internal Medicine

## 2017-12-18 NOTE — Assessment & Plan Note (Signed)
CT chest 03/17/17 Widespread nodularity throughout both lungs, predominantly in a perilymphatic distribution. There is some associated borderline enlarged mediastinal lymphadenopathy, and several borderline enlarged and mildly enlarged left axillary lymph nodes. - 03/27/2017  ESR 13,  Angiotensin = 34  I cannot detect any adenopathy at all on exam or identify any symptoms to suggest active sarcoid at this point nor any evidence of an alternative dx so do not rec tissue dx and note neg opth eval also so no need for fob/bx or rx at this point  Natural hx of asymptomatic sarcoid reviewed with pts including indications for bx  Discussed in detail all the  indications, usual  risks and alternatives  relative to the benefits with patient who agrees to proceed with conservative f/u as outlined     F/u can be q 6 m, sooner prn   > 50% ov 23 min ov spent in counseling

## 2018-03-31 DIAGNOSIS — Z0000 Encounter for general adult medical examination without abnormal findings: Secondary | ICD-10-CM | POA: Diagnosis not present

## 2018-03-31 DIAGNOSIS — I6340 Cerebral infarction due to embolism of unspecified cerebral artery: Secondary | ICD-10-CM | POA: Diagnosis not present

## 2018-03-31 DIAGNOSIS — I10 Essential (primary) hypertension: Secondary | ICD-10-CM | POA: Diagnosis not present

## 2018-03-31 DIAGNOSIS — E291 Testicular hypofunction: Secondary | ICD-10-CM | POA: Diagnosis not present

## 2018-03-31 DIAGNOSIS — D860 Sarcoidosis of lung: Secondary | ICD-10-CM | POA: Diagnosis not present

## 2018-03-31 DIAGNOSIS — E1169 Type 2 diabetes mellitus with other specified complication: Secondary | ICD-10-CM | POA: Diagnosis not present

## 2018-06-04 IMAGING — CR DG OR LOCAL ABDOMEN
2 series · 2 of 2 positions shown · non-contrast
Comparison: 10/13/2015 MRI lumbar spine. Intraoperative
fluoroscopic radiographs from earlier today.

ADDENDUM:
These results were called by telephone at the time of interpretation
on 06/27/2016 at [DATE] to Dr. NIVIRUS DATABEX in the OR, who verbally
acknowledged these results.

No unexpected radiopaque foreign bodies in the visualized abdomen or
pelvis. Original report is otherwise unchanged.
CLINICAL DATA: Low back pain with leg pain.  ALIF.
EXAM:
OR LOCAL ABDOMEN

[AP (1 of 2)]
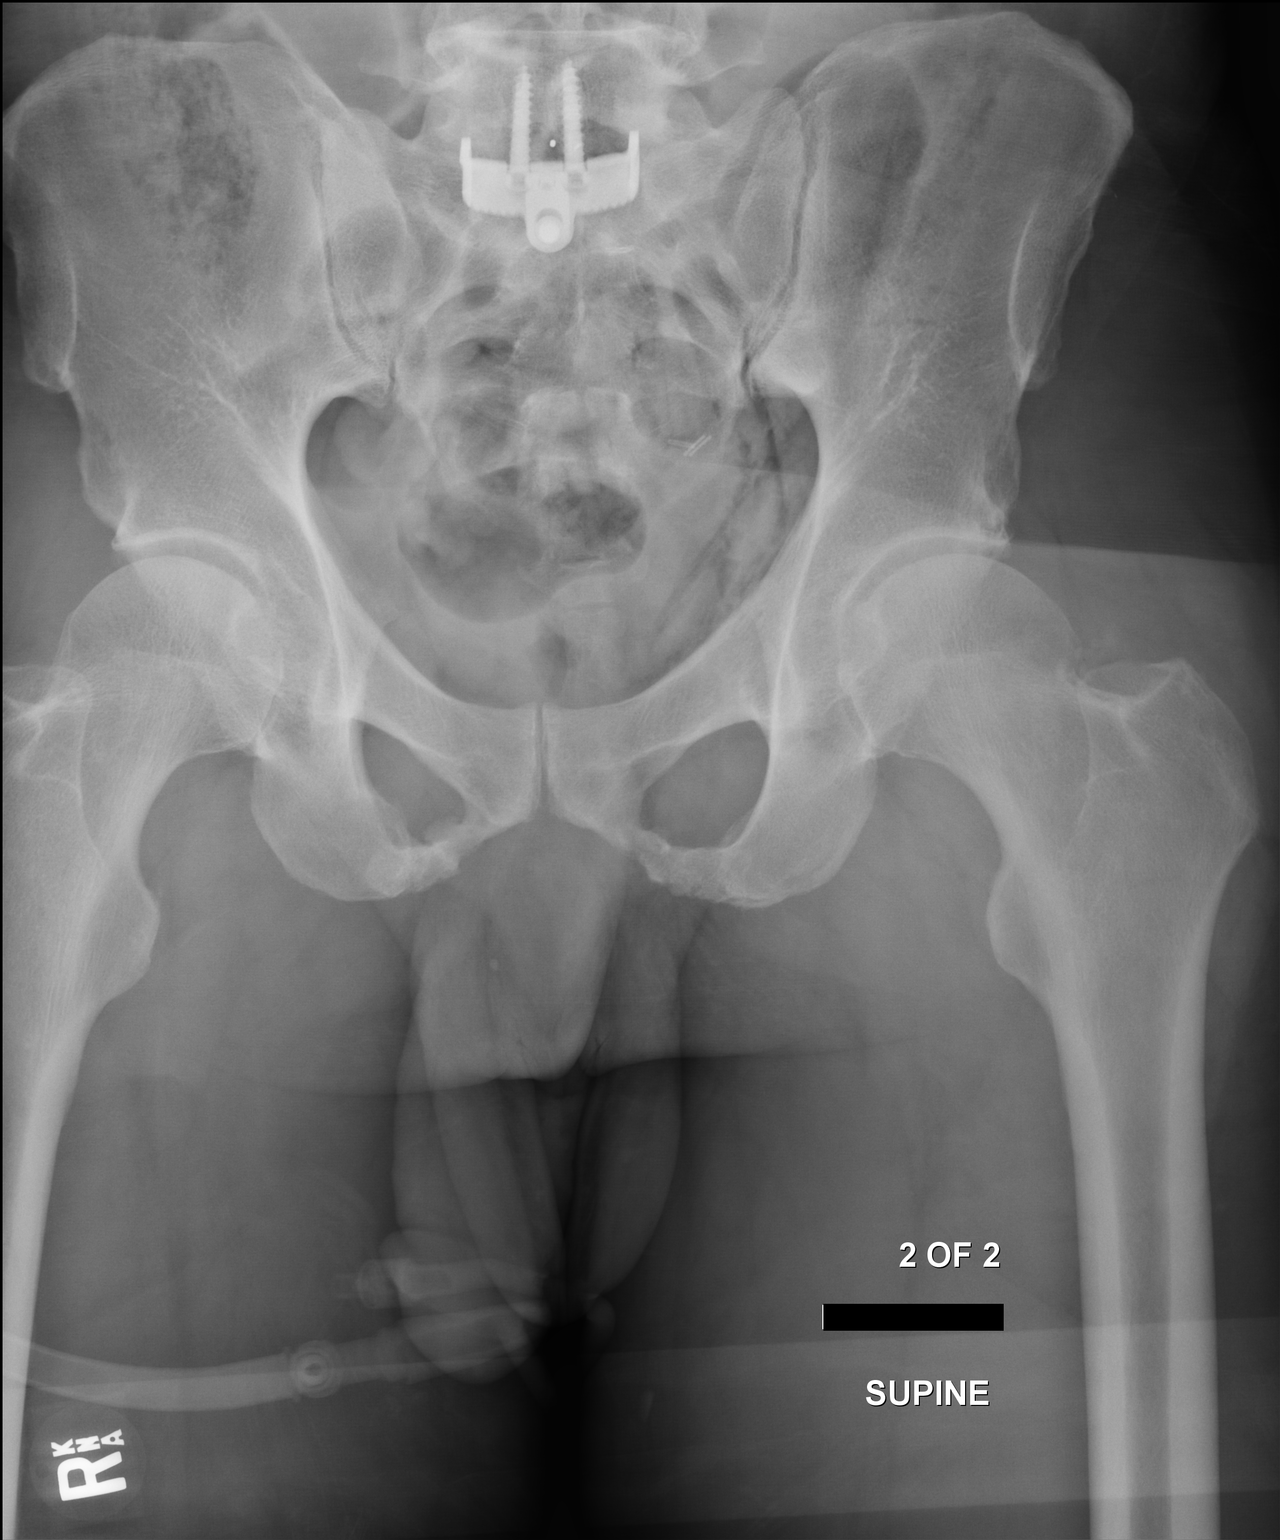

[AP (2 of 2)]
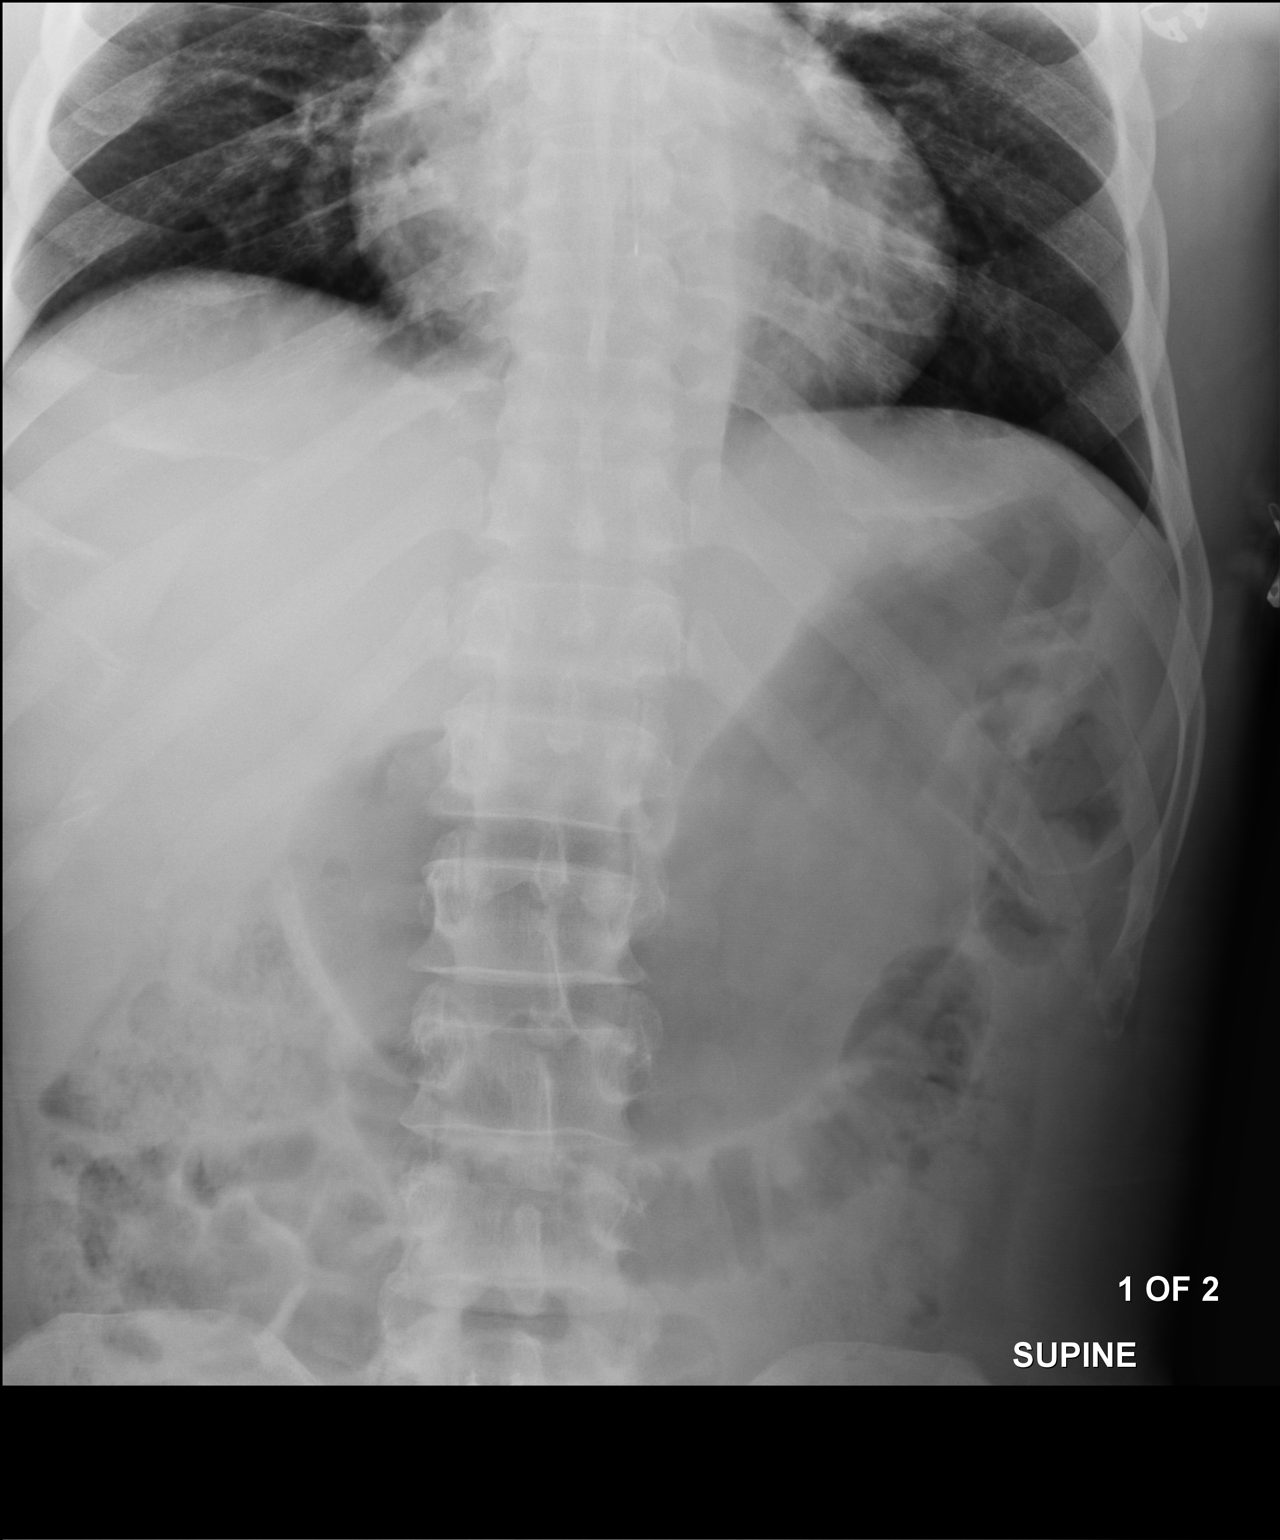

[2 of 2 positions shown; findings below may reference images not displayed]

FINDINGS: Portable intraoperative supine radiographs of the abdomen and pelvis
demonstrate expected postsurgical changes from anterior spinal
fusion at L5-S1 with bone cage overlying the L5-S1 disc space and no
evidence of hardware fracture or loosening on these views. Surgical
clips overlie the left deep pelvis. Nonobstructive bowel gas
pattern. No evidence of pneumatosis or pneumoperitoneum. Healed
deformities in the bilateral lower pubic rami. Clear lung bases.
Esophageal temperature probe overlies the lower thoracic esophagus.
IMPRESSION: Expected postsurgical changes from anterior spinal fusion at L5-S1.

## 2018-06-04 IMAGING — RF DG C-ARM 61-120 MIN
1 series · 7 of 7 positions shown · non-contrast
Comparison: Lumbar MRI 10/13/2015

CLINICAL DATA: ALIF

EXAM:
DG C-ARM 61-120 MIN; LUMBAR SPINE - 2-3 VIEW

[Series 1: run · 7 of 7 slices shown]
[im 1/7]
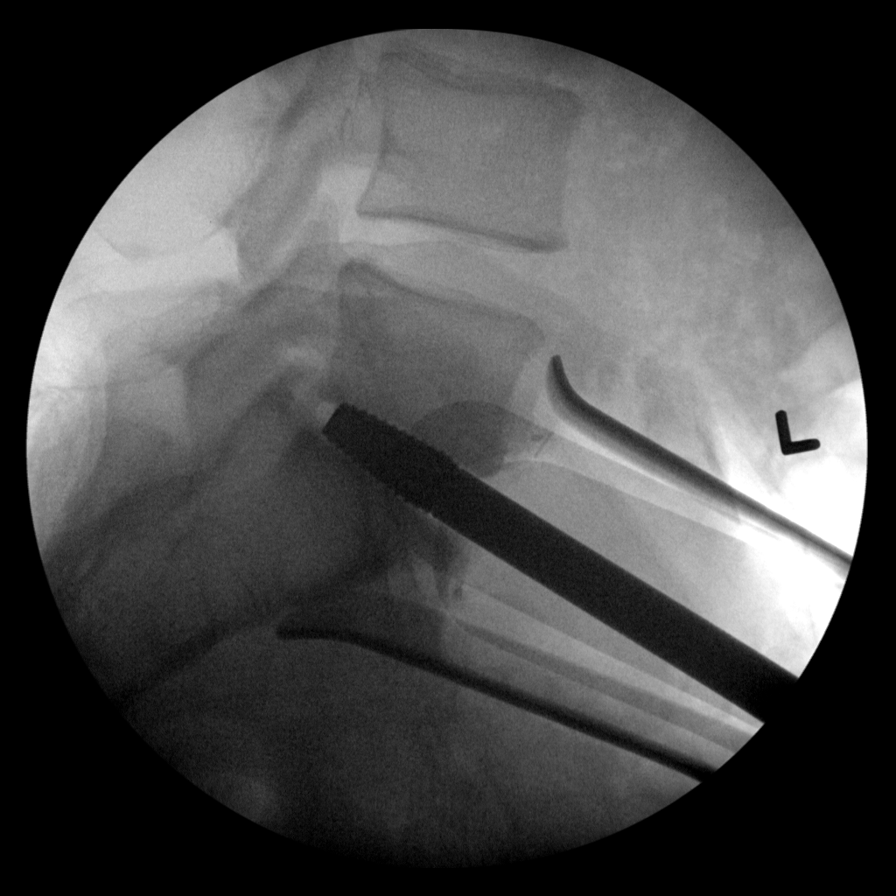
[im 2/7]
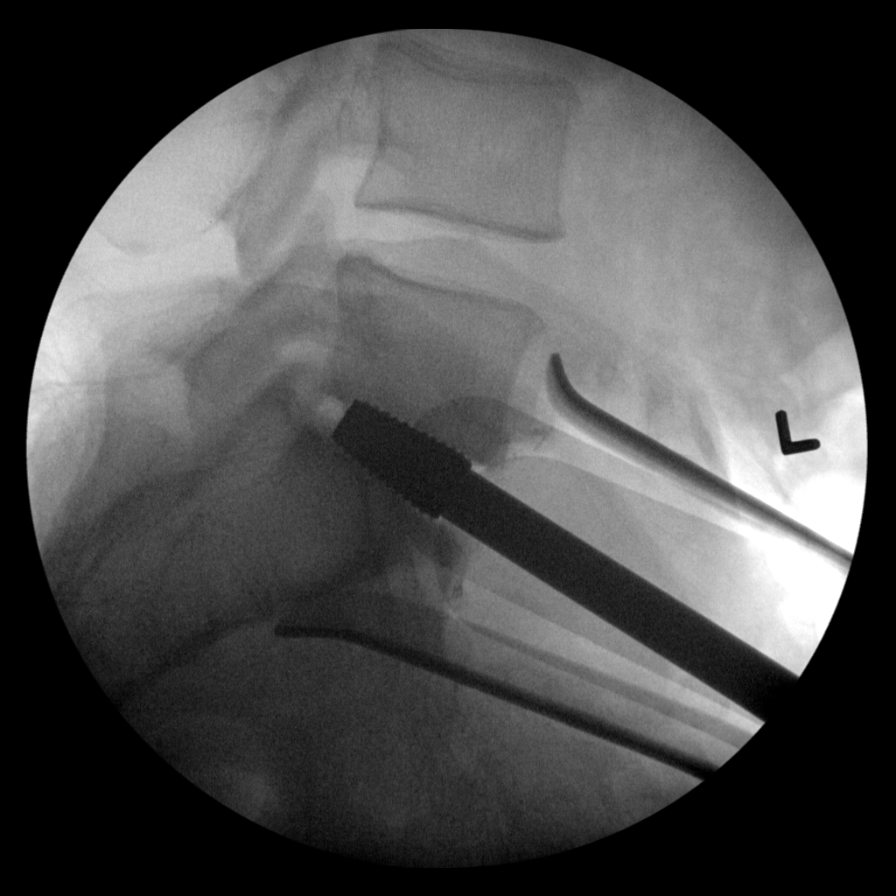
[im 3/7]
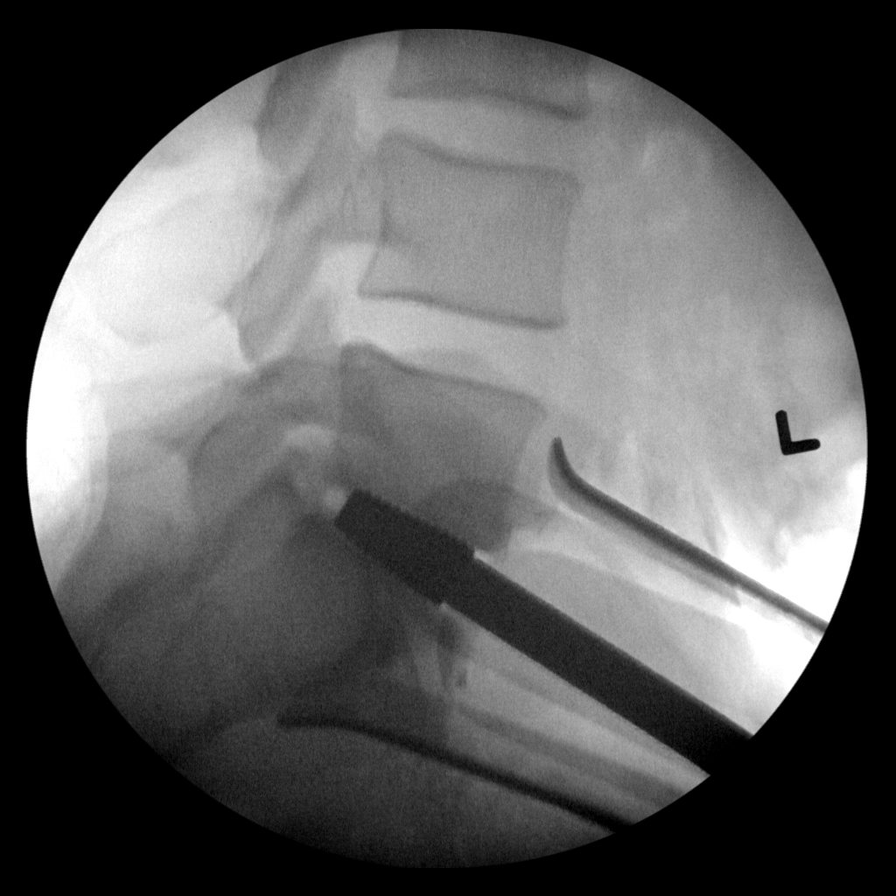
[im 4/7]
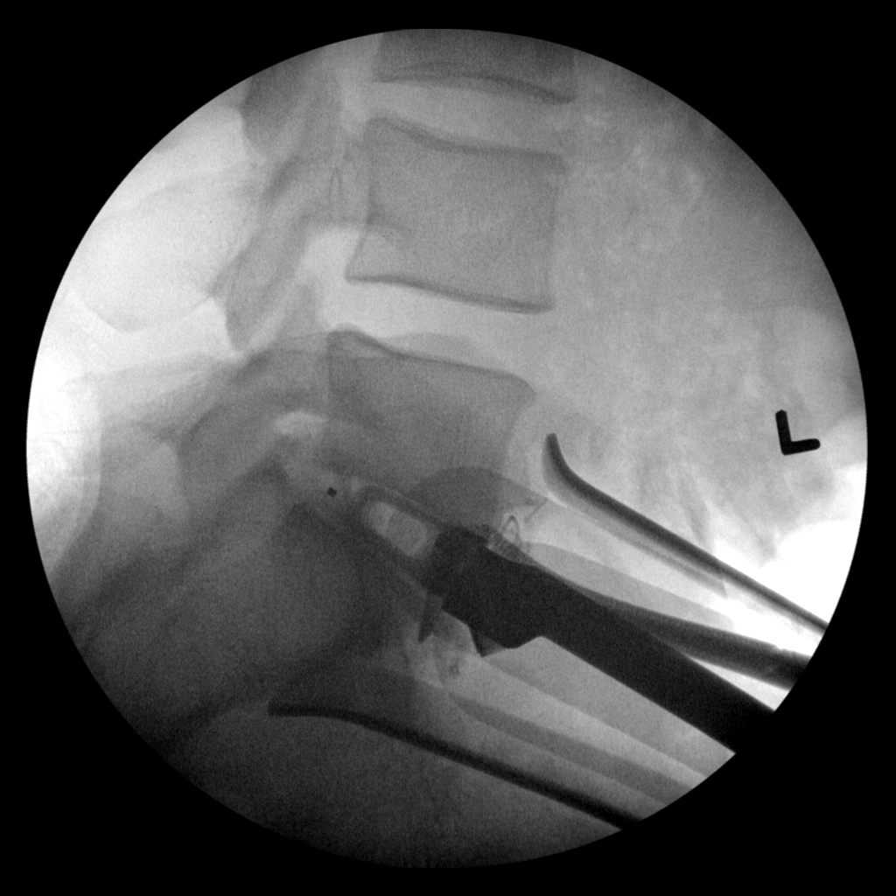
[im 5/7]
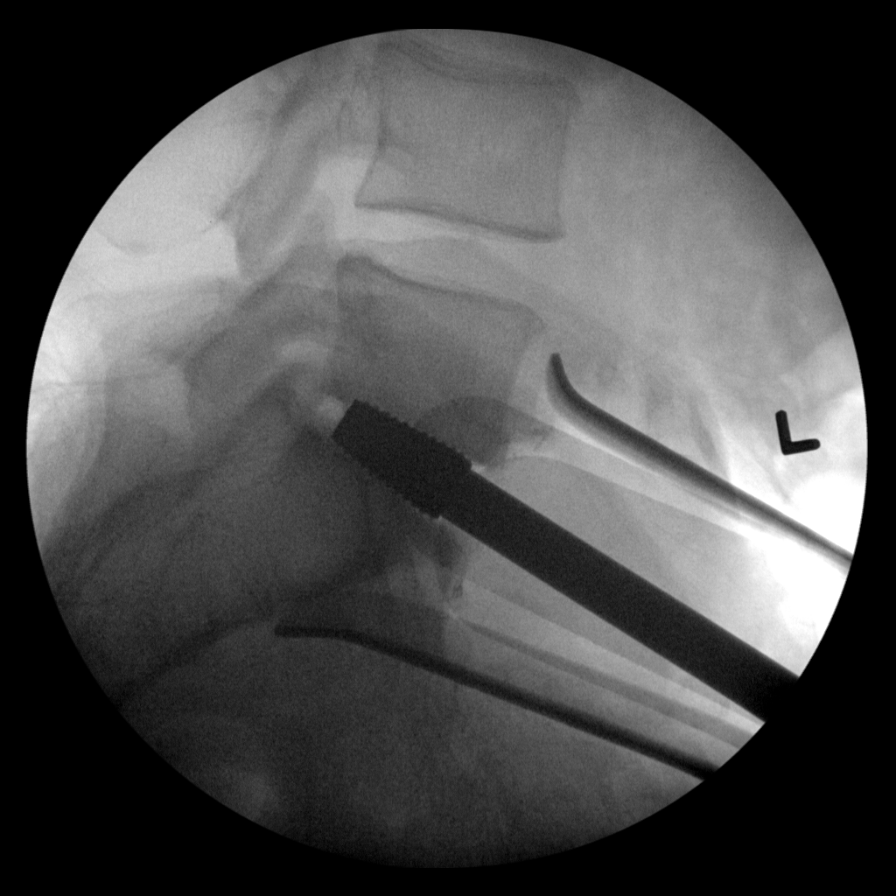
[im 6/7]
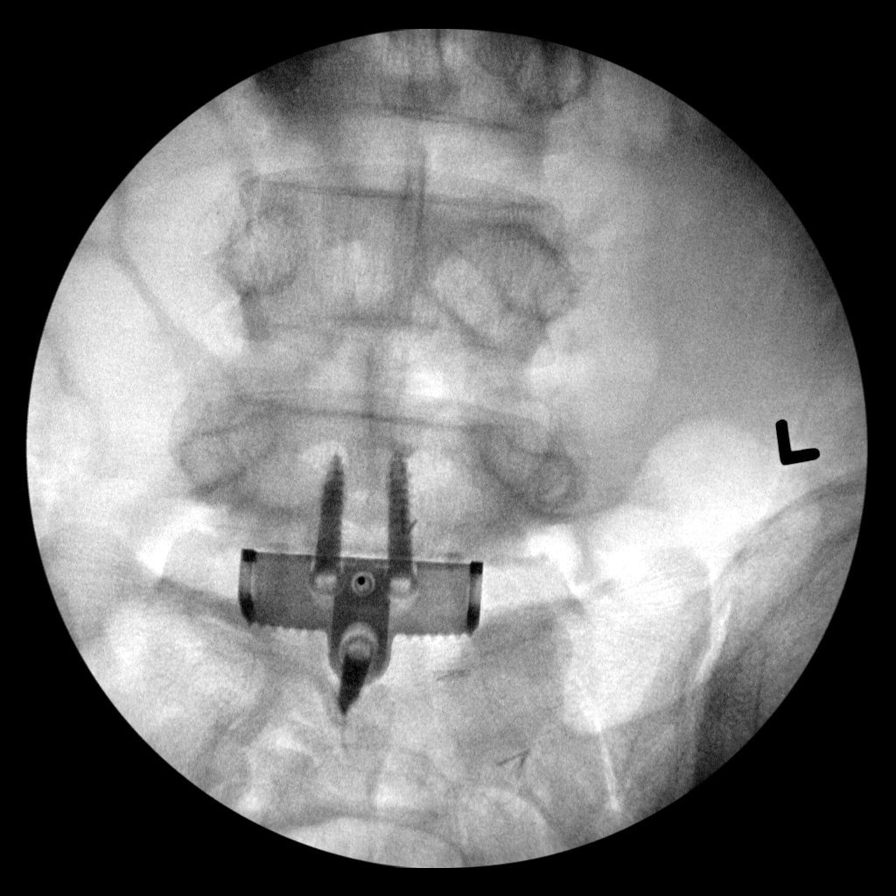
[im 7/7]
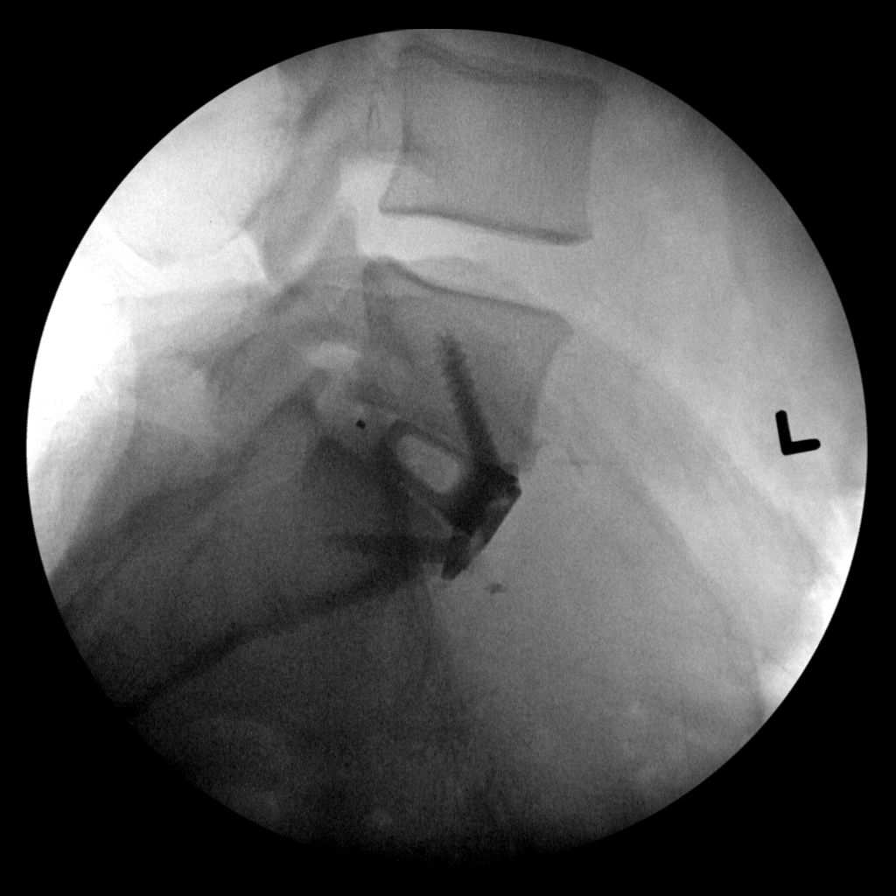

[7 of 7 positions shown; findings below may reference images not displayed]

FINDINGS: AP and lateral C-arm images were obtained in the operating room.
Images showed satisfactory placement of interbody spacer at L5-S1.
Final image shows interbody spacer and anterior plate and screws in
satisfactory position L5-S1.
IMPRESSION: L5-S1 ALIF

## 2018-06-10 IMAGING — MR MR MRA HEAD W/O CM
9 of 11 series · 33 of 48 positions shown · non-contrast
Comparison: None.

07/03/2016 CT of the head.

CLINICAL DATA: 55 y/o M; right leg weakness, facial droop, and
slurred speech.

EXAM:
MRI HEAD WITHOUT CONTRAST
MRA HEAD WITHOUT CONTRAST
TECHNIQUE: Multiplanar, multiecho pulse sequences of the brain and surrounding
structures were obtained without intravenous contrast. Angiographic
images of the head were obtained using MRA technique without
contrast.

[Series 3: T1 · sagittal · 5.0mm · 0.47mm/px · 3 of 23 slices shown]
[im 1/23]
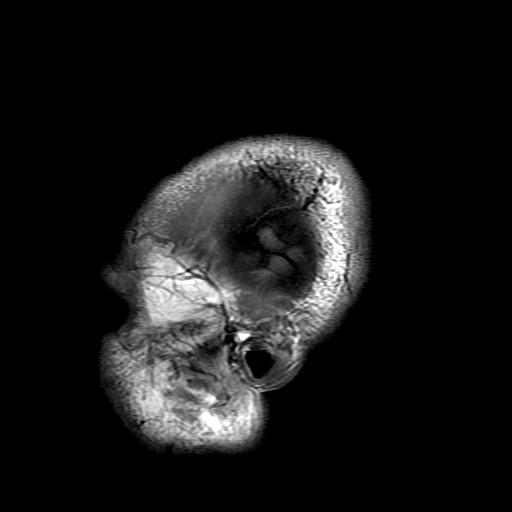
[im 12/23]
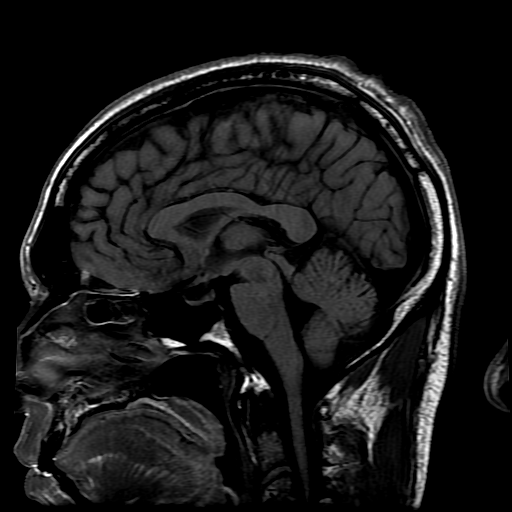
[im 23/23]
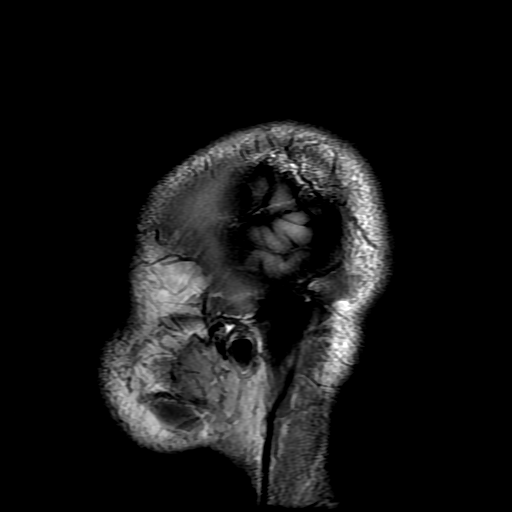

[Series 4: DWI · axial · 3.0mm · 1.09mm/px · z∈[-97,+35]mm · 8 of 90 slices shown (1 of 4)]
[im 1/90]
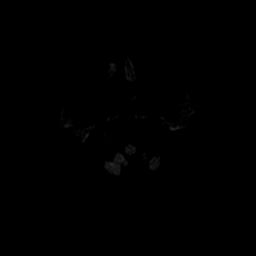
[im 13/90]
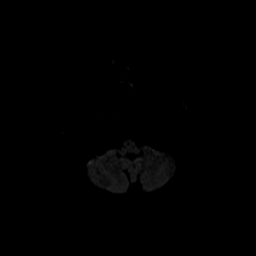
[im 26/90]
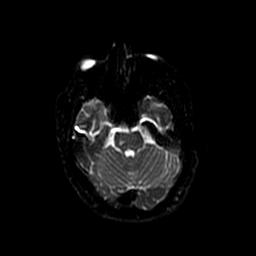
[im 39/90]
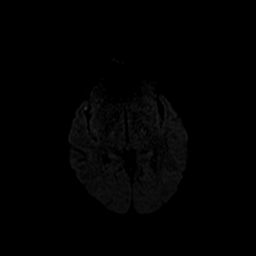
[im 51/90]
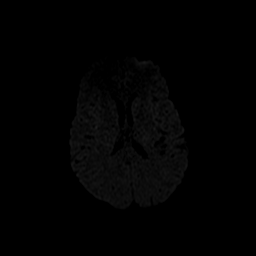
[im 64/90]
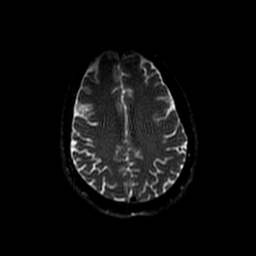
[im 77/90]
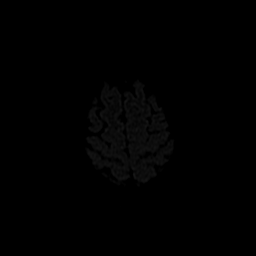
[im 90/90]
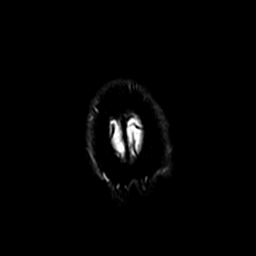

[Series 6: T2 · axial · 5.0mm · 0.43mm/px · z∈[-91,+46]mm · 2 of 24 slices shown (1 of 2)]
[im 1/24]
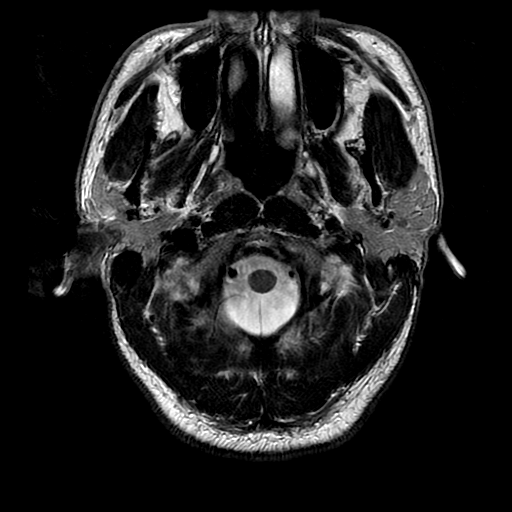
[im 24/24]
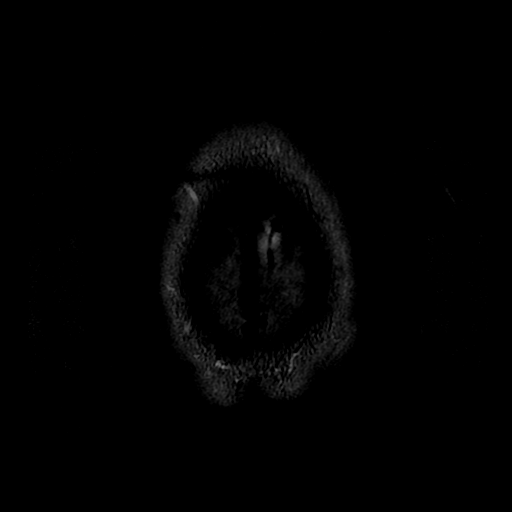

[Series 7: DWI · coronal · 5.0mm · 1.09mm/px · 6 of 66 slices shown (2 of 4)]
[im 1/66]
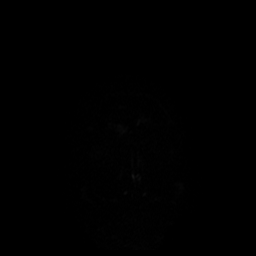
[im 14/66]
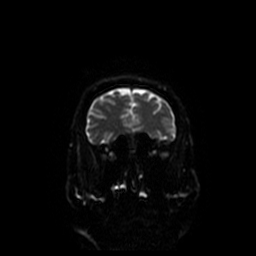
[im 27/66]
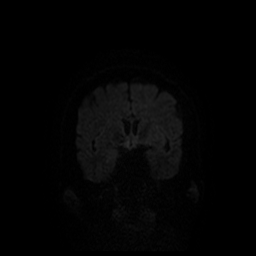
[im 40/66]
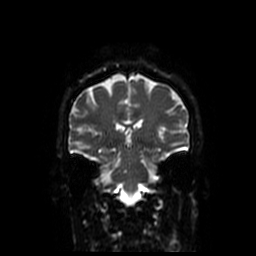
[im 53/66]
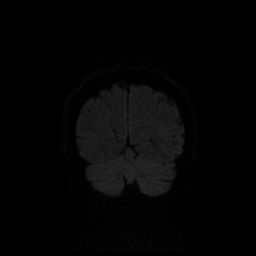
[im 66/66]
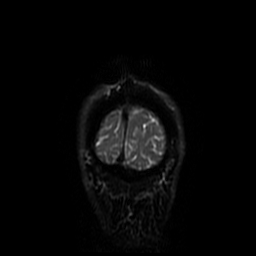

[Series 9: FLAIR · axial · 5.0mm · 0.43mm/px · z∈[-96,+48]mm · 2 of 25 slices shown (1 of 2)]
[im 1/25]
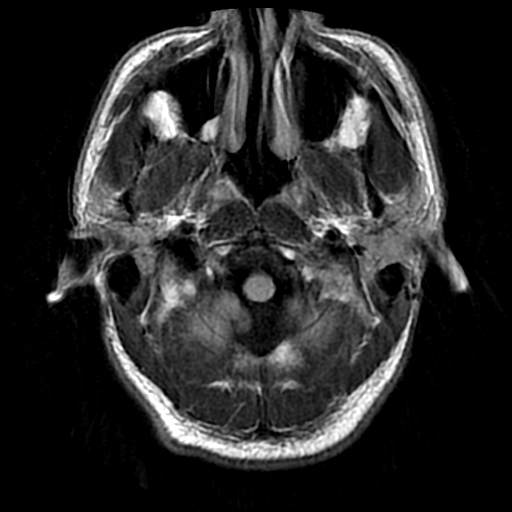
[im 25/25]
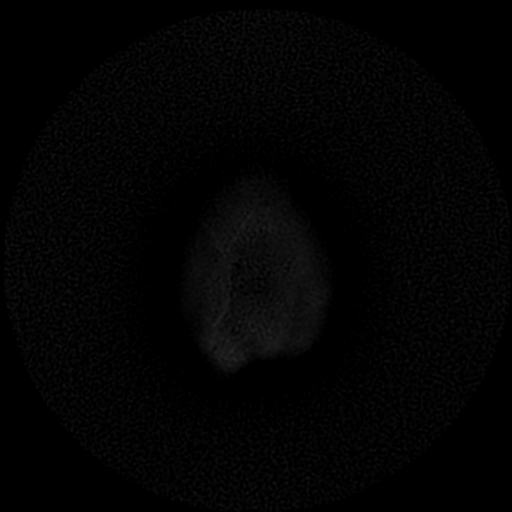

[Series 11: FLAIR · axial · 5.0mm · 0.43mm/px · z∈[-96,+42]mm · 2 of 24 slices shown (2 of 2)]
[im 1/24]
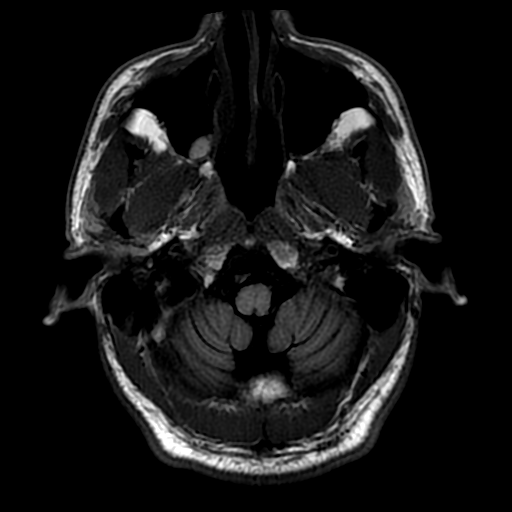
[im 24/24]
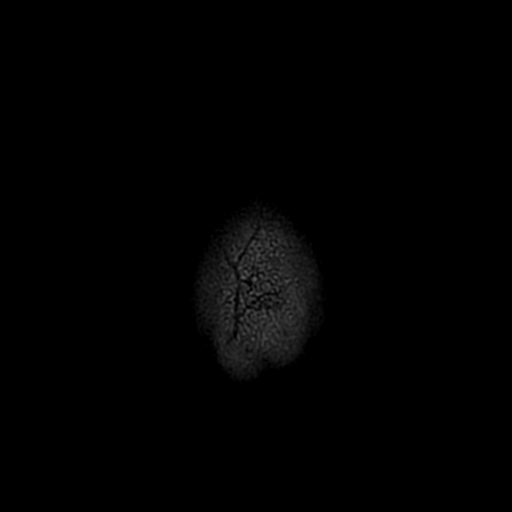

[Series 12: T2 · coronal · 5.0mm · 0.43mm/px · 3 of 28 slices shown (2 of 2)]
[im 1/28]
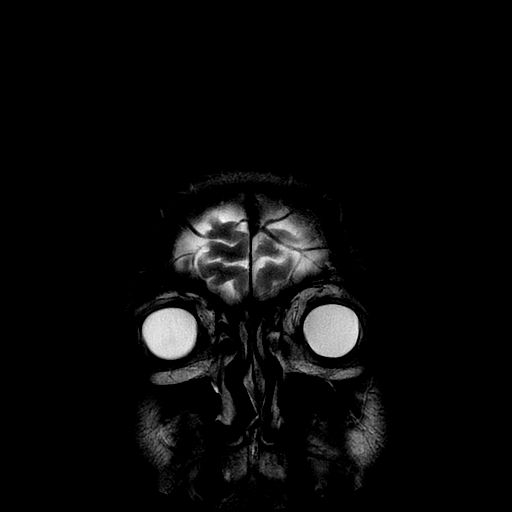
[im 14/28]
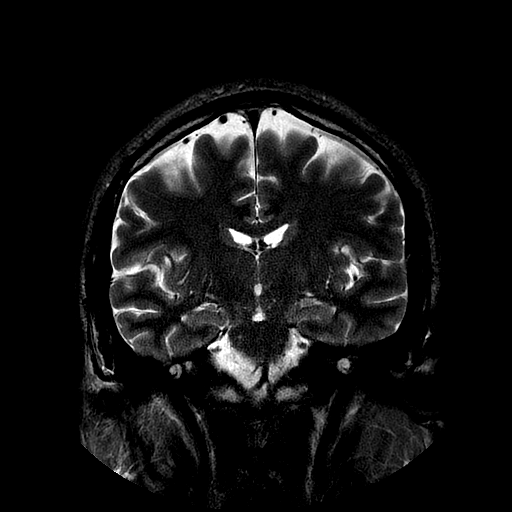
[im 28/28]
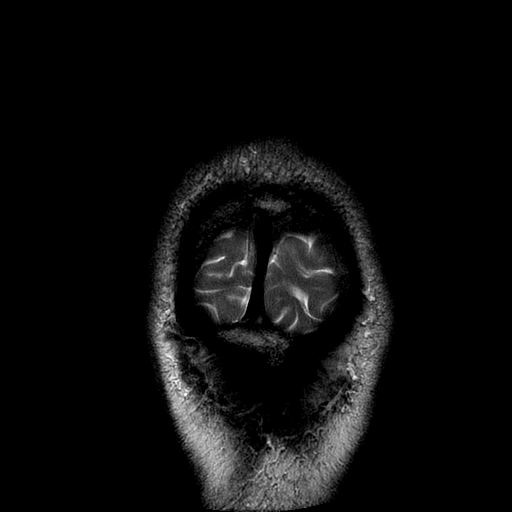

[Series 400: DWI · axial · 3.0mm · 1.09mm/px · z∈[-97,+35]mm · 4 of 45 slices shown (3 of 4)]
[im 1/45]
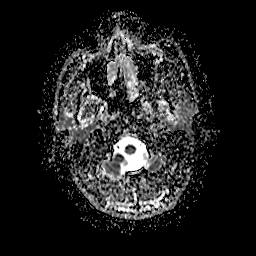
[im 15/45]
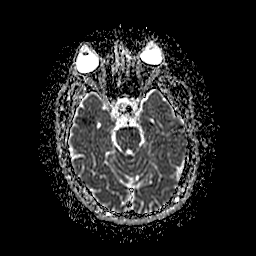
[im 30/45]
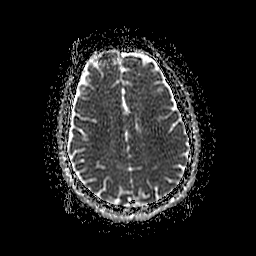
[im 45/45]
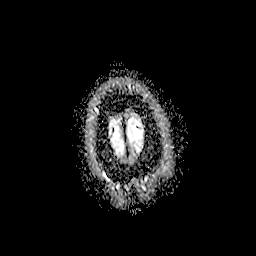

[Series 700: DWI · coronal · 5.0mm · 1.09mm/px · 3 of 33 slices shown (4 of 4)]
[im 1/33]
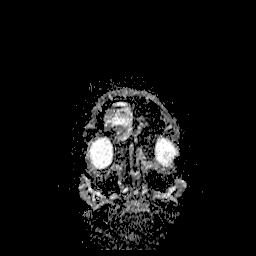
[im 17/33]
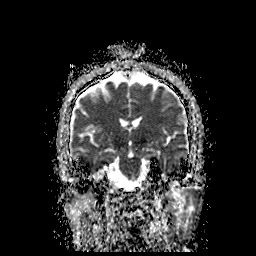
[im 33/33]
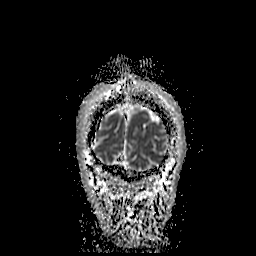

[33 of 48 positions shown; findings below may reference images not displayed]

FINDINGS: MRI HEAD FINDINGS

Brain: 10 x 6 mm focus of low diffusivity within the left posterior
limb of internal capsule (series 4, image 22). 3 mm focus of low
diffusivity within the right temporal operculum (Series 4, image
19). No additional diffusion signal abnormality.

T2 hyperintense focus in the right hemi pons. No focal mass effect,
intracranial hemorrhage, hydrocephalus, or extra-axial collection.

Vascular: As below.

Skull and upper cervical spine: Normal marrow signal.

Sinuses/Orbits: Mild anterior ethmoid sinus mucosal thickening and
small mucous retention cyst in the right maxillary sinus. No
abnormal signal of mastoid air cells. Orbits are unremarkable.

Other: Right superolateral low signal dermal lesion overlying
parietal bone measuring up to 10 mm (series 10, image 12). Direct
visualization recommended.

MRA HEAD FINDINGS

Internal carotid arteries:  Patent.

Anterior cerebral arteries:  Patent.

Middle cerebral arteries: Patent.

Anterior communicating artery: Patent.

Posterior communicating arteries: Small patent left. No right
identified, likely hypoplastic or absent.

Posterior cerebral arteries:  Patent.

Basilar artery:  Patent.

Vertebral arteries:  Patent.

No evidence of high-grade stenosis, large vessel occlusion, or
aneurysm.
IMPRESSION: 1. Small acute infarct within the left posterior limb of internal
capsule and punctate acute infarct in the right temporal operculum.
No associated hemorrhage. Different vascular territories suggests
embolic etiology.
2. T2 hyperintense focus in right hemi pons, possibly chronic
lacunar infarct or prominent perivascular space.
3. Mild paranasal sinus disease.
4. Dermal lesion in right superolateral scalp overlying parietal
bone, direct visualization recommended.
5. Normal MRA of the head without contrast.
These results will be called to the ordering clinician or
representative by the Radiologist Assistant, and communication
documented in the PACS or zVision Dashboard.

By: Akabogu Biachi M.D.

## 2018-06-19 ENCOUNTER — Ambulatory Visit: Payer: Self-pay | Admitting: Internal Medicine

## 2018-08-03 ENCOUNTER — Encounter: Payer: 59 | Attending: Physical Medicine & Rehabilitation

## 2018-08-03 ENCOUNTER — Other Ambulatory Visit: Payer: Self-pay

## 2018-08-03 ENCOUNTER — Encounter: Payer: Self-pay | Admitting: Physical Medicine & Rehabilitation

## 2018-08-03 ENCOUNTER — Ambulatory Visit (HOSPITAL_BASED_OUTPATIENT_CLINIC_OR_DEPARTMENT_OTHER): Payer: 59 | Admitting: Physical Medicine & Rehabilitation

## 2018-08-03 VITALS — Ht 70.0 in | Wt 179.0 lb

## 2018-08-03 DIAGNOSIS — G541 Lumbosacral plexus disorders: Secondary | ICD-10-CM

## 2018-08-03 DIAGNOSIS — I69398 Other sequelae of cerebral infarction: Secondary | ICD-10-CM | POA: Diagnosis not present

## 2018-08-03 DIAGNOSIS — R269 Unspecified abnormalities of gait and mobility: Secondary | ICD-10-CM

## 2018-08-03 NOTE — Progress Notes (Signed)
Subjective:    Patient ID: Darrell Watson, male    DOB: October 14, 1960, 58 y.o.   MRN: [id number] WebEx visit  July 03, 2016, with right-sided weakness and facial droop with slurred speech. Cranial CT scan negative. 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 MRA negative. The patient did not receive tPA. Echocardiogram with ejection fraction of 50%, no wall motion abnormalities, no emboli identified. Carotid Dopplers, no ICA stenosis. Findings of significant decrease in hemoglobin 15.2-8.3. Recent back surgery. A CT of abdomen and pelvis showed intramuscular hematoma of the left rectus muscle. 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 General activity 0 Relation with others 0 Enjoyment of life 0 What TIME of day is your pain at its worst? no pain Sleep (in general) varies  Pain is worse with: no pain Pain improves with: no pain Relief from Meds: no pain  Mobility walk without assistance ability to climb steps?  yes do you drive?  yes  Function employed # of hrs/week 40  Neuro/Psych weakness trouble walking dizziness  Prior Studies Any changes since last visit?  no  Physicians involved in your care Any changes since last visit?  no   History reviewed. No pertinent family history. Social History   Socioeconomic History  . Marital status: Married    Spouse name: Not on file  . Number of children: Not on  file  . Years of education: Not on file  . Highest education level: Not on file  Occupational History  . Not on file  Social Needs  . Financial resource strain: Not on file  . Food insecurity:    Worry: Not on file    Inability: Not on file  . Transportation needs:    Medical: Not on file    Non-medical: Not on file  Tobacco Use  . Smoking status: Never Smoker  . Smokeless tobacco: Never Used  Substance and Sexual Activity  . Alcohol use: Yes    Comment: BEER  OCC  . Drug use: No  . Sexual activity: Not on file  Lifestyle  . Physical activity:    Days per week: Not on file    Minutes per session: Not on file  . Stress: Not on file  Relationships  . Social connections:    Talks on phone: Not on file    Gets together: Not on file    Attends religious service: Not on file    Active member of club or organization: Not on file    Attends meetings of clubs or organizations: Not on file    Relationship status: Not on file  Other Topics Concern  . Not on file  Social History Narrative  . Not on file   Past Surgical History:  Procedure Laterality Date  . ABDOMINAL EXPOSURE N/A 06/27/2016   Procedure: ABDOMINAL EXPOSURE;  Surgeon: Chuck Hint, MD;  Location: [facility] OR;  Service: Vascular;  Laterality: N/A;  . ANTERIOR LUMBAR FUSION N/A 06/27/2016   Procedure: ANTERIOR LUMBAR FUSION L5-S1;  Surgeon: Venita Lick, MD;  Location: [facility] OR;  Service: Orthopedics;  Laterality: N/A;  Requests 3 hours  . FINGER AMPUTATION Left   . SHOULDER SURGERY     Past Medical History:  Diagnosis Date  . Anemia   . Arthritis   . Stroke (HCC)   . Tachycardia    Ht 5\' 10"  (1.778 m)   Wt 179 lb (81.2 kg)   BMI 25.68 kg/m   Opioid Risk Score:   Fall Risk Score:  `1  Depression screen PHQ 2/9  Depression screen [facility] 2/9 11/18/2017 03/17/2017 08/02/2016  Decreased Interest 0 0 0  Down, Depressed, Hopeless 0 0 0  PHQ - 2 Score 0 0 0  Altered sleeping - - 0  Tired, decreased energy - - 0   Change in appetite - - 0  Feeling bad or failure about yourself  - - 0  Trouble concentrating - - 0  Moving slowly or fidgety/restless - - 0  Suicidal thoughts - - 0  PHQ-9 Score - - 0  Difficult doing work/chores - - Not difficult at all    Review of Systems  Constitutional: Negative.   HENT: Negative.   Eyes: Negative.   Respiratory: Negative.   Cardiovascular: Negative.   Gastrointestinal: Negative.   Endocrine:       Diabetic   Genitourinary: Negative.   Musculoskeletal: Positive for gait problem.  Skin: Negative.   Allergic/Immunologic: Negative.   Neurological: Positive for dizziness and light-headedness.  Hematological: Negative.   Psychiatric/Behavioral: Negative.   All other systems reviewed and are negative.      Objective:   Physical Exam  Unable to examine due to virtual visit Speech is without dysarthria Mood and affect without lability or agitation noted     Assessment & Plan:  1.  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  His work has been cut back somewhat due to the coronavirus. He continues have problem with normal ambulatory speed and does qualify for handicapped placard for his automobile. We discussed that he will come back to see me on a as needed basis.  If he needs a renewal of his handicap placard he could potentially get this from his primary physician

## 2018-09-14 ENCOUNTER — Encounter: Payer: 59 | Attending: Physical Medicine & Rehabilitation | Admitting: Physical Medicine & Rehabilitation

## 2018-09-14 ENCOUNTER — Encounter: Payer: Self-pay | Admitting: Physical Medicine & Rehabilitation

## 2018-09-14 ENCOUNTER — Other Ambulatory Visit: Payer: Self-pay

## 2018-09-14 ENCOUNTER — Encounter (INDEPENDENT_AMBULATORY_CARE_PROVIDER_SITE_OTHER): Payer: Self-pay

## 2018-09-14 VITALS — BP 110/76 | HR 81 | Temp 98.2°F | Ht 70.0 in | Wt 184.0 lb

## 2018-09-14 DIAGNOSIS — Z89022 Acquired absence of left finger(s): Secondary | ICD-10-CM | POA: Diagnosis not present

## 2018-09-14 DIAGNOSIS — R531 Weakness: Secondary | ICD-10-CM | POA: Diagnosis not present

## 2018-09-14 DIAGNOSIS — R5383 Other fatigue: Secondary | ICD-10-CM | POA: Diagnosis not present

## 2018-09-14 DIAGNOSIS — R262 Difficulty in walking, not elsewhere classified: Secondary | ICD-10-CM | POA: Diagnosis not present

## 2018-09-14 DIAGNOSIS — I69359 Hemiplegia and hemiparesis following cerebral infarction affecting unspecified side: Secondary | ICD-10-CM

## 2018-09-14 NOTE — Progress Notes (Signed)
Subjective:    Patient ID: Darrell Watson, male    DOB: 1960-07-25, 58 y.o.   MRN: [id number] July 03, 2016, with right-sided weakness and facial droop with slurred speech. Cranial CT scan negative. 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 MRA negative. The patient did not receive tPA. Echocardiogram with ejection fraction of 50%, no wall motion abnormalities, no emboli identified. Carotid Dopplers, no ICA stenosis. Findings of significant decrease in hemoglobin 15.2-8.3. Recent back surgery. 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 General activity 3 Relation with others 3 Enjoyment of life 2 What TIME of day is your pain at its worst? daytime Sleep (in general) Poor  Pain is worse with: walking, bending and standing Pain improves with: na Relief from Meds: 2  Mobility walk without assistance ability to climb steps?  yes do you drive?  yes  Function employed # of hrs/week 8  Neuro/Psych weakness trouble walking dizziness  Prior Studies Any changes since last visit?  no  Physicians involved in your care Any changes since last visit?  no   No family history on file. Social History   Socioeconomic History  . Marital status: Married    Spouse name: Not on file  . Number of children: Not on file  . Years of education: Not on file  . Highest education level: Not on file  Occupational History  . Not on file  Social Needs  . Financial resource strain: Not  on file  . Food insecurity:    Worry: Not on file    Inability: Not on file  . Transportation needs:    Medical: Not on file    Non-medical: Not on file  Tobacco Use  . Smoking status: Never Smoker  . Smokeless tobacco: Never Used  Substance and Sexual Activity  . Alcohol use: Yes    Comment: BEER  OCC  . Drug use: No  . Sexual activity: Not on file  Lifestyle  . Physical activity:    Days per week: Not on file    Minutes per session: Not on file  . Stress: Not on file  Relationships  . Social connections:    Talks on phone: Not on file    Gets together: Not on file    Attends religious service: Not on file    Active member of club or organization: Not on file    Attends meetings of clubs or organizations: Not on file    Relationship status: Not on file  Other Topics Concern  . Not on file  Social History Narrative  . Not on file   Past Surgical History:  Procedure Laterality Date  . ABDOMINAL EXPOSURE N/A 06/27/2016   Procedure: ABDOMINAL EXPOSURE;  Surgeon: Chuck Hint, MD;  Location: [facility] OR;  Service: Vascular;  Laterality: N/A;  . ANTERIOR LUMBAR FUSION N/A 06/27/2016   Procedure: ANTERIOR LUMBAR FUSION L5-S1;  Surgeon: Venita Lick, MD;  Location: [facility] OR;  Service: Orthopedics;  Laterality: N/A;  Requests 3 hours  . FINGER AMPUTATION Left   . SHOULDER SURGERY     Past Medical History:  Diagnosis Date  . Anemia   . Arthritis   . Stroke (HCC)   . Tachycardia    BP 110/76   Pulse 81   Temp 98.2 F (36.8 C)   Ht 5\' 10"  (1.778 m)   Wt 184 lb (83.5 kg)   SpO2 98%   BMI 26.40 kg/m   Opioid Risk Score:   Fall Risk Score:  `1  Depression screen PHQ 2/9  [facility] 11/18/2017 03/17/2017 08/02/2016  Decreased Interest 0 0 0  Down, Depressed, Hopeless 0 0 0  PHQ - 2 Score 0 0 0  Altered sleeping - - 0  Tired, decreased energy - - 0  Change in appetite - - 0  Feeling bad or failure about yourself  - - 0  Trouble concentrating - - 0   Moving slowly or fidgety/restless - - 0  Suicidal thoughts - - 0  PHQ-9 Score - - 0  Difficult doing work/chores - - Not difficult at all     Review of Systems  Constitutional: Positive for fatigue and unexpected weight change.  HENT: Negative.   Eyes: Negative.   Respiratory: Negative.   Cardiovascular: Negative.   Gastrointestinal: Negative.   Endocrine: Negative.   Genitourinary: Negative.   Musculoskeletal: Positive for gait problem.  Skin: Negative.   Allergic/Immunologic: Negative.   Neurological: Positive for dizziness and weakness.  Hematological: Negative.   Psychiatric/Behavioral: Negative.   All other systems reviewed and are negative.      Objective:   Physical Exam RLE 4/5 HF, KE, ADF, PF, as wel as Left 4/5 HF, KE   Sensation intact to pp DTRs normal in lower extremities  Ambulates without assistive device he has a stiff leg gait on the right side.  Mood and affect are appropriate Speech without evidence of dysarthria  Muscle bulk is equal bilateral quadriceps    Assessment & Plan:  1.  History of left posterior limb of internal capsule with residual right lower extremity weakness.  We discussed that his strength is actually good his motor control is likely was causing his gait disorder.  In addition his fatigue may be a result of his mild lower extremity weakness combined with impaired walking biomechanics and residual effects of CVA. We discussed that lower extremity strengthening would be helpful to some degree for his fatigue and lower extremity complaints although he would not be able to get back to baseline. 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 Patient is considering early retirement at age 16-1/2.  He will return to clinic on a as needed basis.

## 2018-09-14 NOTE — Patient Instructions (Signed)
Would rec pt start doing body weight exercise for leg strengthening

## 2019-04-09 DIAGNOSIS — I10 Essential (primary) hypertension: Secondary | ICD-10-CM | POA: Diagnosis not present

## 2019-04-09 DIAGNOSIS — Z0000 Encounter for general adult medical examination without abnormal findings: Secondary | ICD-10-CM | POA: Diagnosis not present

## 2019-04-09 DIAGNOSIS — D860 Sarcoidosis of lung: Secondary | ICD-10-CM | POA: Diagnosis not present

## 2019-04-09 DIAGNOSIS — N529 Male erectile dysfunction, unspecified: Secondary | ICD-10-CM | POA: Diagnosis not present

## 2019-04-09 DIAGNOSIS — E1169 Type 2 diabetes mellitus with other specified complication: Secondary | ICD-10-CM | POA: Diagnosis not present

## 2019-04-09 DIAGNOSIS — Z13220 Encounter for screening for lipoid disorders: Secondary | ICD-10-CM | POA: Diagnosis not present

## 2019-04-09 DIAGNOSIS — I6340 Cerebral infarction due to embolism of unspecified cerebral artery: Secondary | ICD-10-CM | POA: Diagnosis not present

## 2019-04-12 DIAGNOSIS — Z03818 Encounter for observation for suspected exposure to other biological agents ruled out: Secondary | ICD-10-CM | POA: Diagnosis not present

## 2019-10-05 DIAGNOSIS — E1165 Type 2 diabetes mellitus with hyperglycemia: Secondary | ICD-10-CM | POA: Diagnosis not present

## 2019-10-05 DIAGNOSIS — E291 Testicular hypofunction: Secondary | ICD-10-CM | POA: Diagnosis not present

## 2019-10-05 DIAGNOSIS — E7800 Pure hypercholesterolemia, unspecified: Secondary | ICD-10-CM | POA: Diagnosis not present

## 2019-10-05 DIAGNOSIS — I6340 Cerebral infarction due to embolism of unspecified cerebral artery: Secondary | ICD-10-CM | POA: Diagnosis not present

## 2019-10-05 DIAGNOSIS — I10 Essential (primary) hypertension: Secondary | ICD-10-CM | POA: Diagnosis not present

## 2019-10-05 DIAGNOSIS — R350 Frequency of micturition: Secondary | ICD-10-CM | POA: Diagnosis not present

## 2019-10-05 DIAGNOSIS — D860 Sarcoidosis of lung: Secondary | ICD-10-CM | POA: Diagnosis not present

## 2019-11-18 DIAGNOSIS — H40003 Preglaucoma, unspecified, bilateral: Secondary | ICD-10-CM | POA: Diagnosis not present

## 2020-04-13 DIAGNOSIS — Z0000 Encounter for general adult medical examination without abnormal findings: Secondary | ICD-10-CM | POA: Diagnosis not present

## 2020-04-13 DIAGNOSIS — E1169 Type 2 diabetes mellitus with other specified complication: Secondary | ICD-10-CM | POA: Diagnosis not present

## 2020-04-13 DIAGNOSIS — E7800 Pure hypercholesterolemia, unspecified: Secondary | ICD-10-CM | POA: Diagnosis not present

## 2020-04-13 DIAGNOSIS — N529 Male erectile dysfunction, unspecified: Secondary | ICD-10-CM | POA: Diagnosis not present

## 2020-04-13 DIAGNOSIS — Z125 Encounter for screening for malignant neoplasm of prostate: Secondary | ICD-10-CM | POA: Diagnosis not present

## 2020-04-13 DIAGNOSIS — I6340 Cerebral infarction due to embolism of unspecified cerebral artery: Secondary | ICD-10-CM | POA: Diagnosis not present

## 2020-04-13 DIAGNOSIS — I10 Essential (primary) hypertension: Secondary | ICD-10-CM | POA: Diagnosis not present

## 2020-06-06 DIAGNOSIS — R972 Elevated prostate specific antigen [PSA]: Secondary | ICD-10-CM | POA: Diagnosis not present

## 2020-06-06 DIAGNOSIS — N400 Enlarged prostate without lower urinary tract symptoms: Secondary | ICD-10-CM | POA: Diagnosis not present

## 2020-08-15 DIAGNOSIS — N529 Male erectile dysfunction, unspecified: Secondary | ICD-10-CM | POA: Diagnosis not present

## 2020-08-15 DIAGNOSIS — I10 Essential (primary) hypertension: Secondary | ICD-10-CM | POA: Diagnosis not present

## 2020-08-15 DIAGNOSIS — E1169 Type 2 diabetes mellitus with other specified complication: Secondary | ICD-10-CM | POA: Diagnosis not present

## 2020-08-15 DIAGNOSIS — I6340 Cerebral infarction due to embolism of unspecified cerebral artery: Secondary | ICD-10-CM | POA: Diagnosis not present

## 2020-08-15 DIAGNOSIS — E7800 Pure hypercholesterolemia, unspecified: Secondary | ICD-10-CM | POA: Diagnosis not present

## 2020-12-25 DIAGNOSIS — D123 Benign neoplasm of transverse colon: Secondary | ICD-10-CM | POA: Diagnosis not present

## 2020-12-25 DIAGNOSIS — K5289 Other specified noninfective gastroenteritis and colitis: Secondary | ICD-10-CM | POA: Diagnosis not present

## 2020-12-25 DIAGNOSIS — K648 Other hemorrhoids: Secondary | ICD-10-CM | POA: Diagnosis not present

## 2020-12-25 DIAGNOSIS — Z1211 Encounter for screening for malignant neoplasm of colon: Secondary | ICD-10-CM | POA: Diagnosis not present

## 2020-12-25 DIAGNOSIS — K635 Polyp of colon: Secondary | ICD-10-CM | POA: Diagnosis not present

## 2020-12-25 DIAGNOSIS — K5730 Diverticulosis of large intestine without perforation or abscess without bleeding: Secondary | ICD-10-CM | POA: Diagnosis not present

## 2020-12-25 DIAGNOSIS — D125 Benign neoplasm of sigmoid colon: Secondary | ICD-10-CM | POA: Diagnosis not present

## 2020-12-25 DIAGNOSIS — K6289 Other specified diseases of anus and rectum: Secondary | ICD-10-CM | POA: Diagnosis not present

## 2021-01-18 ENCOUNTER — Other Ambulatory Visit: Payer: 59

## 2021-04-03 DIAGNOSIS — K52832 Lymphocytic colitis: Secondary | ICD-10-CM | POA: Diagnosis not present

## 2021-08-02 DIAGNOSIS — Z125 Encounter for screening for malignant neoplasm of prostate: Secondary | ICD-10-CM | POA: Diagnosis not present

## 2021-08-02 DIAGNOSIS — Z0000 Encounter for general adult medical examination without abnormal findings: Secondary | ICD-10-CM | POA: Diagnosis not present

## 2021-08-02 DIAGNOSIS — I10 Essential (primary) hypertension: Secondary | ICD-10-CM | POA: Diagnosis not present

## 2021-08-02 DIAGNOSIS — E7800 Pure hypercholesterolemia, unspecified: Secondary | ICD-10-CM | POA: Diagnosis not present

## 2021-08-02 DIAGNOSIS — E1169 Type 2 diabetes mellitus with other specified complication: Secondary | ICD-10-CM | POA: Diagnosis not present

## 2021-08-02 DIAGNOSIS — I6340 Cerebral infarction due to embolism of unspecified cerebral artery: Secondary | ICD-10-CM | POA: Diagnosis not present
